# Patient Record
Sex: Male | Born: 1970 | Race: Black or African American | Hispanic: No | Marital: Single | Smoking: Former smoker
Health system: Southern US, Community
[De-identification: ages and names within clinical notes are randomized; demographics above are authoritative.]

## PROBLEM LIST (undated history)

## (undated) DIAGNOSIS — Z91199 Patient's noncompliance with other medical treatment and regimen due to unspecified reason: Secondary | ICD-10-CM

## (undated) DIAGNOSIS — I10 Essential (primary) hypertension: Secondary | ICD-10-CM

## (undated) DIAGNOSIS — I639 Cerebral infarction, unspecified: Secondary | ICD-10-CM

## (undated) DIAGNOSIS — N184 Chronic kidney disease, stage 4 (severe): Secondary | ICD-10-CM

## (undated) DIAGNOSIS — E119 Type 2 diabetes mellitus without complications: Secondary | ICD-10-CM

## (undated) HISTORY — PX: OTHER SURGICAL HISTORY: SHX169

## (undated) HISTORY — PX: AMPUTATION TOE: SHX6595

## (undated) HISTORY — PX: DG GREAT TOE RIGHT FOOT: HXRAD1657

---

## 2016-02-19 HISTORY — PX: ROTATOR CUFF REPAIR: SHX139

## 2021-02-17 ENCOUNTER — Emergency Department (HOSPITAL_COMMUNITY): Payer: Medicare HMO

## 2021-02-17 ENCOUNTER — Inpatient Hospital Stay (HOSPITAL_COMMUNITY)
Admission: EM | Admit: 2021-02-17 | Discharge: 2021-02-23 | DRG: 064 | Disposition: A | Discharge status: Rehabilitation Facility | Payer: Medicare HMO | Attending: Neurology | Admitting: Neurology

## 2021-02-17 DIAGNOSIS — I10 Essential (primary) hypertension: Secondary | ICD-10-CM | POA: Diagnosis present

## 2021-02-17 DIAGNOSIS — I619 Nontraumatic intracerebral hemorrhage, unspecified: Secondary | ICD-10-CM | POA: Diagnosis present

## 2021-02-17 DIAGNOSIS — I672 Cerebral atherosclerosis: Secondary | ICD-10-CM | POA: Diagnosis present

## 2021-02-17 DIAGNOSIS — I161 Hypertensive emergency: Secondary | ICD-10-CM

## 2021-02-17 DIAGNOSIS — E785 Hyperlipidemia, unspecified: Secondary | ICD-10-CM | POA: Diagnosis present

## 2021-02-17 DIAGNOSIS — R29711 NIHSS score 11: Secondary | ICD-10-CM | POA: Diagnosis present

## 2021-02-17 DIAGNOSIS — I611 Nontraumatic intracerebral hemorrhage in hemisphere, cortical: Secondary | ICD-10-CM | POA: Diagnosis present

## 2021-02-17 DIAGNOSIS — R4781 Slurred speech: Secondary | ICD-10-CM | POA: Diagnosis present

## 2021-02-17 DIAGNOSIS — G8194 Hemiplegia, unspecified affecting left nondominant side: Secondary | ICD-10-CM | POA: Diagnosis present

## 2021-02-17 DIAGNOSIS — I610 Nontraumatic intracerebral hemorrhage in hemisphere, subcortical: Principal | ICD-10-CM | POA: Diagnosis present

## 2021-02-17 DIAGNOSIS — I618 Other nontraumatic intracerebral hemorrhage: Secondary | ICD-10-CM | POA: Diagnosis not present

## 2021-02-17 DIAGNOSIS — E118 Type 2 diabetes mellitus with unspecified complications: Secondary | ICD-10-CM | POA: Diagnosis present

## 2021-02-17 DIAGNOSIS — Z20822 Contact with and (suspected) exposure to covid-19: Secondary | ICD-10-CM | POA: Diagnosis present

## 2021-02-17 DIAGNOSIS — R29810 Facial weakness: Secondary | ICD-10-CM | POA: Diagnosis present

## 2021-02-17 DIAGNOSIS — Z8673 Personal history of transient ischemic attack (TIA), and cerebral infarction without residual deficits: Secondary | ICD-10-CM

## 2021-02-17 DIAGNOSIS — I69152 Hemiplegia and hemiparesis following nontraumatic intracerebral hemorrhage affecting left dominant side: Secondary | ICD-10-CM | POA: Diagnosis not present

## 2021-02-17 DIAGNOSIS — G936 Cerebral edema: Secondary | ICD-10-CM | POA: Diagnosis present

## 2021-02-17 HISTORY — DX: Essential (primary) hypertension: I10

## 2021-02-17 HISTORY — DX: Cerebral infarction, unspecified: I63.9

## 2021-02-17 HISTORY — DX: Type 2 diabetes mellitus without complications: E11.9

## 2021-02-17 HISTORY — DX: Patient's noncompliance with other medical treatment and regimen due to unspecified reason: Z91.199

## 2021-02-17 HISTORY — DX: Chronic kidney disease, stage 4 (severe): N18.4

## 2021-02-17 LAB — CBC
HCT: 42 % (ref 39.0–52.0)
Hemoglobin: 14.1 g/dL (ref 13.0–17.0)
MCH: 31.4 pg (ref 26.0–34.0)
MCHC: 33.6 g/dL (ref 30.0–36.0)
MCV: 93.5 fL (ref 80.0–100.0)
Platelets: 316 10*3/uL (ref 150–400)
RBC: 4.49 MIL/uL (ref 4.22–5.81)
RDW: 11.9 % (ref 11.5–15.5)
WBC: 6.2 10*3/uL (ref 4.0–10.5)
nRBC: 0 % (ref 0.0–0.2)

## 2021-02-17 LAB — RESP PANEL BY RT-PCR (FLU A&B, COVID) ARPGX2
Influenza A by PCR: NEGATIVE
Influenza B by PCR: NEGATIVE
SARS Coronavirus 2 by RT PCR: NEGATIVE

## 2021-02-17 LAB — RAPID URINE DRUG SCREEN, HOSP PERFORMED
Amphetamines: NOT DETECTED
Barbiturates: NOT DETECTED
Benzodiazepines: NOT DETECTED
Cocaine: NOT DETECTED
Opiates: NOT DETECTED
Tetrahydrocannabinol: NOT DETECTED

## 2021-02-17 LAB — DIFFERENTIAL
Abs Immature Granulocytes: 0.02 10*3/uL (ref 0.00–0.07)
Basophils Absolute: 0 10*3/uL (ref 0.0–0.1)
Basophils Relative: 0 %
Eosinophils Absolute: 0 10*3/uL (ref 0.0–0.5)
Eosinophils Relative: 0 %
Immature Granulocytes: 0 %
Lymphocytes Relative: 18 %
Lymphs Abs: 1.1 10*3/uL (ref 0.7–4.0)
Monocytes Absolute: 0.4 10*3/uL (ref 0.1–1.0)
Monocytes Relative: 6 %
Neutro Abs: 4.7 10*3/uL (ref 1.7–7.7)
Neutrophils Relative %: 76 %

## 2021-02-17 LAB — ETHANOL: Alcohol, Ethyl (B): <10 mg/dL (ref <10)

## 2021-02-17 LAB — COMPREHENSIVE METABOLIC PANEL
ALT: 13 U/L (ref 0–44)
AST: 21 U/L (ref 15–41)
Albumin: 3.9 g/dL (ref 3.5–5.0)
Alkaline Phosphatase: 50 U/L (ref 38–126)
Anion gap: 8 (ref 5–15)
BUN: 47 mg/dL — ABNORMAL HIGH (ref 6–20)
CO2: 27 mmol/L (ref 22–32)
Calcium: 9.1 mg/dL (ref 8.9–10.3)
Chloride: 103 mmol/L (ref 98–111)
Creatinine, Ser: 4.58 mg/dL — ABNORMAL HIGH (ref 0.61–1.24)
GFR, Estimated: 15 mL/min — ABNORMAL LOW (ref >60)
Glucose, Bld: 130 mg/dL — ABNORMAL HIGH (ref 70–99)
Potassium: 3.8 mmol/L (ref 3.5–5.1)
Sodium: 138 mmol/L (ref 135–145)
Total Bilirubin: 1 mg/dL (ref 0.3–1.2)
Total Protein: 7.7 g/dL (ref 6.5–8.1)

## 2021-02-17 LAB — I-STAT CHEM 8, ED
BUN: 49 mg/dL — ABNORMAL HIGH (ref 6–20)
Calcium, Ion: 1.15 mmol/L (ref 1.15–1.40)
Chloride: 104 mmol/L (ref 98–111)
Creatinine, Ser: 4.4 mg/dL — ABNORMAL HIGH (ref 0.61–1.24)
Glucose, Bld: 121 mg/dL — ABNORMAL HIGH (ref 70–99)
HCT: 42 % (ref 39.0–52.0)
Hemoglobin: 14.3 g/dL (ref 13.0–17.0)
Potassium: 3.9 mmol/L (ref 3.5–5.1)
Sodium: 140 mmol/L (ref 135–145)
TCO2: 28 mmol/L (ref 22–32)

## 2021-02-17 LAB — APTT: aPTT: 27 seconds (ref 24–36)

## 2021-02-17 LAB — MRSA NEXT GEN BY PCR, NASAL: MRSA by PCR Next Gen: NOT DETECTED

## 2021-02-17 LAB — PROTIME-INR
INR: 0.8 (ref 0.8–1.2)
Prothrombin Time: 11.4 seconds (ref 11.4–15.2)

## 2021-02-17 LAB — CBG MONITORING, ED: Glucose-Capillary: 126 mg/dL — ABNORMAL HIGH (ref 70–99)

## 2021-02-17 MED ORDER — ACETAMINOPHEN 650 MG RE SUPP: 650.0000 mg | RECTAL | Status: DC | PRN

## 2021-02-17 MED ORDER — PANTOPRAZOLE SODIUM 40 MG IV SOLR: 40.0000 mg | Freq: Every day | INTRAVENOUS | Status: DC

## 2021-02-17 MED ORDER — ACETAMINOPHEN 160 MG/5ML PO SOLN: 650.0000 mg | ORAL | Status: DC | PRN

## 2021-02-17 MED ORDER — CLEVIDIPINE BUTYRATE 0.5 MG/ML IV EMUL
0.0000 mg/h | INTRAVENOUS | Status: DC
  Administered 2021-02-17: 1 mg/h via INTRAVENOUS
  Administered 2021-02-17: 22:00:00 21 mg/h via INTRAVENOUS
  Administered 2021-02-17: 18 mg/h via INTRAVENOUS
  Administered 2021-02-18: 01:00:00 14 mg/h via INTRAVENOUS
  Administered 2021-02-18: 09:00:00 8 mg/h via INTRAVENOUS
  Administered 2021-02-18: 13:00:00 12 mg/h via INTRAVENOUS
  Administered 2021-02-18: 10 mg/h via INTRAVENOUS
  Administered 2021-02-18: 16:00:00 7 mg/h via INTRAVENOUS
  Administered 2021-02-18: 12:00:00 12 mg/h via INTRAVENOUS
  Administered 2021-02-18: 14 mg/h via INTRAVENOUS
  Administered 2021-02-18: 19:00:00 6 mg/h via INTRAVENOUS
  Administered 2021-02-18: 14 mg/h via INTRAVENOUS
  Administered 2021-02-19 (×2): 5 mg/h via INTRAVENOUS
  Administered 2021-02-19 – 2021-02-20 (×3): 3 mg/h via INTRAVENOUS
  Administered 2021-02-20: 6 mg/h via INTRAVENOUS
  Filled 2021-02-17 (×19): qty 50

## 2021-02-17 MED ORDER — LABETALOL HCL 5 MG/ML IV SOLN
10.0000 mg | Freq: Once | INTRAVENOUS | Status: AC
  Administered 2021-02-17: 10 mg via INTRAVENOUS

## 2021-02-17 MED ORDER — ACETAMINOPHEN 325 MG PO TABS: 650.0000 mg | ORAL_TABLET | ORAL | Status: DC | PRN

## 2021-02-17 MED ORDER — SENNOSIDES-DOCUSATE SODIUM 8.6-50 MG PO TABS
1.0000 | ORAL_TABLET | Freq: Two times a day (BID) | ORAL | Status: DC
  Administered 2021-02-21 (×2): 1 via ORAL
  Filled 2021-02-17 (×5): qty 1

## 2021-02-17 MED ORDER — STROKE: EARLY STAGES OF RECOVERY BOOK
Freq: Once | Status: AC
  Administered 2021-02-18: 1
  Filled 2021-02-17: qty 1

## 2021-02-17 MED ORDER — SODIUM CHLORIDE 0.9% FLUSH: 3.0000 mL | Freq: Once | INTRAVENOUS | Status: DC

## 2021-02-17 MED ORDER — CLEVIDIPINE BUTYRATE 0.5 MG/ML IV EMUL: 0.0000 mg/h | INTRAVENOUS | Status: DC

## 2021-02-17 NOTE — ED Triage Notes (Signed)
Pt in as code stroke via [place], [place]. Per EMS, pt has hx of multiple CVA's (all care in other states), hx R side deficits. EMS states a bystander saw the pt lose balance and fall out of truck tonight. They called EMS, and pt had L sided weakness and L face droop, with slurred speech. Pt has HTN, but does not take any medications. BP 283'T systolic en route. 18G to LFA

## 2021-02-17 NOTE — ED Notes (Signed)
Report given via phone to [place], RN 4N

## 2021-02-17 NOTE — ED Provider Notes (Signed)
[facility] EMERGENCY DEPARTMENT Provider Note   CSN: [id number] Arrival date & time: 02/17/21  1934     History Chief Complaint  Patient presents with   Code Stroke    Carlos Rice is a 50 y.o. male.  HPI 50 year old male presents as a code stroke.  Initial history is obtained from EMS due to the code stroke nature.  Patient reportedly had a last known well of 6:30 PM.  Was in his car and felt off balance.  Utility worker saw him fall out of the car.  He felt acutely weak on his left side and was having difficulty speaking.  He states its like prior strokes and told EMS he has had 7 prior strokes.  Initial blood pressure around 250/160 and EMS reports the patient states that he does not take his blood pressure medicines or any medicines.  Patient tells me he primarily feels off balance. He denies unilateral weakness. He is having new slurred speech. He states he weaned himself off blood pressure medicines and uses "holistic" treatments.   No past medical history on file.  Patient Active Problem List   Diagnosis Date Noted   ICH (intracerebral hemorrhage) ([place]) 02/17/2021         No family history on file.     Home Medications Prior to Admission medications   Not on File    Allergies    Patient has no known allergies.  Review of Systems   Review of Systems  Musculoskeletal:  Positive for gait problem.  Neurological:  Positive for speech difficulty. Negative for weakness and headaches.  All other systems reviewed and are negative.  Physical Exam Updated Vital Signs BP (!) 221/117    Pulse 78    Temp 97.9 F (36.6 C)    Resp 18    Wt 85.4 kg    SpO2 100%   Physical Exam Vitals and nursing note reviewed.  Constitutional:      General: He is not in acute distress.    Appearance: He is well-developed. He is not ill-appearing or diaphoretic.  HENT:     Head: Normocephalic and atraumatic.     Right Ear: External ear normal.     Left Ear:  External ear normal.     Nose: Nose normal.  Eyes:     General:        Right eye: No discharge.        Left eye: No discharge.  Cardiovascular:     Rate and Rhythm: Normal rate and regular rhythm.     Heart sounds: Normal heart sounds.  Pulmonary:     Effort: Pulmonary effort is normal.     Breath sounds: Normal breath sounds.  Abdominal:     Palpations: Abdomen is soft.     Tenderness: There is no abdominal tenderness.  Musculoskeletal:     Cervical back: Neck supple.  Skin:    General: Skin is warm and dry.  Neurological:     Mental Status: He is alert.     Comments: Patient has slurred speech and a mild left facial droop.  Left upper extremity is weak compared to the right, 4/5.  Right upper, right lower and left lower extremity are all normal and strength.  Psychiatric:        Mood and Affect: Mood is not anxious.    ED Results / Procedures / Treatments   Labs (all labs ordered are listed, but only abnormal results are displayed) Labs Reviewed  I-STAT  CHEM 8, ED - Abnormal; Notable for the following components:      Result Value   BUN 49 (*)    Creatinine, Ser 4.40 (*)    Glucose, Bld 121 (*)    All other components within normal limits  CBG MONITORING, ED - Abnormal; Notable for the following components:   Glucose-Capillary 126 (*)    All other components within normal limits  RESP PANEL BY RT-PCR (FLU A&B, COVID) ARPGX2  CBC  DIFFERENTIAL  PROTIME-INR  APTT  COMPREHENSIVE METABOLIC PANEL  ETHANOL  HIV ANTIBODY (ROUTINE TESTING W REFLEX)  RAPID URINE DRUG SCREEN, HOSP PERFORMED    EKG None  Radiology CT HEAD CODE STROKE WO CONTRAST  Result Date: 02/17/2021 CLINICAL DATA:  Code stroke. Slurred speech and left-sided weakness. EXAM: CT HEAD WITHOUT CONTRAST TECHNIQUE: Contiguous axial images were obtained from the base of the skull through the vertex without intravenous contrast. COMPARISON:  None. FINDINGS: The study is mildly motion degraded. Brain: An  acute hemorrhage involving the right lentiform nucleus and external capsule measures 3.0 x 1.3 x 2.5 cm (estimated volume of 4.9 mL). There is minimal surrounding edema. No midline shift, acute cortically based infarct, or extra-axial fluid collection is identified. Patchy hypodensities in the cerebral white matter bilaterally are nonspecific but compatible with moderate chronic small vessel ischemic disease. The ventricles are normal in size. Vascular: Calcified atherosclerosis at the skull base. No hyperdense vessel. Skull: No fracture or suspicious osseous lesion. Sinuses/Orbits: Posterior left ethmoid air cell opacification. Clear mastoid air cells. Unremarkable orbits. Other: None. ASPECTS [facility] Stroke Program Early CT Score) Not scored due to the presence of acute hemorrhage. IMPRESSION: 1. Acute right basal ganglia hemorrhage. 2. Moderate chronic small vessel ischemic disease. These results were communicated to Dr. Cheral Marker at 8:00 pm on 02/17/2021 by text page via the [facility] messaging system. Electronically Signed   By: Logan Bores M.D.   On: 02/17/2021 20:01    Procedures .Critical Care Performed by: Sherwood Gambler, MD Authorized by: Sherwood Gambler, MD   Critical care provider statement:    Critical care time (minutes):  35   Critical care time was exclusive of:  Separately billable procedures and treating other patients   Critical care was necessary to treat or prevent imminent or life-threatening deterioration of the following conditions:  CNS failure or compromise   Critical care was time spent personally by me on the following activities:  Development of treatment plan with patient or surrogate, discussions with consultants, evaluation of patient's response to treatment, examination of patient, ordering and review of laboratory studies, ordering and review of radiographic studies, ordering and performing treatments and interventions, pulse oximetry, re-evaluation of patient's condition  and review of old charts   Medications Ordered in ED Medications  sodium chloride flush (NS) 0.9 % injection 3 mL (has no administration in time range)  labetalol (NORMODYNE) injection 10 mg (10 mg Intravenous Given 02/17/21 1933)    And  clevidipine (CLEVIPREX) infusion 0.5 mg/mL (4 mg/hr Intravenous Rate/Dose Change 02/17/21 2012)   stroke: mapping our early stages of recovery book (has no administration in time range)  acetaminophen (TYLENOL) tablet 650 mg (has no administration in time range)    Or  acetaminophen (TYLENOL) 160 MG/5ML solution 650 mg (has no administration in time range)    Or  acetaminophen (TYLENOL) suppository 650 mg (has no administration in time range)  senna-docusate (Senokot-S) tablet 1 tablet (has no administration in time range)  pantoprazole (PROTONIX) injection 40 mg (has no  administration in time range)    ED Course  I have reviewed the triage vital signs and the nursing notes.  Pertinent labs & imaging results that were available during my care of the patient were reviewed by me and considered in my medical decision making (see chart for details).    MDM Rules/Calculators/A&P                         Patient is awake and alert and protecting his airway.  CT head shows intraparenchymal hemorrhage of the basal ganglia.  This appears to be due to hypertension.  He was started on Cleviprex.  He has some left upper extremity weakness and some slurred speech.  Blood pressure has improved though is still elevated and he will need further treatment and close monitoring in the intensive care unit.  He does have some lab abnormalities including an elevated creatinine of 4.4, though his baseline is currently unknown as he has not been in our system before.  Dr. Cheral Marker was a part of the code stroke team and will admit to the neuro ICU.    Final Clinical Impression(s) / ED Diagnoses Final diagnoses:  Intraparenchymal hemorrhage of brain [facility])  Hypertensive  emergency    Rx / DC Orders ED Discharge Orders     None        Sherwood Gambler, MD 02/17/21 2102

## 2021-02-17 NOTE — H&P (Addendum)
NEURO HOSPITALIST HISTORY AND PHYSICAL   Requesting physician: Dr. Regenia Skeeter  Reason for Consult: Acute onset of left sided weakness  History obtained from:   Patient and Chart     HPI:                                                                                                                                          Carlos Rice is an 50 y.o. male with a PMH of uncontrolled HTN (noncompliant with medications), right toe amputation, prior stroke with right sided weakness in 2016 who presents to the hospital via EMS as a Code Stroke after he was noted to acutely be weak on his left side following a fall out of his car. The patient states that while getting out of his car, he suddenly felt unsteady and then was witnessed to fall to the ground. He states that when he tried to get back up, he was too weak on his left side to do so. EMS was called and he was brought to the ED. He was severely hypertensive en route, with a BP of 250/146. CBG was 147.   The patient states that when he was diagnosed with his first clinically apparent stroke in 2016, he was told that his MRI showed "7 strokes" including the new one that he presented with at that time.   He is not on any medications.   No past medical history on file.  No family history on file.           Social History:  has no history on file for tobacco use, alcohol use, and drug use.  Not on File  MEDICATIONS:                                                                                                                     Not on any medications  ROS:  As per HPI. Comprehensive ROS deferred due to acuity of presentation.    Weight 85.4 kg.  BP (!) 221/117    Pulse 78    Resp 18    Wt 85.4 kg    SpO2 100%     General Examination:                                                                                                        Physical Exam  HEENT-  [place]/AT   Lungs- Respirations unlabored Extremities- Toe amputation on the right. Sores noted to legs bilaterally.   Neurological Examination Mental Status: Awake and alert. No dysarthria. Speech fluent with intact naming and comprehension. Oriented x 5.  Cranial Nerves:  II: Temporal visual fields intact bilaterally. No extinction to DSS. PERRL. III,IV, VI: No ptosis. EOMI.   V: Temp sensation equal bilaterally VII: Left facial droop, lower quadrant.  VIII: Hearing intact to voice IX,X: No hypophonia or hoarseness XI: Weak shoulder shrug on the left XII: Midline tongue extension Motor: RUE: 5/5 proximally and distally RLE:5/5 proximally and distally LUE:3-4/5 proximally and distally LLE: 5/5 proximally and distally  Pronator drift is present on the left.  Sensory: FT intact in all 4 extremities. No extinction to DSS. Deep Tendon Reflexes: Hyperactive upper and lower extremity reflexes Plantars: M[place] on the left, amputated toe on the right Cerebellar: No ataxia with right FNF. Prominent dysmetria with left FNF. No ataxia with H-S bilaterally Gait: Deferred   Lab Results: Basic Metabolic Panel: No results for input(s): NA, K, CL, CO2, GLUCOSE, BUN, CREATININE, CALCIUM, MG, PHOS in the last 168 hours.  CBC: No results for input(s): WBC, NEUTROABS, HGB, HCT, MCV, PLT in the last 168 hours.  Cardiac Enzymes: No results for input(s): CKTOTAL, CKMB, CKMBINDEX, TROPONINI in the last 168 hours.  Lipid Panel: No results for input(s): CHOL, TRIG, HDL, CHOLHDL, VLDL, LDLCALC in the last 168 hours.  Imaging: No results found.   Assessment: 50 year old male with a PMHx of stroke, presenting acutely via EMS for assessment of abrupt onset left sided weakness 1. Exam reveals findings best localizable to the right MCA territory.  2. CT head reveals an acute hemorrhage involving the right lentiform nucleus and  external capsule measuring 3.0 x 1.3 x 2.5 cm (estimated volume of 4.9 mL). There is minimal surrounding edema. Patchy hypodensities in the cerebral white matter bilaterally are nonspecific but compatible with moderate chronic small vessel ischemic disease 3. ICH score: 0 4. Malignant HTN. The morphology and location of the [place] is most consistent with a hypertensive hemorrhage.   Recommendations: 1. Admit to ICU under Neurology service 2. MRI of head 3. CTA of head and neck 4. TTE 5. PT consult, OT consult, Speech consult 6. Cardiac telemetry 7. Frequent neuro checks 8. No antiplatelet medications or anticoagulants.  9. Repeat CT head in 12 hours to assess for stability 10. BP management with clevidipine drip   50 minutes spent in the emergent neurological evaluation and management of this critically ill patient.   Electronically signed: Dr. Kerney Elbe  02/17/2021, 7:45 PM

## 2021-02-17 NOTE — Progress Notes (Signed)
Spoke with Dr. Cheral Marker to clarify blood pressure parameters. Dr. Cheral Marker said parameters are SBP 130-150. Will continue to monitor.

## 2021-02-18 ENCOUNTER — Inpatient Hospital Stay (HOSPITAL_COMMUNITY): Payer: Medicare HMO

## 2021-02-18 ENCOUNTER — Other Ambulatory Visit: Payer: Self-pay

## 2021-02-18 DIAGNOSIS — I618 Other nontraumatic intracerebral hemorrhage: Secondary | ICD-10-CM

## 2021-02-18 LAB — LIPID PANEL
Cholesterol: 251 mg/dL — ABNORMAL HIGH (ref 0–200)
HDL: 104 mg/dL (ref >40)
LDL Cholesterol: 131 mg/dL — ABNORMAL HIGH (ref 0–99)
Total CHOL/HDL Ratio: 2.4 RATIO
Triglycerides: 82 mg/dL (ref <150)
VLDL: 16 mg/dL (ref 0–40)

## 2021-02-18 LAB — VITAMIN B12: Vitamin B-12: 901 pg/mL (ref 180–914)

## 2021-02-18 LAB — HIV ANTIBODY (ROUTINE TESTING W REFLEX): HIV Screen 4th Generation wRfx: NONREACTIVE

## 2021-02-18 LAB — IRON AND TIBC
Iron: 66 ug/dL (ref 45–182)
Saturation Ratios: 19 % (ref 17.9–39.5)
TIBC: 350 ug/dL (ref 250–450)
UIBC: 284 ug/dL

## 2021-02-18 LAB — HEMOGLOBIN A1C
Hgb A1c MFr Bld: 6.8 % — ABNORMAL HIGH (ref 4.8–5.6)
Mean Plasma Glucose: 148.46 mg/dL

## 2021-02-18 LAB — FERRITIN: Ferritin: 21 ng/mL — ABNORMAL LOW (ref 24–336)

## 2021-02-18 IMAGING — MR MR HEAD W/O CM
12 of 13 series · 39 of 48 positions shown · non-contrast
Comparison: Head CT [DATE]

CLINICAL DATA: Neuro deficit, acute, stroke suspected. Acute
left-sided weakness. Right basal ganglia hemorrhage on CT.



[Series 5: DWI · axial · 3.0mm · 0.88mm/px · z∈[-97,+44]mm · 6 of 96 slices shown (1 of 4)]
[im 1/96]
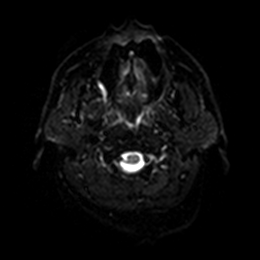
[im 20/96]
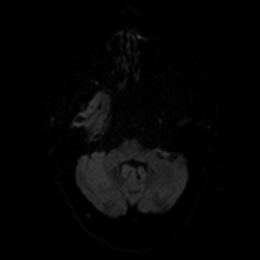
[im 39/96]
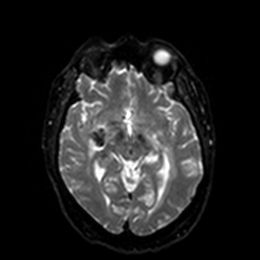
[im 58/96]
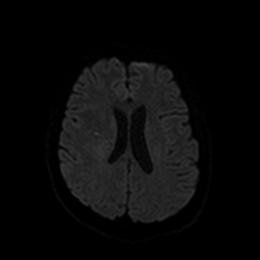
[im 77/96]
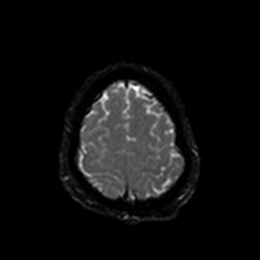
[im 96/96]
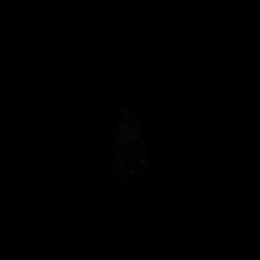

[Series 6: DWI · axial · 3.0mm · 0.88mm/px · z∈[-97,+44]mm · 4 of 48 slices shown (2 of 4)]
[im 1/48]
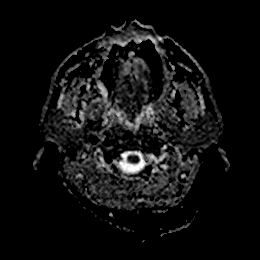
[im 16/48]
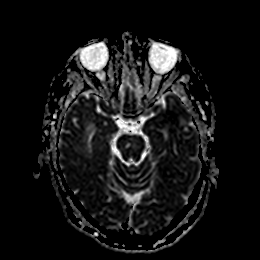
[im 32/48]
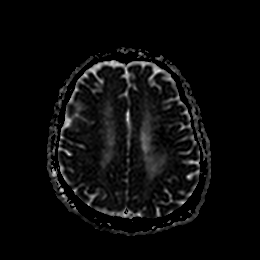
[im 48/48]
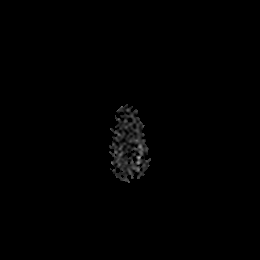

[Series 7: DWI · coronal · 4.0mm · 0.88mm/px · 5 of 64 slices shown (3 of 4)]
[im 1/64]
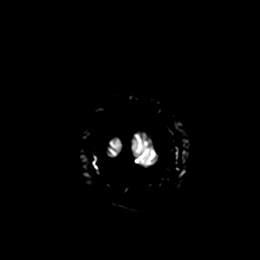
[im 16/64]
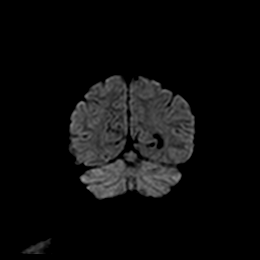
[im 32/64]
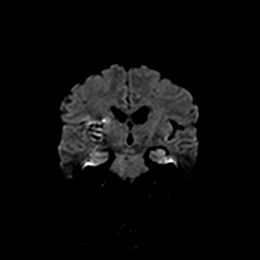
[im 48/64]
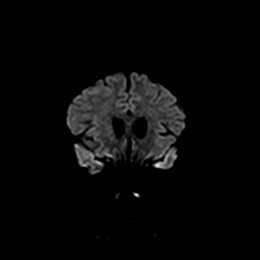
[im 64/64]
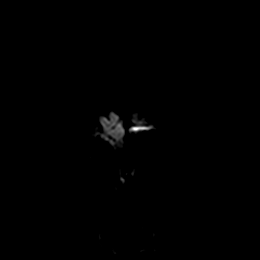

[Series 8: DWI · coronal · 4.0mm · 0.88mm/px · 2 of 32 slices shown (4 of 4)]
[im 1/32]
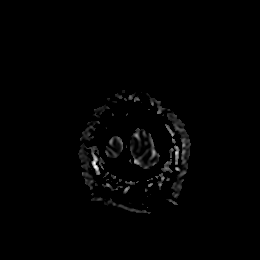
[im 32/32]
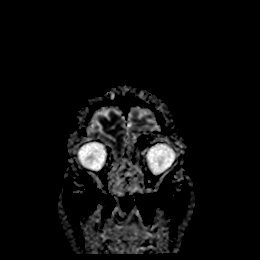

[Series 13: T1 · sagittal · 5.0mm · 0.75mm/px · 2 of 23 slices shown]
[im 1/23]
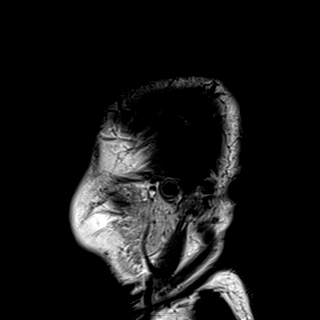
[im 23/23]
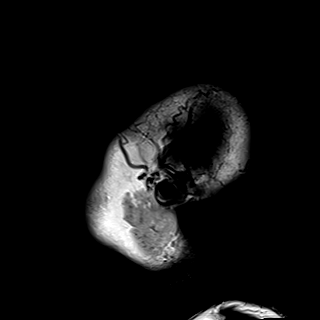

[Series 14: T2 · axial · 5.0mm · 0.72mm/px · z∈[-100,+37]mm · 2 of 24 slices shown (1 of 2)]
[im 1/24]
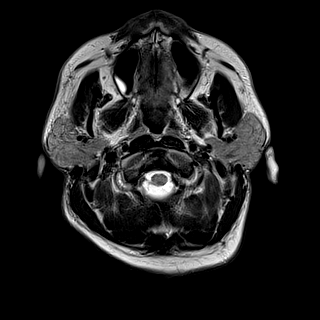
[im 24/24]
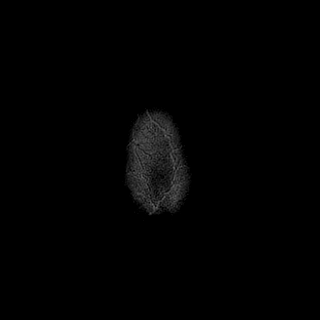

[Series 15: FLAIR · axial · 5.0mm · 0.45mm/px · z∈[-98,+39]mm · 2 of 24 slices shown (1 of 2)]
[im 1/24]
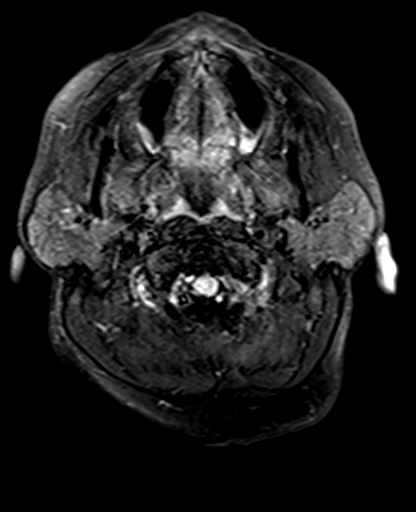
[im 24/24]
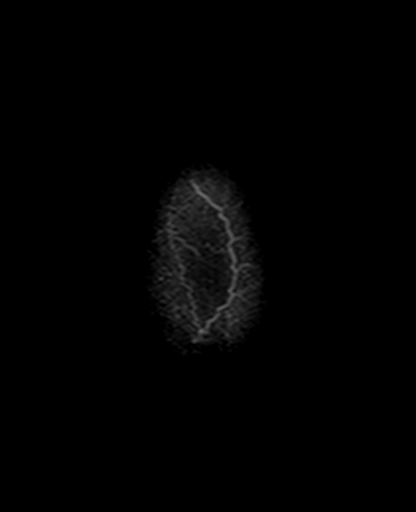

[Series 16: mag_images · axial · 3.0mm · 0.90mm/px · z∈[-100,+40]mm · 4 of 48 slices shown]
[im 1/48]
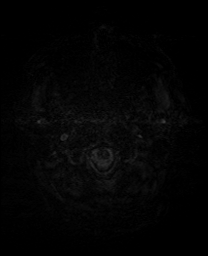
[im 16/48]
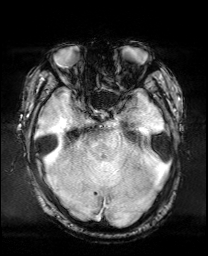
[im 32/48]
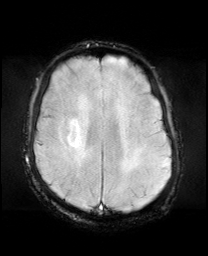
[im 48/48]
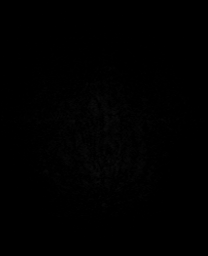

[Series 17: pha_images · axial · 3.0mm · 0.90mm/px · z∈[-100,+37]mm · 4 of 47 slices shown]
[im 1/47]
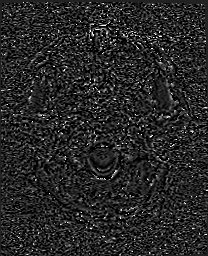
[im 16/47]
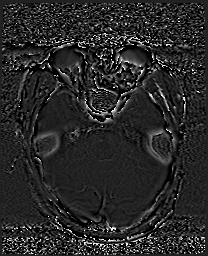
[im 31/47]
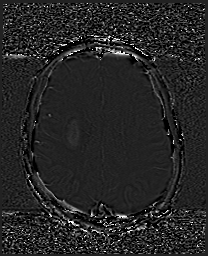
[im 47/47]
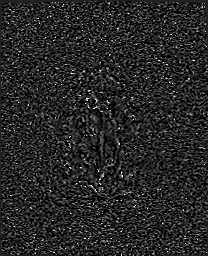

[Series 18: swi_images · axial · 3.0mm · 0.90mm/px · z∈[-100,+40]mm · 4 of 48 slices shown]
[im 1/48]
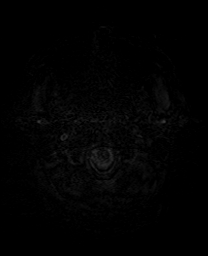
[im 16/48]
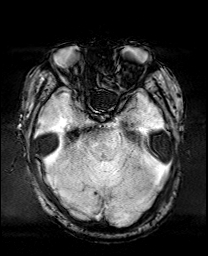
[im 32/48]
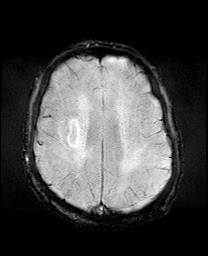
[im 48/48]
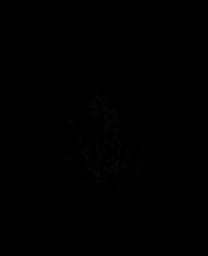

[Series 21: T2 · coronal · 5.0mm · 0.72mm/px · 2 of 27 slices shown (2 of 2)]
[im 1/27]
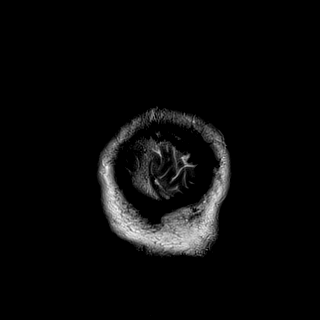
[im 27/27]
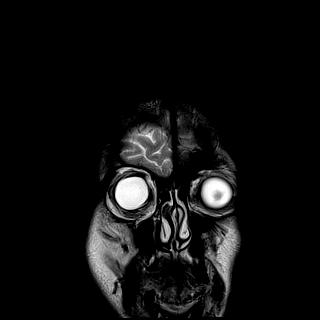

[Series 22: FLAIR · axial · 5.0mm · 0.90mm/px · z∈[-100,+37]mm · 2 of 24 slices shown (2 of 2)]
[im 1/24]
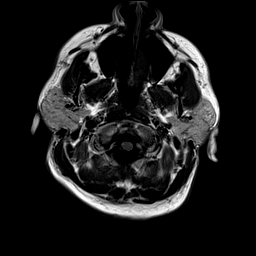
[im 24/24]
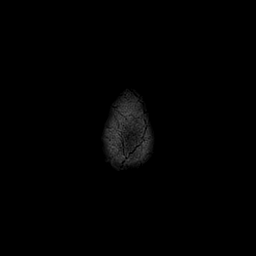

[39 of 48 positions shown; findings below may reference images not displayed]

FINDINGS: MRI HEAD FINDINGS

Brain: A 3.0 x 1.5 cm hemorrhage involving the right lentiform
nucleus and external capsule is similar in size to the recent CT.
There is mild surrounding edema with very mild local mass effect
including slight mass effect on the right lateral ventricle but no
midline shift. A separate 2 mm focus of restricted diffusion
posterolateral to the hemorrhage in the right parietal operculum may
represent a punctate acute infarct. Patchy T2 hyperintensities in
the cerebral white matter bilaterally and in the pons are
nonspecific but compatible with moderate chronic small vessel
ischemic disease. There are chronic lacunar infarcts in the basal
ganglia, left thalamus, and pons. No extra-axial fluid collection or
hydrocephalus is evident. Chronic microhemorrhages are noted in the
pons and right occipital lobe.

Vascular: Major intracranial vascular flow voids are preserved.

Skull and upper cervical spine: Unremarkable bone marrow signal.

Sinuses/Orbits: Unremarkable orbits. Posterior left ethmoid air cell
opacification. No significant mastoid fluid.

Other: None.

MRA HEAD FINDINGS

The included intracranial portions of the vertebral arteries are
widely patent to the basilar with the left being dominant. A patent
left PICA is partially visualized although its origin was not
imaged. A right PICA is not identified. Patent AICA and SCA origins
are seen bilaterally. The basilar artery is widely patent. Posterior
communicating arteries are diminutive or absent. Both PCAs are
patent without evidence of significant proximal stenosis.

The internal carotid arteries are patent from skull base to carotid
termini with a moderate paraclinoid stenosis noted on the right.
ACAs and MCAs are patent with mild branch vessel irregularity but
without evidence of a proximal branch occlusion or significant A1 or
M1 stenosis. The right A1 segment is absent. There is a moderate
stenosis of the right M2 superior division proximally. No aneurysm
is identified.

MRA NECK FINDINGS

Assessment is limited by noncontrast technique and mild motion
artifact. The aortic arch and proximal arch vessels were not imaged.
The included portions of the common carotid and cervical internal
carotid arteries are patent without evidence of a significant
stenosis.

The vertebral arteries are patent with antegrade flow bilaterally
and with the left vertebral artery being mildly to moderately
dominant. Motion artifact limits assessment of the right vertebral
artery origin, and a stenosis is not excluded. There is no evidence
of significant stenosis or dissection elsewhere involving either
vertebral artery in the neck.
IMPRESSION: 1. Unchanged 3 cm right basal ganglia hemorrhage.  Mild edema.
2. Possible nearby punctate acute infarct in the right parietal
operculum.
3. Moderate chronic small vessel ischemic disease with multiple
chronic lacunar infarcts.
4. Intracranial atherosclerosis with moderate right paraclinoid ICA
and right M2 stenoses. No large vessel occlusion.
5. Patent cervical carotid and vertebral arteries without evidence
of a significant stenosis other than at the right vertebral origin
which is not well evaluated due to motion.

## 2021-02-18 IMAGING — MR MR MRA NECK W/O CM
1 series · 36 of 48 positions shown · non-contrast
Comparison: Head CT [DATE]

CLINICAL DATA: Neuro deficit, acute, stroke suspected. Acute
left-sided weakness. Right basal ganglia hemorrhage on CT.



[Series 14: [id number]_iso · axial · 0.6mm · 0.52mm/px · z∈[-247,-119]mm · 36 of 226 slices shown]
[im 1/226]
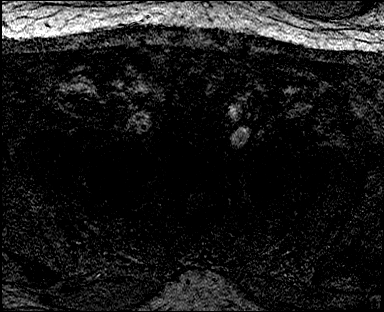
[im 5/226]
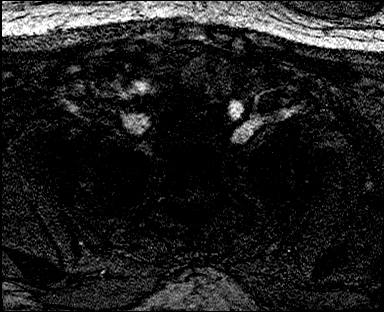
[im 10/226]
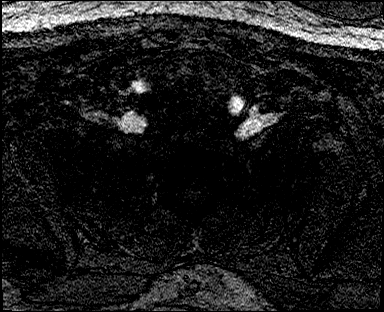
[im 15/226]
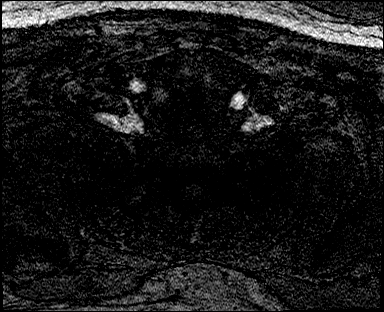
[im 20/226]
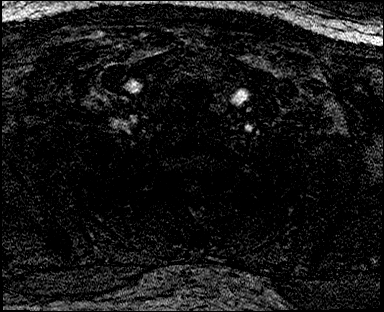
[im 24/226]
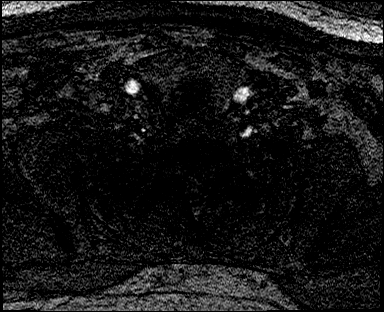
[im 29/226]
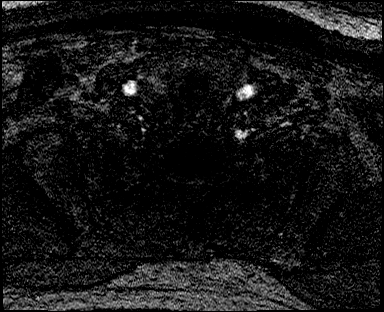
[im 34/226]
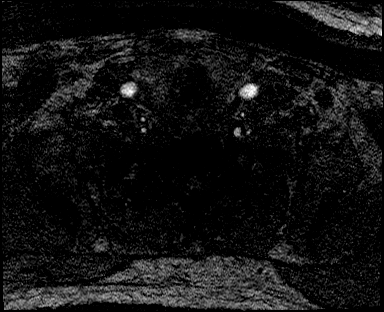
[im 39/226]
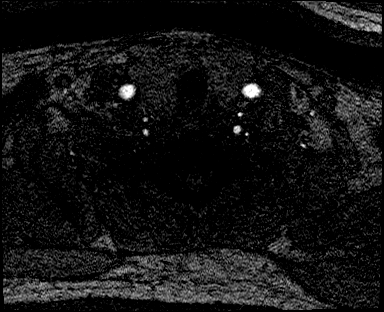
[im 44/226]
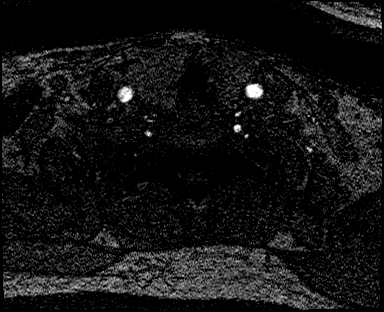
[im 48/226]
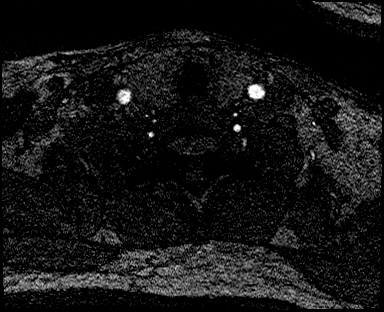
[im 53/226]
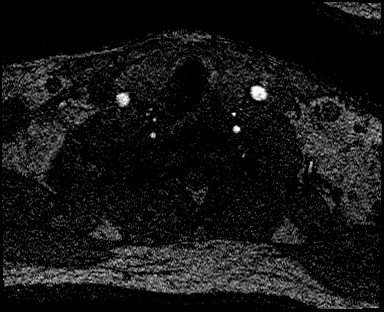
[im 58/226]
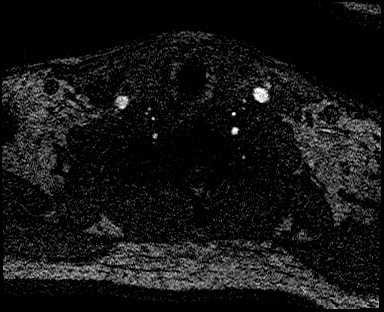
[im 63/226]
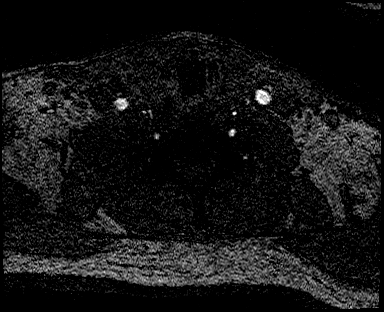
[im 68/226]
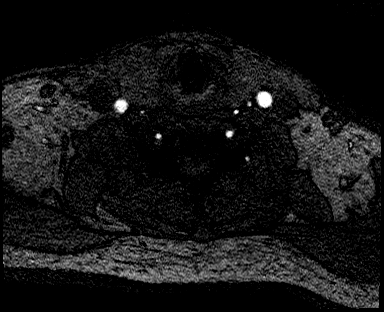
[im 72/226]
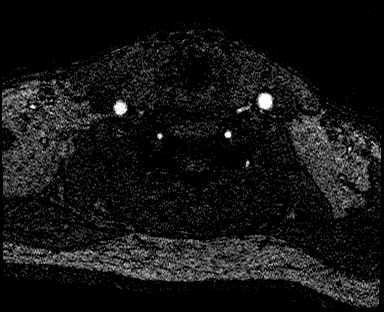
[im 77/226]
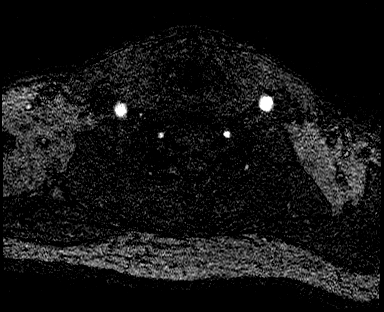
[im 82/226]
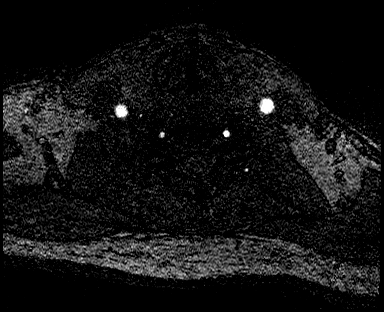
[im 87/226]
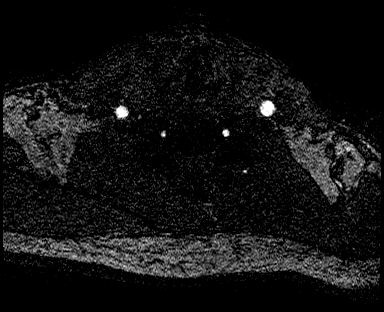
[im 91/226]
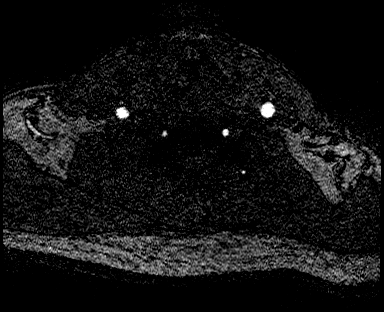
[im 96/226]
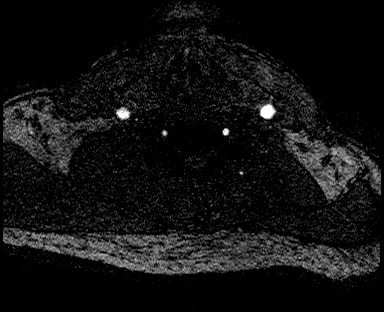
[im 101/226]
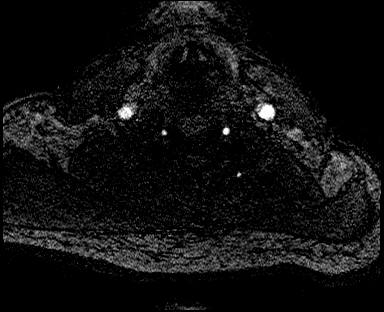
[im 106/226]
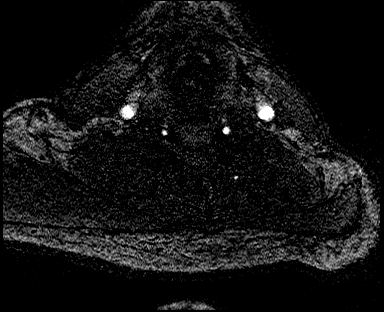
[im 111/226]
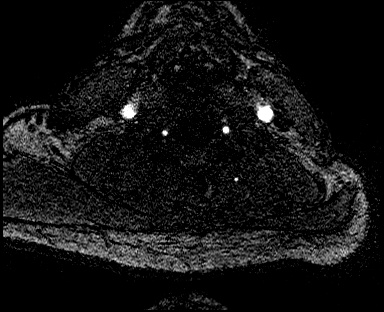
[im 115/226]
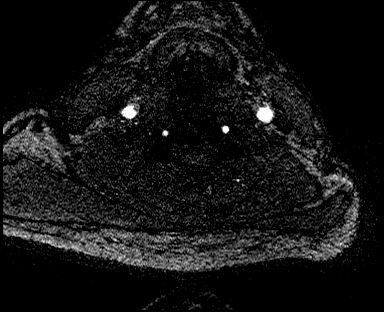
[im 120/226]
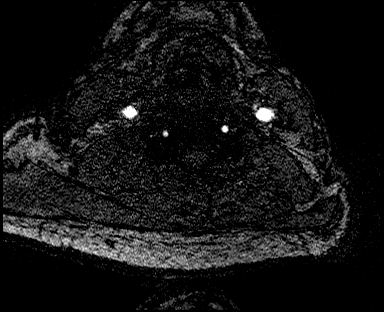
[im 125/226]
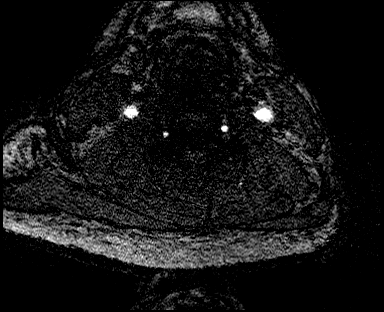
[im 130/226]
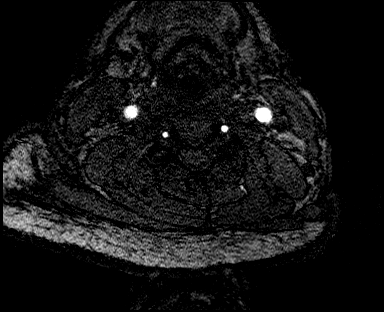
[im 135/226]
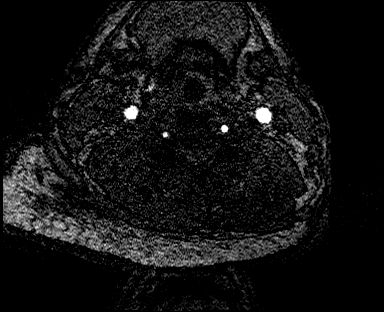
[im 139/226]
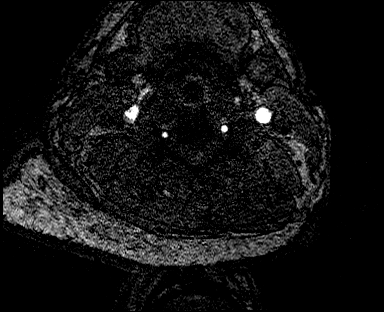
[im 144/226]
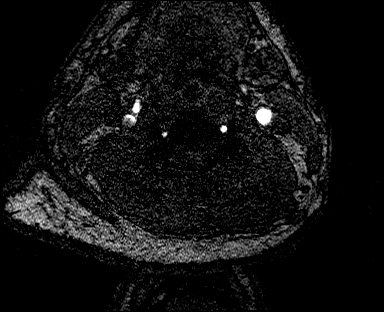
[im 149/226]
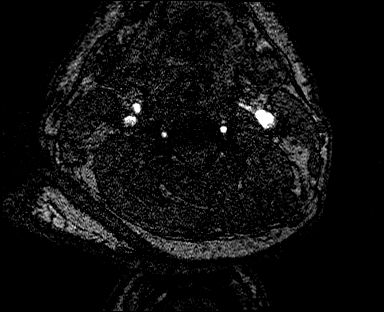
[im 158/226]
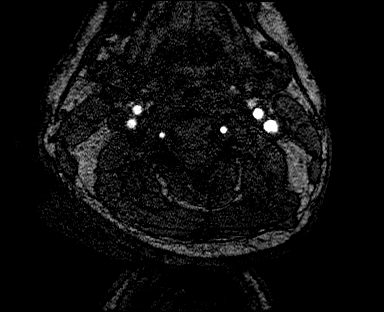
[im 187/226]
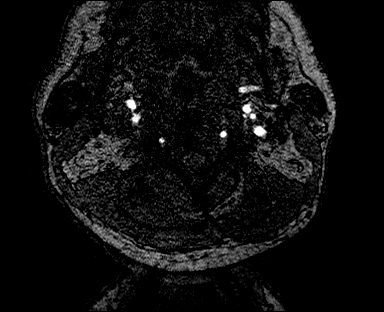
[im 192/226]
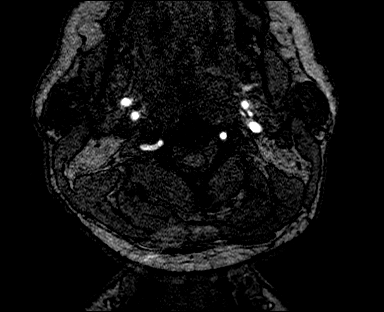
[im 216/226]
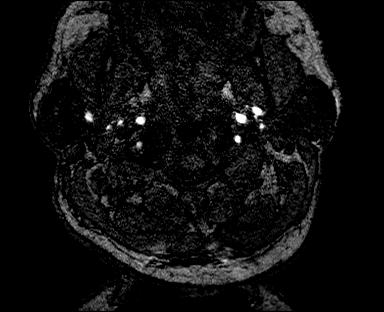

[36 of 48 positions shown; findings below may reference images not displayed]

FINDINGS: MRI HEAD FINDINGS

Brain: A 3.0 x 1.5 cm hemorrhage involving the right lentiform
nucleus and external capsule is similar in size to the recent CT.
There is mild surrounding edema with very mild local mass effect
including slight mass effect on the right lateral ventricle but no
midline shift. A separate 2 mm focus of restricted diffusion
posterolateral to the hemorrhage in the right parietal operculum may
represent a punctate acute infarct. Patchy T2 hyperintensities in
the cerebral white matter bilaterally and in the pons are
nonspecific but compatible with moderate chronic small vessel
ischemic disease. There are chronic lacunar infarcts in the basal
ganglia, left thalamus, and pons. No extra-axial fluid collection or
hydrocephalus is evident. Chronic microhemorrhages are noted in the
pons and right occipital lobe.

Vascular: Major intracranial vascular flow voids are preserved.

Skull and upper cervical spine: Unremarkable bone marrow signal.

Sinuses/Orbits: Unremarkable orbits. Posterior left ethmoid air cell
opacification. No significant mastoid fluid.

Other: None.

MRA HEAD FINDINGS

The included intracranial portions of the vertebral arteries are
widely patent to the basilar with the left being dominant. A patent
left PICA is partially visualized although its origin was not
imaged. A right PICA is not identified. Patent AICA and SCA origins
are seen bilaterally. The basilar artery is widely patent. Posterior
communicating arteries are diminutive or absent. Both PCAs are
patent without evidence of significant proximal stenosis.

The internal carotid arteries are patent from skull base to carotid
termini with a moderate paraclinoid stenosis noted on the right.
ACAs and MCAs are patent with mild branch vessel irregularity but
without evidence of a proximal branch occlusion or significant A1 or
M1 stenosis. The right A1 segment is absent. There is a moderate
stenosis of the right M2 superior division proximally. No aneurysm
is identified.

MRA NECK FINDINGS

Assessment is limited by noncontrast technique and mild motion
artifact. The aortic arch and proximal arch vessels were not imaged.
The included portions of the common carotid and cervical internal
carotid arteries are patent without evidence of a significant
stenosis.

The vertebral arteries are patent with antegrade flow bilaterally
and with the left vertebral artery being mildly to moderately
dominant. Motion artifact limits assessment of the right vertebral
artery origin, and a stenosis is not excluded. There is no evidence
of significant stenosis or dissection elsewhere involving either
vertebral artery in the neck.
IMPRESSION: 1. Unchanged 3 cm right basal ganglia hemorrhage.  Mild edema.
2. Possible nearby punctate acute infarct in the right parietal
operculum.
3. Moderate chronic small vessel ischemic disease with multiple
chronic lacunar infarcts.
4. Intracranial atherosclerosis with moderate right paraclinoid ICA
and right M2 stenoses. No large vessel occlusion.
5. Patent cervical carotid and vertebral arteries without evidence
of a significant stenosis other than at the right vertebral origin
which is not well evaluated due to motion.

## 2021-02-18 IMAGING — MR MR MRA HEAD W/O CM
1 series · 17 of 48 positions shown · non-contrast
Comparison: Head CT [DATE]

CLINICAL DATA: Neuro deficit, acute, stroke suspected. Acute
left-sided weakness. Right basal ganglia hemorrhage on CT.



[Series 9: 3d cow · axial · 0.5mm · 0.41mm/px · z∈[-90,-9]mm · 17 of 172 slices shown]
[im 1/172]
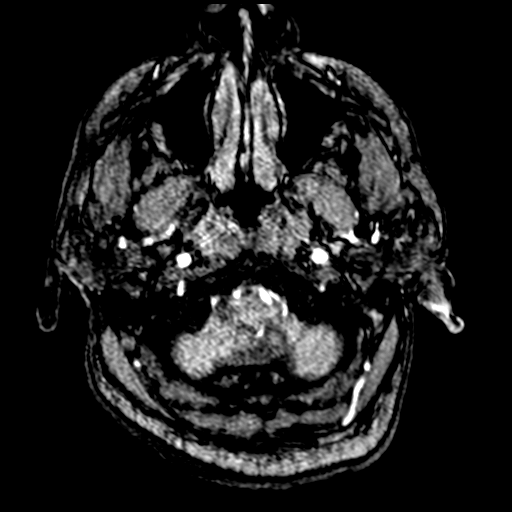
[im 4/172]
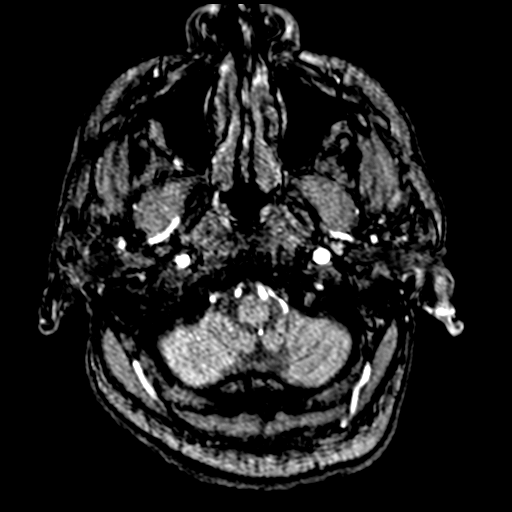
[im 8/172]
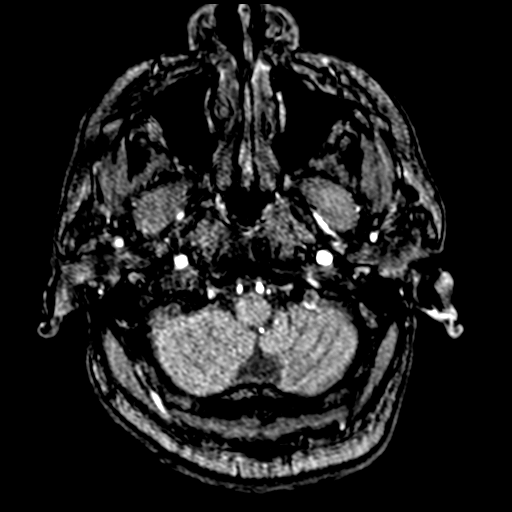
[im 11/172]
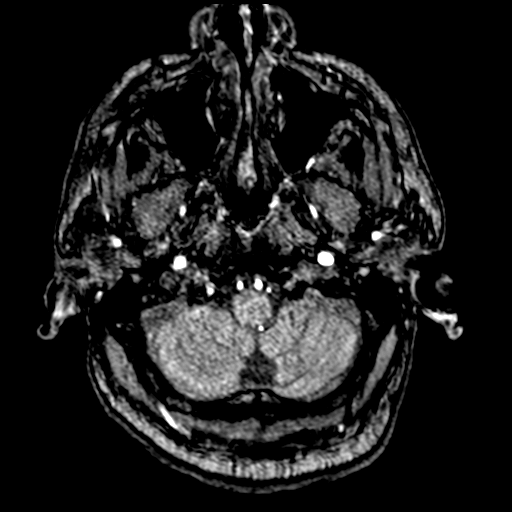
[im 15/172]
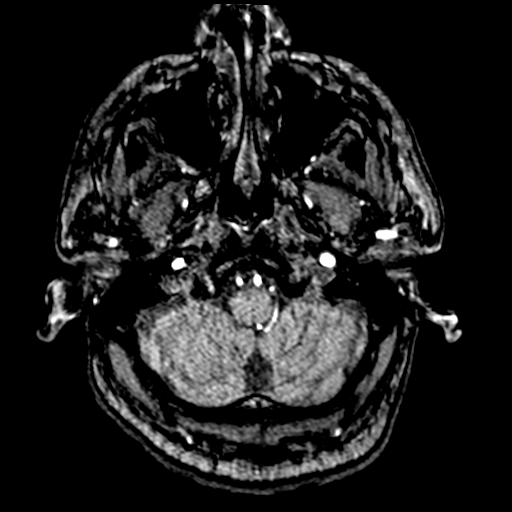
[im 19/172]
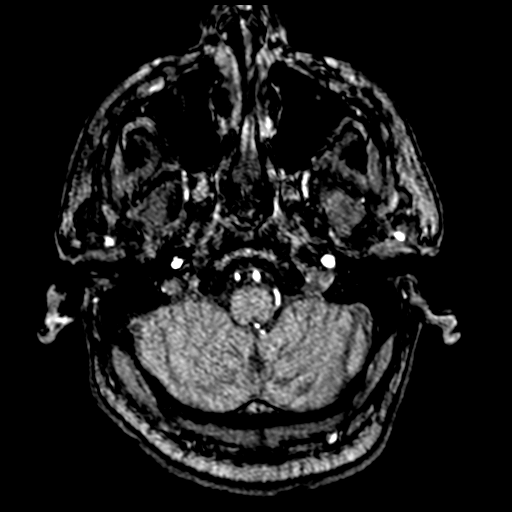
[im 22/172]
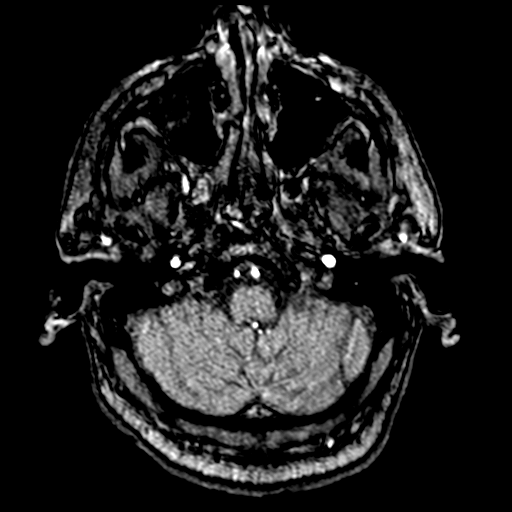
[im 30/172]
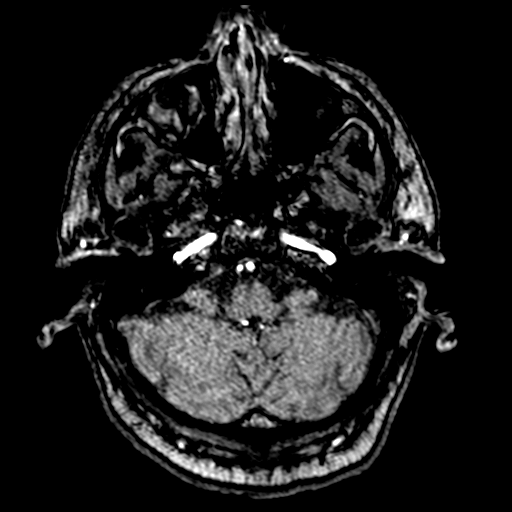
[im 33/172]
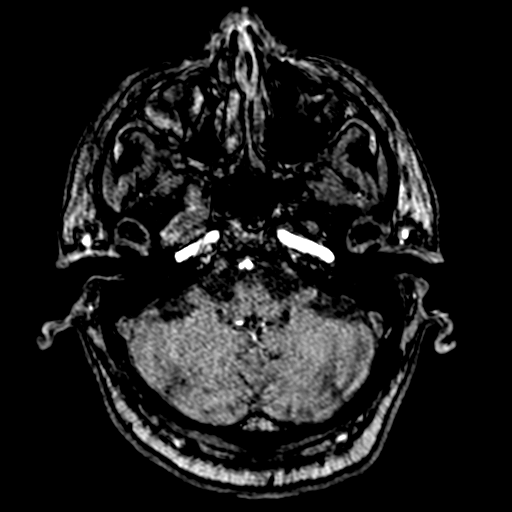
[im 55/172]
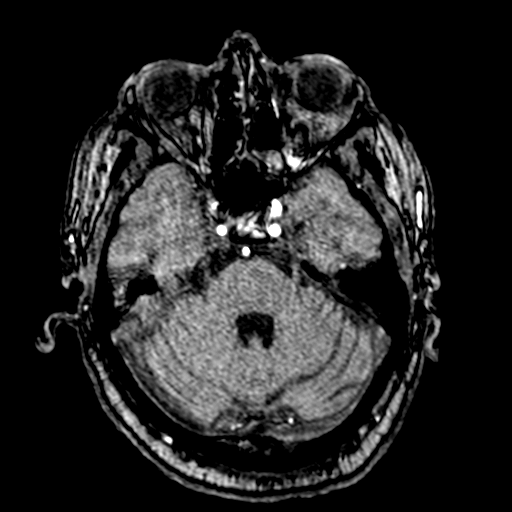
[im 77/172]
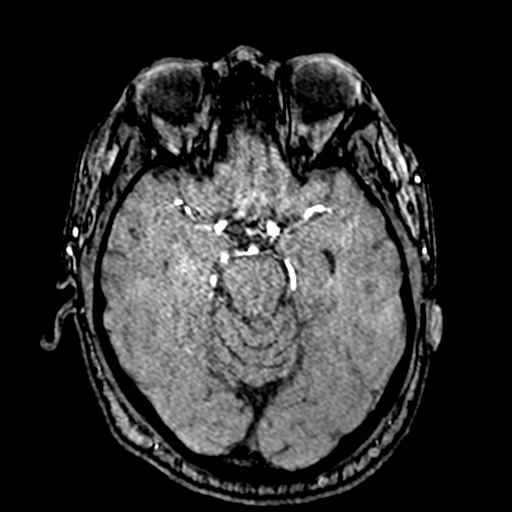
[im 88/172]
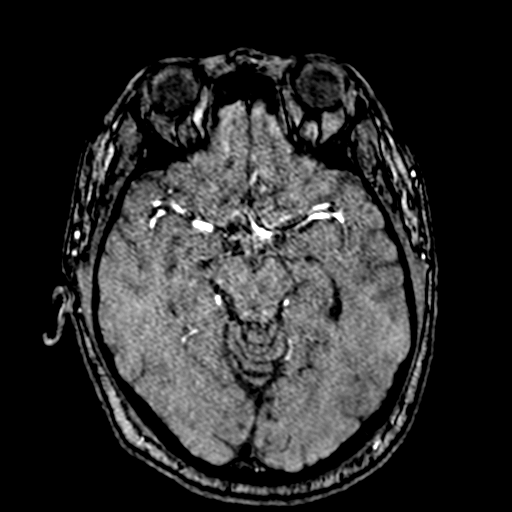
[im 99/172]
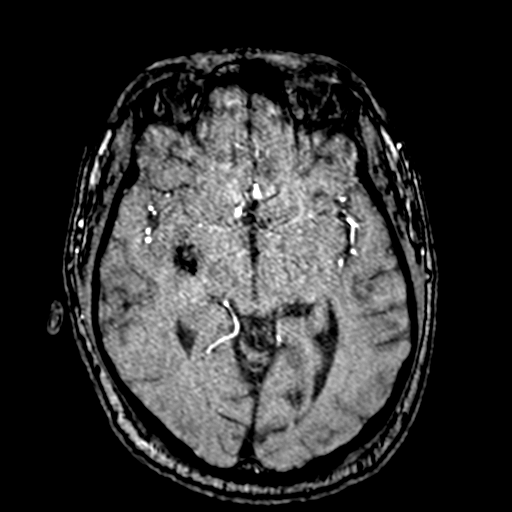
[im 121/172]
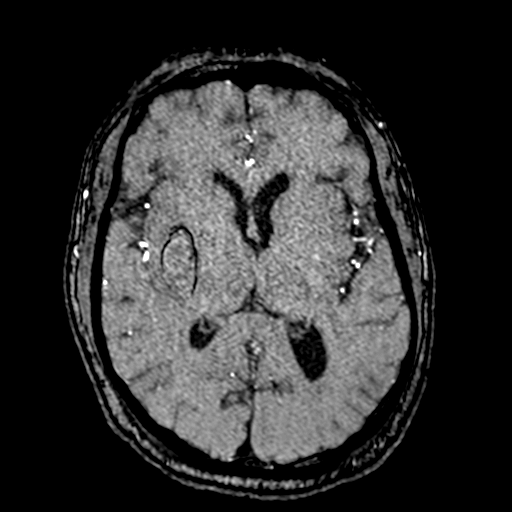
[im 142/172]
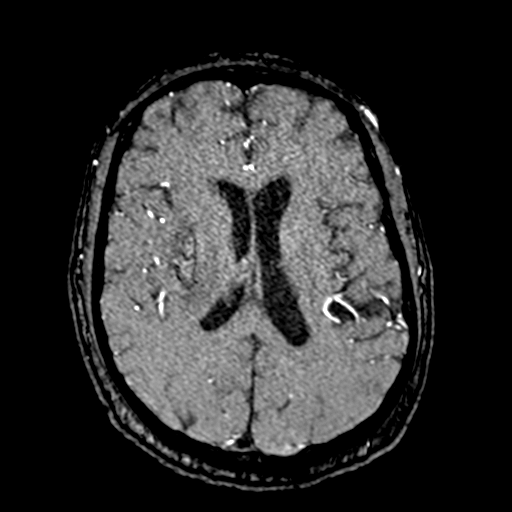
[im 146/172]
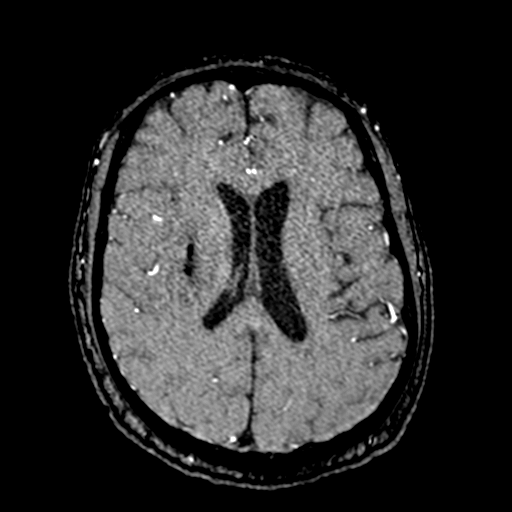
[im 164/172]
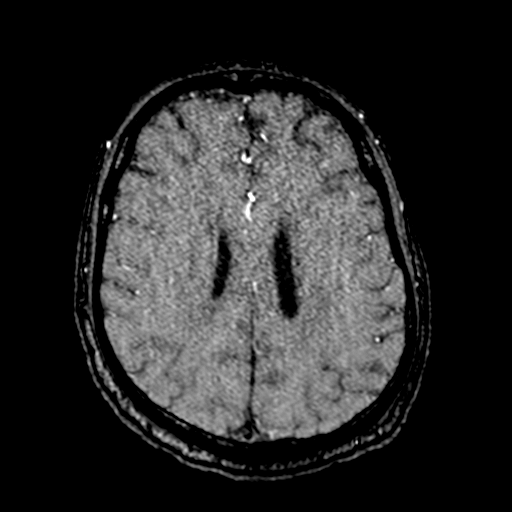

[17 of 48 positions shown; findings below may reference images not displayed]

FINDINGS: MRI HEAD FINDINGS

Brain: A 3.0 x 1.5 cm hemorrhage involving the right lentiform
nucleus and external capsule is similar in size to the recent CT.
There is mild surrounding edema with very mild local mass effect
including slight mass effect on the right lateral ventricle but no
midline shift. A separate 2 mm focus of restricted diffusion
posterolateral to the hemorrhage in the right parietal operculum may
represent a punctate acute infarct. Patchy T2 hyperintensities in
the cerebral white matter bilaterally and in the pons are
nonspecific but compatible with moderate chronic small vessel
ischemic disease. There are chronic lacunar infarcts in the basal
ganglia, left thalamus, and pons. No extra-axial fluid collection or
hydrocephalus is evident. Chronic microhemorrhages are noted in the
pons and right occipital lobe.

Vascular: Major intracranial vascular flow voids are preserved.

Skull and upper cervical spine: Unremarkable bone marrow signal.

Sinuses/Orbits: Unremarkable orbits. Posterior left ethmoid air cell
opacification. No significant mastoid fluid.

Other: None.

MRA HEAD FINDINGS

The included intracranial portions of the vertebral arteries are
widely patent to the basilar with the left being dominant. A patent
left PICA is partially visualized although its origin was not
imaged. A right PICA is not identified. Patent AICA and SCA origins
are seen bilaterally. The basilar artery is widely patent. Posterior
communicating arteries are diminutive or absent. Both PCAs are
patent without evidence of significant proximal stenosis.

The internal carotid arteries are patent from skull base to carotid
termini with a moderate paraclinoid stenosis noted on the right.
ACAs and MCAs are patent with mild branch vessel irregularity but
without evidence of a proximal branch occlusion or significant A1 or
M1 stenosis. The right A1 segment is absent. There is a moderate
stenosis of the right M2 superior division proximally. No aneurysm
is identified.

MRA NECK FINDINGS

Assessment is limited by noncontrast technique and mild motion
artifact. The aortic arch and proximal arch vessels were not imaged.
The included portions of the common carotid and cervical internal
carotid arteries are patent without evidence of a significant
stenosis.

The vertebral arteries are patent with antegrade flow bilaterally
and with the left vertebral artery being mildly to moderately
dominant. Motion artifact limits assessment of the right vertebral
artery origin, and a stenosis is not excluded. There is no evidence
of significant stenosis or dissection elsewhere involving either
vertebral artery in the neck.
IMPRESSION: 1. Unchanged 3 cm right basal ganglia hemorrhage.  Mild edema.
2. Possible nearby punctate acute infarct in the right parietal
operculum.
3. Moderate chronic small vessel ischemic disease with multiple
chronic lacunar infarcts.
4. Intracranial atherosclerosis with moderate right paraclinoid ICA
and right M2 stenoses. No large vessel occlusion.
5. Patent cervical carotid and vertebral arteries without evidence
of a significant stenosis other than at the right vertebral origin
which is not well evaluated due to motion.

## 2021-02-18 MED ORDER — PANTOPRAZOLE SODIUM 40 MG PO TBEC
40.0000 mg | DELAYED_RELEASE_TABLET | Freq: Every day | ORAL | Status: DC
  Administered 2021-02-21: 40 mg via ORAL
  Filled 2021-02-18 (×3): qty 1

## 2021-02-18 MED ORDER — AMLODIPINE BESYLATE 5 MG PO TABS
5.0000 mg | ORAL_TABLET | Freq: Every day | ORAL | Status: DC
  Administered 2021-02-18 – 2021-02-20 (×3): 5 mg via ORAL
  Filled 2021-02-18 (×3): qty 1

## 2021-02-18 MED ORDER — LABETALOL HCL 5 MG/ML IV SOLN
10.0000 mg | INTRAVENOUS | Status: DC | PRN
  Administered 2021-02-18 (×2): 10 mg via INTRAVENOUS
  Filled 2021-02-18 (×2): qty 4

## 2021-02-18 MED ORDER — CHLORHEXIDINE GLUCONATE CLOTH 2 % EX PADS
6.0000 | MEDICATED_PAD | Freq: Every day | CUTANEOUS | Status: DC
  Administered 2021-02-18 – 2021-02-23 (×4): 6 via TOPICAL

## 2021-02-18 NOTE — Progress Notes (Signed)
PT Cancellation Note  Patient Details Name: Carlos Rice MRN: [id number] DOB: 08-May-1970   Cancelled Treatment:    Reason Eval/Treat Not Completed: Patient not medically ready;Active bedrest order per orders, pt on bedrest for 24 hours starting 12/31 at 830 pm. Holding PT eval. Will follow up tomorrow once activity orders have been increased.    Marguarite Arbour A Drenda Sobecki 02/18/2021, 6:58 AM Marisa Severin, PT, DPT Acute Rehabilitation Services Pager [phone] Office [phone]

## 2021-02-18 NOTE — Evaluation (Signed)
Speech Language Pathology Evaluation Patient Details Name: Azariel Banik MRN: [id number] DOB: 1970/11/20 Today's Date: 02/18/2021 Time: 0830-0900 SLP Time Calculation (min) (ACUTE ONLY): 30 min  Problem List:  Patient Active Problem List   Diagnosis Date Noted   ICH (intracerebral hemorrhage) ([place]) 02/17/2021   Past Medical History: No past medical history on file.  HPI:  51 year old male presents as a code stroke.  Was in his car and felt off balance.  Utility worker saw him fall out of the car.  He felt acutely weak on his left side and was having difficulty speaking.  He states its like prior strokes and told EMS he has had 7 prior strokes.  CT head shows intraparenchymal hemorrhage of the basal ganglia.   Assessment / Plan / Recommendation Clinical Impression  Pt demosntrates a mild dysarthria due to decreased lingual tension and slightly poor placement in connected speech. Speech is intelligible but noticably dysarthric to pt who works on the phone all day. Pt able to use Slow, Loud, Overarticulated speech pattern with Pauses (SLOP) to improve articulation with models and then min verbal cues. Expect pt to continue to improve and be able to return to work. He will benefit from f/u in acute care, could benefit from CIR level therapy if warranted from a PT/OT standpoint.    SLP Assessment  SLP Recommendation/Assessment: Patient needs continued Speech [facility] SLP Visit Diagnosis: Dysarthria and anarthria (R47.1)    Recommendations for follow up therapy are one component of a multi-disciplinary discharge planning process, led by the attending physician.  Recommendations may be updated based on patient status, additional functional criteria and insurance authorization.    Follow Up Recommendations  Acute inpatient rehab (3hours/day)    Assistance Recommended at Discharge     Functional Status Assessment Patient has had a recent decline in their functional status  and demonstrates the ability to make significant improvements in function in a reasonable and predictable amount of time.  Frequency and Duration min 2x/week  2 weeks      SLP Evaluation Cognition  Overall Cognitive Status: Within Functional Limits for tasks assessed Orientation Level: Oriented X4       Comprehension  Auditory Comprehension Overall Auditory Comprehension: Appears within functional limits for tasks assessed Reading Comprehension Reading Status: Within funtional limits    Expression Verbal Expression Overall Verbal Expression: Appears within functional limits for tasks assessed Written Expression Dominant Hand: Right (but has learned to use left since prior stroke) Written Expression: Not tested   Oral / Motor  Oral Motor/Sensory Function Overall Oral Motor/Sensory Function: Mild impairment Facial ROM: Within Functional Limits Facial Symmetry: Abnormal symmetry left;Suspected CN VII (facial) dysfunction Facial Strength: Within Functional Limits Facial Sensation: Within Functional Limits Lingual ROM: Within Functional Limits Lingual Symmetry: Within Functional Limits Lingual Strength: Within Functional Limits Lingual Sensation: Within Functional Limits Motor Speech Overall Motor Speech: Impaired Respiration: Within functional limits Phonation: Normal Resonance: Within functional limits Articulation: Impaired Level of Impairment: Word Intelligibility: Intelligible Motor Planning: Witnin functional limits Motor Speech Errors: Aware;Consistent            Jaylie Neaves, Katherene Ponto 02/18/2021, 11:48 AM

## 2021-02-18 NOTE — Evaluation (Signed)
Clinical/Bedside Swallow Evaluation Patient Details  Name: Carlos Rice MRN: [id number] Date of Birth: Feb 06, 1971  Today's Date: 02/18/2021 Time: SLP Start Time (ACUTE ONLY): 0830 SLP Stop Time (ACUTE ONLY): 0900 SLP Time Calculation (min) (ACUTE ONLY): 30 min  Past Medical History: No past medical history on file.  HPI:  51 year old male presents as a code stroke.  Was in his car and felt off balance.  Utility worker saw him fall out of the car.  He felt acutely weak on his left side and was having difficulty speaking.  He states its like prior strokes and told EMS he has had 7 prior strokes.  CT head shows intraparenchymal hemorrhage of the basal ganglia.    Assessment / Plan / Recommendation  Clinical Impression  Despite mild lingual coordination impairment/weakness with speech, pt demonstrates ability to masticate and swallow without difficulty. Pt has a strict diet for himself of whole foods. Pt can choose foods from outside hospital if needed. SLP Visit Diagnosis: Dysphagia, unspecified (R13.10)    Aspiration Risk  Mild aspiration risk    Diet Recommendation Regular;Thin liquid   Liquid Administration via: Cup;Straw Medication Administration: Whole meds with liquid Supervision: Patient able to self feed    Other  Recommendations      Recommendations for follow up therapy are one component of a multi-disciplinary discharge planning process, led by the attending physician.  Recommendations may be updated based on patient status, additional functional criteria and insurance authorization.  Follow up Recommendations No SLP follow up      Assistance Recommended at Discharge    Functional Status Assessment    Frequency and Duration            Prognosis        Swallow Study   General HPI: 51 year old male presents as a code stroke.  Was in his car and felt off balance.  Utility worker saw him fall out of the car.  He felt acutely weak on his left side and was having  difficulty speaking.  He states its like prior strokes and told EMS he has had 7 prior strokes.  CT head shows intraparenchymal hemorrhage of the basal ganglia. Type of Study: Bedside Swallow Evaluation Diet Prior to this Study: NPO Temperature Spikes Noted: No Respiratory Status: Room air History of Recent Intubation: No Behavior/Cognition: Alert;Cooperative;Pleasant mood Oral Cavity Assessment: Within Functional Limits Oral Care Completed by SLP: No Oral Cavity - Dentition: Adequate natural dentition Vision: Functional for self-feeding Self-Feeding Abilities: Needs assist;Needs set up Patient Positioning: Upright in bed Baseline Vocal Quality: Normal Volitional Cough: Strong Volitional Swallow: Able to elicit    Oral/Motor/Sensory Function Overall Oral Motor/Sensory Function: Mild impairment Facial ROM: Within Functional Limits Facial Symmetry: Abnormal symmetry left;Suspected CN VII (facial) dysfunction Facial Strength: Within Functional Limits Facial Sensation: Within Functional Limits Lingual ROM: Within Functional Limits Lingual Symmetry: Within Functional Limits Lingual Strength: Within Functional Limits Lingual Sensation: Within Functional Limits   Ice Chips     Thin Liquid Thin Liquid: Within functional limits    Nectar Thick Nectar Thick Liquid: Not tested   Honey Thick Honey Thick Liquid: Not tested   Puree Puree: Within functional limits   Solid     Solid: Within functional limits      Rodolphe Edmonston, Katherene Ponto 02/18/2021,11:35 AM

## 2021-02-18 NOTE — Progress Notes (Addendum)
STROKE TEAM PROGRESS NOTE   INTERVAL HISTORY SLP at the bedside. Failure nurse swallow screen. Cleviprex infusing and prn labetolol ordered. MRI/MRA ordered for today. Check for nutritional deficiencies given patients diet regimen. Patient states that he does not eat white foods(ie. White sugar, white bread), red meat, fish, or any processed foods. He also eats one meal per day between 7pm-8pm. He states that he has lost approximately 95lbs intentionally doing this. He does also consult with an herbalist for his medical needs and he has not been on any medications for his blood pressure recently.   Vitals:   02/18/21 1630 02/18/21 1645 02/18/21 1700 02/18/21 1715  BP: 130/78 (!) 145/79 (!) 154/139 (!) 136/114  Pulse: 69 70 81 82  Resp: 20 17 19 20   Temp:      TempSrc:      SpO2: 100% 100% 99% 100%  Weight:      Height:       CBC:  Recent Labs  Lab 02/17/21 2012 02/17/21 2020  WBC 6.2  --   NEUTROABS 4.7  --   HGB 14.1 14.3  HCT 42.0 42.0  MCV 93.5  --   PLT 316  --    Basic Metabolic Panel:  Recent Labs  Lab 02/17/21 2012 02/17/21 2020  NA 138 140  K 3.8 3.9  CL 103 104  CO2 27  --   GLUCOSE 130* 121*  BUN 47* 49*  CREATININE 4.58* 4.40*  CALCIUM 9.1  --    Lipid Panel:  Recent Labs  Lab 02/18/21 1500  CHOL 251*  TRIG 82  HDL 104  CHOLHDL 2.4  VLDL 16  LDLCALC 131*   HgbA1c:  Recent Labs  Lab 02/18/21 1500  HGBA1C 6.8*   Urine Drug Screen:  Recent Labs  Lab 02/17/21 2115  LABOPIA NONE DETECTED  COCAINSCRNUR NONE DETECTED  LABBENZ NONE DETECTED  AMPHETMU NONE DETECTED  THCU NONE DETECTED  LABBARB NONE DETECTED    Alcohol Level  Recent Labs  Lab 02/17/21 2012  ETH <10    IMAGING past 24 hours MR ANGIO HEAD WO CONTRAST  Result Date: 02/18/2021 CLINICAL DATA:  Neuro deficit, acute, stroke suspected. Acute left-sided weakness. Right basal ganglia hemorrhage on CT. EXAM: MRI HEAD WITHOUT CONTRAST MRA HEAD WITHOUT CONTRAST MRA NECK WITHOUT  CONTRAST TECHNIQUE: Multiplanar, multiecho pulse sequences of the brain and surrounding structures were obtained without intravenous contrast. Angiographic images of the Circle of Willis were obtained using MRA technique without intravenous contrast. Angiographic images of the neck were obtained using MRA technique without intravenous contrast. Carotid stenosis measurements (when applicable) are obtained utilizing NASCET criteria, using the distal internal carotid diameter as the denominator. COMPARISON:  Head CT 02/17/2021 FINDINGS: MRI HEAD FINDINGS Brain: A 3.0 x 1.5 cm hemorrhage involving the right lentiform nucleus and external capsule is similar in size to the recent CT. There is mild surrounding edema with very mild local mass effect including slight mass effect on the right lateral ventricle but no midline shift. A separate 2 mm focus of restricted diffusion posterolateral to the hemorrhage in the right parietal operculum may represent a punctate acute infarct. Patchy T2 hyperintensities in the cerebral white matter bilaterally and in the pons are nonspecific but compatible with moderate chronic small vessel ischemic disease. There are chronic lacunar infarcts in the basal ganglia, left thalamus, and pons. No extra-axial fluid collection or hydrocephalus is evident. Chronic microhemorrhages are noted in the pons and right occipital lobe. Vascular: Major intracranial vascular flow voids  are preserved. Skull and upper cervical spine: Unremarkable bone marrow signal. Sinuses/Orbits: Unremarkable orbits. Posterior left ethmoid air cell opacification. No significant mastoid fluid. Other: None. MRA HEAD FINDINGS The included intracranial portions of the vertebral arteries are widely patent to the basilar with the left being dominant. A patent left PICA is partially visualized although its origin was not imaged. A right PICA is not identified. Patent AICA and SCA origins are seen bilaterally. The basilar artery is  widely patent. Posterior communicating arteries are diminutive or absent. Both PCAs are patent without evidence of significant proximal stenosis. The internal carotid arteries are patent from skull base to carotid termini with a moderate paraclinoid stenosis noted on the right. ACAs and MCAs are patent with mild branch vessel irregularity but without evidence of a proximal branch occlusion or significant A1 or M1 stenosis. The right A1 segment is absent. There is a moderate stenosis of the right M2 superior division proximally. No aneurysm is identified. MRA NECK FINDINGS Assessment is limited by noncontrast technique and mild motion artifact. The aortic arch and proximal arch vessels were not imaged. The included portions of the common carotid and cervical internal carotid arteries are patent without evidence of a significant stenosis. The vertebral arteries are patent with antegrade flow bilaterally and with the left vertebral artery being mildly to moderately dominant. Motion artifact limits assessment of the right vertebral artery origin, and a stenosis is not excluded. There is no evidence of significant stenosis or dissection elsewhere involving either vertebral artery in the neck. IMPRESSION: 1. Unchanged 3 cm right basal ganglia hemorrhage.  Mild edema. 2. Possible nearby punctate acute infarct in the right parietal operculum. 3. Moderate chronic small vessel ischemic disease with multiple chronic lacunar infarcts. 4. Intracranial atherosclerosis with moderate right paraclinoid ICA and right M2 stenoses. No large vessel occlusion. 5. Patent cervical carotid and vertebral arteries without evidence of a significant stenosis other than at the right vertebral origin which is not well evaluated due to motion. Electronically Signed   By: Logan Bores M.D.   On: 02/18/2021 14:39   MR ANGIO NECK WO CONTRAST  Result Date: 02/18/2021 CLINICAL DATA:  Neuro deficit, acute, stroke suspected. Acute left-sided weakness.  Right basal ganglia hemorrhage on CT. EXAM: MRI HEAD WITHOUT CONTRAST MRA HEAD WITHOUT CONTRAST MRA NECK WITHOUT CONTRAST TECHNIQUE: Multiplanar, multiecho pulse sequences of the brain and surrounding structures were obtained without intravenous contrast. Angiographic images of the Circle of Willis were obtained using MRA technique without intravenous contrast. Angiographic images of the neck were obtained using MRA technique without intravenous contrast. Carotid stenosis measurements (when applicable) are obtained utilizing NASCET criteria, using the distal internal carotid diameter as the denominator. COMPARISON:  Head CT 02/17/2021 FINDINGS: MRI HEAD FINDINGS Brain: A 3.0 x 1.5 cm hemorrhage involving the right lentiform nucleus and external capsule is similar in size to the recent CT. There is mild surrounding edema with very mild local mass effect including slight mass effect on the right lateral ventricle but no midline shift. A separate 2 mm focus of restricted diffusion posterolateral to the hemorrhage in the right parietal operculum may represent a punctate acute infarct. Patchy T2 hyperintensities in the cerebral white matter bilaterally and in the pons are nonspecific but compatible with moderate chronic small vessel ischemic disease. There are chronic lacunar infarcts in the basal ganglia, left thalamus, and pons. No extra-axial fluid collection or hydrocephalus is evident. Chronic microhemorrhages are noted in the pons and right occipital lobe. Vascular: Major intracranial vascular flow  voids are preserved. Skull and upper cervical spine: Unremarkable bone marrow signal. Sinuses/Orbits: Unremarkable orbits. Posterior left ethmoid air cell opacification. No significant mastoid fluid. Other: None. MRA HEAD FINDINGS The included intracranial portions of the vertebral arteries are widely patent to the basilar with the left being dominant. A patent left PICA is partially visualized although its origin was  not imaged. A right PICA is not identified. Patent AICA and SCA origins are seen bilaterally. The basilar artery is widely patent. Posterior communicating arteries are diminutive or absent. Both PCAs are patent without evidence of significant proximal stenosis. The internal carotid arteries are patent from skull base to carotid termini with a moderate paraclinoid stenosis noted on the right. ACAs and MCAs are patent with mild branch vessel irregularity but without evidence of a proximal branch occlusion or significant A1 or M1 stenosis. The right A1 segment is absent. There is a moderate stenosis of the right M2 superior division proximally. No aneurysm is identified. MRA NECK FINDINGS Assessment is limited by noncontrast technique and mild motion artifact. The aortic arch and proximal arch vessels were not imaged. The included portions of the common carotid and cervical internal carotid arteries are patent without evidence of a significant stenosis. The vertebral arteries are patent with antegrade flow bilaterally and with the left vertebral artery being mildly to moderately dominant. Motion artifact limits assessment of the right vertebral artery origin, and a stenosis is not excluded. There is no evidence of significant stenosis or dissection elsewhere involving either vertebral artery in the neck. IMPRESSION: 1. Unchanged 3 cm right basal ganglia hemorrhage.  Mild edema. 2. Possible nearby punctate acute infarct in the right parietal operculum. 3. Moderate chronic small vessel ischemic disease with multiple chronic lacunar infarcts. 4. Intracranial atherosclerosis with moderate right paraclinoid ICA and right M2 stenoses. No large vessel occlusion. 5. Patent cervical carotid and vertebral arteries without evidence of a significant stenosis other than at the right vertebral origin which is not well evaluated due to motion. Electronically Signed   By: Logan Bores M.D.   On: 02/18/2021 14:39   MR BRAIN WO  CONTRAST  Result Date: 02/18/2021 CLINICAL DATA:  Neuro deficit, acute, stroke suspected. Acute left-sided weakness. Right basal ganglia hemorrhage on CT. EXAM: MRI HEAD WITHOUT CONTRAST MRA HEAD WITHOUT CONTRAST MRA NECK WITHOUT CONTRAST TECHNIQUE: Multiplanar, multiecho pulse sequences of the brain and surrounding structures were obtained without intravenous contrast. Angiographic images of the Circle of Willis were obtained using MRA technique without intravenous contrast. Angiographic images of the neck were obtained using MRA technique without intravenous contrast. Carotid stenosis measurements (when applicable) are obtained utilizing NASCET criteria, using the distal internal carotid diameter as the denominator. COMPARISON:  Head CT 02/17/2021 FINDINGS: MRI HEAD FINDINGS Brain: A 3.0 x 1.5 cm hemorrhage involving the right lentiform nucleus and external capsule is similar in size to the recent CT. There is mild surrounding edema with very mild local mass effect including slight mass effect on the right lateral ventricle but no midline shift. A separate 2 mm focus of restricted diffusion posterolateral to the hemorrhage in the right parietal operculum may represent a punctate acute infarct. Patchy T2 hyperintensities in the cerebral white matter bilaterally and in the pons are nonspecific but compatible with moderate chronic small vessel ischemic disease. There are chronic lacunar infarcts in the basal ganglia, left thalamus, and pons. No extra-axial fluid collection or hydrocephalus is evident. Chronic microhemorrhages are noted in the pons and right occipital lobe. Vascular: Major intracranial vascular flow  voids are preserved. Skull and upper cervical spine: Unremarkable bone marrow signal. Sinuses/Orbits: Unremarkable orbits. Posterior left ethmoid air cell opacification. No significant mastoid fluid. Other: None. MRA HEAD FINDINGS The included intracranial portions of the vertebral arteries are widely  patent to the basilar with the left being dominant. A patent left PICA is partially visualized although its origin was not imaged. A right PICA is not identified. Patent AICA and SCA origins are seen bilaterally. The basilar artery is widely patent. Posterior communicating arteries are diminutive or absent. Both PCAs are patent without evidence of significant proximal stenosis. The internal carotid arteries are patent from skull base to carotid termini with a moderate paraclinoid stenosis noted on the right. ACAs and MCAs are patent with mild branch vessel irregularity but without evidence of a proximal branch occlusion or significant A1 or M1 stenosis. The right A1 segment is absent. There is a moderate stenosis of the right M2 superior division proximally. No aneurysm is identified. MRA NECK FINDINGS Assessment is limited by noncontrast technique and mild motion artifact. The aortic arch and proximal arch vessels were not imaged. The included portions of the common carotid and cervical internal carotid arteries are patent without evidence of a significant stenosis. The vertebral arteries are patent with antegrade flow bilaterally and with the left vertebral artery being mildly to moderately dominant. Motion artifact limits assessment of the right vertebral artery origin, and a stenosis is not excluded. There is no evidence of significant stenosis or dissection elsewhere involving either vertebral artery in the neck. IMPRESSION: 1. Unchanged 3 cm right basal ganglia hemorrhage.  Mild edema. 2. Possible nearby punctate acute infarct in the right parietal operculum. 3. Moderate chronic small vessel ischemic disease with multiple chronic lacunar infarcts. 4. Intracranial atherosclerosis with moderate right paraclinoid ICA and right M2 stenoses. No large vessel occlusion. 5. Patent cervical carotid and vertebral arteries without evidence of a significant stenosis other than at the right vertebral origin which is not  well evaluated due to motion. Electronically Signed   By: Logan Bores M.D.   On: 02/18/2021 14:39   CT HEAD CODE STROKE WO CONTRAST  Result Date: 02/17/2021 CLINICAL DATA:  Code stroke. Slurred speech and left-sided weakness. EXAM: CT HEAD WITHOUT CONTRAST TECHNIQUE: Contiguous axial images were obtained from the base of the skull through the vertex without intravenous contrast. COMPARISON:  None. FINDINGS: The study is mildly motion degraded. Brain: An acute hemorrhage involving the right lentiform nucleus and external capsule measures 3.0 x 1.3 x 2.5 cm (estimated volume of 4.9 mL). There is minimal surrounding edema. No midline shift, acute cortically based infarct, or extra-axial fluid collection is identified. Patchy hypodensities in the cerebral white matter bilaterally are nonspecific but compatible with moderate chronic small vessel ischemic disease. The ventricles are normal in size. Vascular: Calcified atherosclerosis at the skull base. No hyperdense vessel. Skull: No fracture or suspicious osseous lesion. Sinuses/Orbits: Posterior left ethmoid air cell opacification. Clear mastoid air cells. Unremarkable orbits. Other: None. ASPECTS [facility] Stroke Program Early CT Score) Not scored due to the presence of acute hemorrhage. IMPRESSION: 1. Acute right basal ganglia hemorrhage. 2. Moderate chronic small vessel ischemic disease. These results were communicated to Dr. Cheral Marker at 8:00 pm on 02/17/2021 by text page via the [facility] messaging system. Electronically Signed   By: Logan Bores M.D.   On: 02/17/2021 20:01    PHYSICAL EXAM  Temp:  [97.8 F (36.6 C)-98.7 F (37.1 C)] 98.5 F (36.9 C) (01/01 0800) Pulse Rate:  [69-95]  82 (01/01 1715) Resp:  [11-30] 20 (01/01 1715) BP: (117-221)/(55-139) 136/114 (01/01 1715) SpO2:  [78 %-100 %] 100 % (01/01 1715) Weight:  [85.4 kg] 85.4 kg (12/31 2018)  General - Well nourished, well developed, in no apparent distress. Ophthalmologic - fundi not  visualized due to noncooperation. Cardiovascular - Regular rhythm and rate.  Mental Status -  Level of arousal and orientation to time, place, and person were intact. Language including expression, naming, repetition, comprehension was assessed and found intact. Recent and remote memory were intact. Fund of Knowledge was assessed and was intact.  Cranial Nerves II - XII - II - Visual field intact OU. III, IV, VI - Right eye is slightly esotropic, patient states this is baseline from a previous stroke V - Facial sensation intact bilaterally. VII - Left facial droop VIII - Hearing & vestibular intact bilaterally. X - Palate elevates symmetrically. XI - Chin turning & shoulder shrug intact bilaterally. XII - Tongue protrusion intact.  Motor Strength - The patients strength was normal in all extremities except for left UE drift.  Bulk was normal and fasciculations were absentMotor Tone - Muscle tone was assessed at the neck and appendages and was normal. Reflexes - The patients reflexes were symmetrical in all extremities and he had no pathological reflexes. Sensory - Light touch, temperature/pinprick were assessed and were symmetrical Coordination - Prominent dysmetria with left FNF, slight LLE ataxia with HTS Gait and Station - deferred.   ASSESSMENT/PLAN Carlos Rice is an 51 y.o. male with a PMH of uncontrolled HTN (noncompliant with medications), right toe amputation, prior stroke with right sided weakness in 2016 who presents to the hospital via EMS as a Code Stroke after he was noted to acutely be weak on his left side following a fall out of his car. The patient states that while getting out of his car, he suddenly felt unsteady and then was witnessed to fall to the ground. He states that when he tried to get back up, he was too weak on his left side to do so. EMS was called and he was brought to the ED. He was severely hypertensive en route, with a BP of 250/146. CBG was 147. The  patient states that when he was diagnosed with his first clinically apparent stroke in 2016, he was told that his MRI showed "7 strokes" including the new one that he presented with at that time.   Stroke: acute hemorrhage involving the right lentiform nucleus and external capsule likely secondary to uncontrolled hypertension Code Stroke -CT head reveals an acute hemorrhage involving the right lentiform nucleus and external capsule measuring 3.0 x 1.3 x 2.5 cm (estimated volume of 4.9 mL). There is minimal surrounding edema. Patchy hypodensities in the cerebral white matter bilaterally are nonspecific but compatible with moderate chronic small vessel ischemic disease MRI/ MRA- Unchanged 3 cm right basal ganglia hemorrhage.  Mild edema. Possible nearby punctate acute infarct in the right parietal operculum. Had a modera high dyspnea noted te chronic small vessel ischemic disease with multiple chronic lacunar infarcts. Intracranial atherosclerosis with moderate right paraclinoid ICA and right M2 stenoses. No large vessel occlusion. Patent cervical carotid and vertebral arteries without evidence of a significant stenosis other than at the right vertebral origin which is not well evaluated due to motion. 2D Echo pending LDL pending HgbA1c pending VTE prophylaxis -SCDs    Diet   Diet regular Room service appropriate? Yes; Fluid consistency: Thin   No antithrombotic prior to admission, now on No  antithrombotic. ICH Therapy recommendations:  pending Disposition:  pending  Hypertension Home meds:  None Seen in May 2021 for uncontrolled HTN, was recommended to take Norvasc 10mg  Unstable BP goal systolic [id number] Cleviprex IV PRN labetolol   Hyperlipidemia Home meds:  None, resumed in hospital LDL, goal < 70  Diabetes type II Controlled Home meds:  none HgbA1c Pending ., goal < 7.0 CBGs Recent Labs    02/17/21 [place] 126*    SSI  Other Stroke Risk Factors Hx stroke/TIA Patient  states that he has had 7 strokes in the past, these were seen on MRI   Hospital day # 1  Patient seen and examined by NP/APP with MD. MD to update note as needed.   Janine Ores, DNP, FNP-BC [facility] Pager: [phone]  ATTENDING ATTESTATION:  Pt with right BG ICH, repeat CT later today to ensure stability. Monitor BP.  Dr. Reeves Forth evaluated pt independently, reviewed imaging, chart, labs. Discussed and formulated plan with the APP. Please see APP note above for details.     This patient is critically ill due to respiratory distress, ICH and at significant risk of neurological worsening, death form heart failure, respiratory failure, recurrent stroke, bleeding from [facility], seizure, sepsis. This patient's care requires constant monitoring of vital signs, hemodynamics, respiratory and cardiac monitoring, review of multiple databases, neurological assessment, discussion with family, other specialists and medical decision making of high complexity. I spent 35 minutes of neurocritical care time in the care of this patient.   Joie Hipps,MD   To contact Stroke Continuity provider, please refer to [email or web address] After hours, contact General Neurology

## 2021-02-19 ENCOUNTER — Other Ambulatory Visit (HOSPITAL_COMMUNITY): Payer: Self-pay

## 2021-02-19 ENCOUNTER — Inpatient Hospital Stay (HOSPITAL_COMMUNITY): Payer: Medicare HMO

## 2021-02-19 LAB — CBC
HCT: 36.6 % — ABNORMAL LOW (ref 39.0–52.0)
Hemoglobin: 12.7 g/dL — ABNORMAL LOW (ref 13.0–17.0)
MCH: 31.8 pg (ref 26.0–34.0)
MCHC: 34.7 g/dL (ref 30.0–36.0)
MCV: 91.7 fL (ref 80.0–100.0)
Platelets: 264 10*3/uL (ref 150–400)
RBC: 3.99 MIL/uL — ABNORMAL LOW (ref 4.22–5.81)
RDW: 11.9 % (ref 11.5–15.5)
WBC: 5.4 10*3/uL (ref 4.0–10.5)
nRBC: 0 % (ref 0.0–0.2)

## 2021-02-19 LAB — TRIGLYCERIDES: Triglycerides: 154 mg/dL — ABNORMAL HIGH (ref <150)

## 2021-02-19 LAB — BASIC METABOLIC PANEL
Anion gap: 8 (ref 5–15)
BUN: 45 mg/dL — ABNORMAL HIGH (ref 6–20)
CO2: 25 mmol/L (ref 22–32)
Calcium: 8.9 mg/dL (ref 8.9–10.3)
Chloride: 105 mmol/L (ref 98–111)
Creatinine, Ser: 5.41 mg/dL — ABNORMAL HIGH (ref 0.61–1.24)
GFR, Estimated: 12 mL/min — ABNORMAL LOW (ref >60)
Glucose, Bld: 114 mg/dL — ABNORMAL HIGH (ref 70–99)
Potassium: 4.1 mmol/L (ref 3.5–5.1)
Sodium: 138 mmol/L (ref 135–145)

## 2021-02-19 MED ORDER — HYDRALAZINE HCL 20 MG/ML IJ SOLN
10.0000 mg | Freq: Four times a day (QID) | INTRAMUSCULAR | Status: DC | PRN
  Administered 2021-02-19: 10 mg via INTRAVENOUS
  Filled 2021-02-19: qty 1

## 2021-02-19 MED ORDER — LABETALOL HCL 5 MG/ML IV SOLN
10.0000 mg | INTRAVENOUS | Status: DC | PRN
  Administered 2021-02-19 – 2021-02-20 (×3): 10 mg via INTRAVENOUS
  Filled 2021-02-19 (×2): qty 4

## 2021-02-19 NOTE — Progress Notes (Signed)
°  Transition of Care [facility]) Screening Note   Patient Details  Name: Carlos Rice Date of Birth: Apr 01, 1970   Transition of Care [facility] CM/SW Contact:    Ella Bodo, RN Phone Number: 02/19/2021, 3:06 PM    Transition of Care Department [facility]) has reviewed patient and no TOC needs have been identified at this time. We will continue to monitor patient advancement through interdisciplinary progression rounds. If new patient transition needs arise, please place a TOC consult.    Reinaldo Raddle, RN, BSN  Trauma/Neuro ICU Case Manager [phone]

## 2021-02-19 NOTE — Evaluation (Signed)
Physical Therapy Evaluation Patient Details Name: Carlos Rice MRN: [id number] DOB: [birth year] Today's Date: 02/19/2021  History of Present Illness  51 yo male admited code stroke with L side weakness and difficulty speaking. CT intraparenchymal hemorrhage of basal ganglia. 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 Pt very set in ways with decreased insight to deficits and safety. Pt adamant about returning home ASAP because "I can't get sicker." Pt inconsistent with PLOF report and amount of assist he has available/willing to allow. Pt with noted L sided weakness, balance impairement/incresaed falls risk, noted coordination deficits and ataxia with ambulation requiring assist for all ADLs and mobility. Recommending CIR upon d/c for maximal functional recovery to transition home alone.     Recommendations for follow up therapy are one component of a multi-disciplinary discharge planning process, led by the attending physician.  Recommendations may be updated based on patient status, additional functional criteria and insurance authorization.  Follow Up Recommendations Acute inpatient rehab (3hours/day) (pt states "I can't stay in the hospital. I've worked in the hospital, I know what happens. People get sicker. I already have a cough from being here.")    Assistance Recommended at Discharge Frequent or constant Supervision/Assistance  Functional Status Assessment Patient has had a recent decline in their functional status and demonstrates the ability to make significant improvements in function in a reasonable and predictable amount of time.  Equipment Recommendations  Rolling walker (2 wheels)    Recommendations for Other Services Rehab consult (Psych Consult)     Precautions / Restrictions Precautions Precautions: Fall Restrictions Weight Bearing Restrictions: No      Mobility  Bed  Mobility Overal bed mobility: Needs Assistance Bed Mobility: Supine to Sit;Rolling Rolling: Supervision   Supine to sit: Min assist     General bed mobility comments: pt exiting the bed on the L side. pt needs increased time and again verbal cues to allow staff to help him due to lines/ leads.    Transfers Overall transfer level: Needs assistance Equipment used: Rolling walker (2 wheels) Transfers: Sit to/from Stand Sit to Stand: Min assist           General transfer comment: pt pulling on rw despite cues to push from bed surface. pt expressed he doesnt need a rw. pt states "me and a walker dont get along" yet pt reaching outside base of support for the walker. pt needs cues for safety inside the rw, wide base of support, shaky, pt completed several sit to stand because "I just have to get on my feet"    Ambulation/Gait Ambulation/Gait assistance: Mod assist;+2 safety/equipment Gait Distance (Feet): 5 Feet (x1, 20x1) Assistive device: Rolling walker (2 wheels) Gait Pattern/deviations: Step-through pattern;Decreased stride length;Wide base of support Gait velocity: dec Gait velocity interpretation: <1.8 ft/sec, indicate of risk for recurrent falls   General Gait Details: pt with wide base of support pushing RW to far out in front requiring PT assist with walker management, max directional verbal cues to stand up straight and stay in walker, pt very shaky in both bilat UEs and LEs with ataxia/impaired coordination  Stairs            Wheelchair Mobility    Modified Rankin (Stroke Patients Only) Modified Rankin (Stroke Patients Only) Pre-Morbid Rankin Score: Slight disability Modified Rankin: Moderately severe disability     Balance Overall balance assessment: Needs assistance Sitting-balance support: Bilateral upper extremity supported;Feet  supported Sitting balance-Leahy Scale: Fair Sitting balance - Comments: observed working with OT with donning of shorts, pt  very unsteady erquiring OT's assist   Standing balance support: Bilateral upper extremity supported;Reliant on assistive device for balance Standing balance-Leahy Scale: Poor Standing balance comment: requires external assist                             Pertinent Vitals/Pain Pain Assessment: No/denies pain    Home Living Family/patient expects to be discharged to:: Private residence Living Arrangements: Children (son 73 yo Carlos Rice) Available Help at Discharge: Family;Available PRN/intermittently Type of Home: Apartment Home Access: Level entry       Home Layout: One level Home Equipment: Grab bars - tub/shower      Prior Function Prior Level of Function : Independent/Modified Independent             Mobility Comments: no AD, drives, does grocery shopping ADLs Comments: indep     Hand Dominance   Dominant Hand: Right    Extremity/Trunk Assessment   Upper Extremity Assessment Upper Extremity Assessment: Defer to OT evaluation    Lower Extremity Assessment Lower Extremity Assessment: Generalized weakness (gross coordination deficits)    Cervical / Trunk Assessment Cervical / Trunk Assessment: Normal  Communication   Communication: Other (comment) (pt noted to have some L side facial weakness but able to understand what pt is expressing)  Cognition Arousal/Alertness: Awake/alert Behavior During Therapy: Impulsive;Restless Overall Cognitive Status: Impaired/Different from baseline Area of Impairment: Orientation                 Orientation Level: Time (reports 1/3. reports he has been here for 3 days.)             General Comments: pt reports living 200 ft from his mother but not showering in 9 days. when asked why he wouldnt shower at mothers house and pt reports I get up before they do.        General Comments General comments (skin integrity, edema, etc.): RN increased on BP meds allowing therapy to work with PT without BP going  above parameters    Exercises     Assessment/Plan    PT Assessment Patient needs continued PT services  PT Problem List Decreased strength;Decreased range of motion;Decreased activity tolerance;Decreased balance;Decreased mobility;Decreased coordination;Decreased safety awareness       PT Treatment Interventions DME instruction;Gait training;Stair training;Functional mobility training;Therapeutic activities;Therapeutic exercise;Balance training    PT Goals (Current goals can be found in the Care Plan section)  Acute Rehab PT Goals Patient Stated Goal: home PT Goal Formulation: With patient Time For Goal Achievement: 03/05/21 Potential to Achieve Goals: Good    Frequency Min 4X/week   Barriers to discharge Decreased caregiver support unsure of true availabe assist or the willingness of pt to allow for assist    Co-evaluation PT/OT/SLP Co-Evaluation/Treatment: Yes Reason for Co-Treatment: To address functional/ADL transfers PT goals addressed during session: Mobility/safety with mobility         AM-PAC PT "6 Clicks" Mobility  Outcome Measure Help needed turning from your back to your side while in a flat bed without using bedrails?: A Little Help needed moving from lying on your back to sitting on the side of a flat bed without using bedrails?: A Little Help needed moving to and from a bed to a chair (including a wheelchair)?: A Lot Help needed standing up from a chair using your arms (e.g., wheelchair or  bedside chair)?: A Lot Help needed to walk in hospital room?: A Lot Help needed climbing 3-5 steps with a railing? : Total 6 Click Score: 13    End of Session Equipment Utilized During Treatment: Gait belt Activity Tolerance: Patient tolerated treatment well Patient left: in chair;with call bell/phone within reach;with chair alarm set Nurse Communication: Mobility status PT Visit Diagnosis: Unsteadiness on feet (R26.81);Muscle weakness (generalized)  (M62.81);Difficulty in walking, not elsewhere classified (R26.2)    Time: 4360-6770 PT Time Calculation (min) (ACUTE ONLY): 37 min   Charges:   PT Evaluation $PT Eval Moderate Complexity: 1 Mod PT Treatments $Gait Training: 8-22 mins        Kittie Plater, PT, DPT Acute Rehabilitation Services Pager #: [phone] Office #: [phone]   Berline Lopes 02/19/2021, 2:59 PM

## 2021-02-19 NOTE — Progress Notes (Signed)
STROKE TEAM PROGRESS NOTE   INTERVAL HISTORY His RN is at the bedside.  He is still on Cleviprex drip for blood pressure control.  Patient is refusing to be on long-term blood pressure medications as he would rather listen to his herbalist who has promised him cured from medications which allopathic medicines cannot.  He is on a strict fruit and vegetable diet which has helped him avoid having stroke since his last stroke in 2013.  Vitals:   02/19/21 1115 02/19/21 1200 02/19/21 1300 02/19/21 1330  BP: (!) 157/92 (!) 177/96 (!) 159/97 121/88  Pulse: 73 79    Resp: 15 18 16 17   Temp:  98.4 F (36.9 C)    TempSrc:  Oral    SpO2: 96% 98%    Weight:      Height:       CBC:  Recent Labs  Lab 02/17/21 2012 02/17/21 2020 02/19/21 0600  WBC 6.2  --  5.4  NEUTROABS 4.7  --   --   HGB 14.1 14.3 12.7*  HCT 42.0 42.0 36.6*  MCV 93.5  --  91.7  PLT 316  --  017   Basic Metabolic Panel:  Recent Labs  Lab 02/17/21 2012 02/17/21 2020 02/19/21 0600  NA 138 140 138  K 3.8 3.9 4.1  CL 103 104 105  CO2 27  --  25  GLUCOSE 130* 121* 114*  BUN 47* 49* 45*  CREATININE 4.58* 4.40* 5.41*  CALCIUM 9.1  --  8.9   Lipid Panel:  Recent Labs  Lab 02/18/21 1500 02/19/21 0600  CHOL 251*  --   TRIG 82 154*  HDL 104  --   CHOLHDL 2.4  --   VLDL 16  --   LDLCALC 131*  --    HgbA1c:  Recent Labs  Lab 02/18/21 1500  HGBA1C 6.8*   Urine Drug Screen:  Recent Labs  Lab 02/17/21 2115  LABOPIA NONE DETECTED  COCAINSCRNUR NONE DETECTED  LABBENZ NONE DETECTED  AMPHETMU NONE DETECTED  THCU NONE DETECTED  LABBARB NONE DETECTED    Alcohol Level  Recent Labs  Lab 02/17/21 2012  ETH <10    IMAGING past 24 hours No results found.  PHYSICAL EXAM  Temp:  [97.4 F (36.3 C)-98.6 F (37 C)] 98.4 F (36.9 C) (01/02 1200) Pulse Rate:  [65-85] 79 (01/02 1200) Resp:  [10-28] 17 (01/02 1330) BP: (110-177)/(68-139) 121/88 (01/02 1330) SpO2:  [93 %-100 %] 98 % (01/02 1200)  General -  Well nourished, well developed, in no apparent distress. Ophthalmologic - fundi not visualized due to noncooperation. Cardiovascular - Regular rhythm and rate.  Mental Status -  Level of arousal and orientation to time, place, and person were intact. Language including expression, naming, repetition, comprehension was assessed and found intact. Recent and remote memory were intact. Fund of Knowledge was assessed and was intact.  Cranial Nerves II - XII - II - Visual field intact OU. III, IV, VI - Right eye is slightly esotropic, patient states this is baseline from a previous stroke V - Facial sensation intact bilaterally. VII -mild left facial droop VIII - Hearing & vestibular intact bilaterally. X - Palate elevates symmetrically. XI - Chin turning & shoulder shrug intact bilaterally. XII - Tongue protrusion intact.  Motor Strength - The patients strength was normal in all extremities except for left UE drift.  Bulk was normal and fasciculations were absentMotor Tone - Muscle tone was assessed at the neck and appendages and was normal.  Diminished fine finger movements on the left and orbits right over left upper extremity. Reflexes - The patients reflexes were symmetrical in all extremities and he had no pathological reflexes. Sensory - Light touch, temperature/pinprick were assessed and were symmetrical Coordination - Prominent dysmetria with left FNF, slight LLE ataxia with HTS Gait and Station - deferred.   ASSESSMENT/PLAN Carlos Rice is an 51 y.o. male with a PMH of uncontrolled HTN (noncompliant with medications), right toe amputation, prior stroke with right sided weakness in 2016 who presents to the hospital via EMS as a Code Stroke after he was noted to acutely be weak on his left side following a fall out of his car. The patient states that while getting out of his car, he suddenly felt unsteady and then was witnessed to fall to the ground. He states that when he tried to  get back up, he was too weak on his left side to do so. EMS was called and he was brought to the ED. He was severely hypertensive en route, with a BP of 250/146. CBG was 147. The patient states that when he was diagnosed with his first clinically apparent stroke in 2016, he was told that his MRI showed "7 strokes" including the new one that he presented with at that time.   Stroke: acute hemorrhage involving the right lentiform nucleus and external capsule likely secondary to uncontrolled hypertension Code Stroke -CT head reveals an acute hemorrhage involving the right lentiform nucleus and external capsule measuring 3.0 x 1.3 x 2.5 cm (estimated volume of 4.9 mL). There is minimal surrounding edema. Patchy hypodensities in the cerebral white matter bilaterally are nonspecific but compatible with moderate chronic small vessel ischemic disease MRI/ MRA- Unchanged 3 cm right basal ganglia hemorrhage.  Mild edema. Possible nearby punctate acute infarct in the right parietal operculum. Had a modera high dyspnea noted te chronic small vessel ischemic disease with multiple chronic lacunar infarcts. Intracranial atherosclerosis with moderate right paraclinoid ICA and right M2 stenoses. No large vessel occlusion. Patent cervical carotid and vertebral arteries without evidence of a significant stenosis other than at the right vertebral origin which is not well evaluated due to motion. 2D Echo pending LDL 131 mg percent  HgbA1c 6.8 VTE prophylaxis -SCDs    Diet   Diet regular Room service appropriate? Yes; Fluid consistency: Thin   No antithrombotic prior to admission, now on No antithrombotic. ICH Therapy recommendations:  pending Disposition:  pending  Hypertension Home meds:  None Seen in May 2021 for uncontrolled HTN, was recommended to take Norvasc 10mg  Unstable BP goal systolic [id number] Cleviprex IV PRN labetolol   Hyperlipidemia Home meds:  None, resumed in hospital LDL, goal < 70  Diabetes  type II Controlled Home meds:  none HgbA1c Pending ., goal < 7.0 CBGs Recent Labs    02/17/21 [place] 126*    SSI  Other Stroke Risk Factors Hx stroke/TIA Patient states that he has had 7 strokes in the past, these were seen on [facility] day # 2 Plan mobilize out of bed.  Physical and occupational therapy consults.  Continue strict control of hypertension with systolic goal below 476.  Use as needed IV labetalol and hydralazine and start Norvasc 5 mg daily.  Transfer to neurology floor bed when off Cleviprex drip.  Long discussion with the patient about need for strict blood pressure control and given that this is his recurrent stroke needs to be on antiplatelet blood pressure medications and not just  on diet control which clearly has not been enough.  Patient however appears to be reluctant to take allopathic blood pressure medications long-term  This patient is critically ill and at significant risk of neurological worsening, death and care requires constant monitoring of vital signs, hemodynamics,respiratory and cardiac monitoring, extensive review of multiple databases, frequent neurological assessment, discussion with family, other specialists and medical decision making of high complexity.I have made any additions or clarifications directly to the above note.This critical care time does not reflect procedure time, or teaching time or supervisory time of PA/NP/Med Resident etc but could involve care discussion time.  I spent 30 minutes of neurocritical care time  in the care of  this patient.    Antony Contras, MD  To contact Stroke Continuity provider, please refer to [email or web address] After hours, contact General Neurology

## 2021-02-19 NOTE — Progress Notes (Signed)
Inpatient Rehab Admissions Coordinator Note:   Per PT/OT patient was screened for CIR candidacy by Kaoir Loree Danford Bad, CCC-SLP. At this time, pt appears to be a potential candidate for CIR. I will place an order for rehab consult for full assessment, per our protocol.  Please contact me any with questions.Gayland Curry, [place], [place] Admissions Coordinator [phone] 02/19/21 3:29 PM

## 2021-02-19 NOTE — Progress Notes (Signed)
OT EVALUATION  PT admitted with CVA. Pt currently with functional limitiations due to the deficits listed below (see OT problem list). Pt noted to have balance and cognition deficits. Pt reports not showering for 9 days due to no water/ heat in apartment. Pt also indorses that his mother lives 200 ft away in a second apartment and he didn't shower there because they got up after he did. Pt demonstrates problem solving concerns throughout session. Pt very insistent on being indep with all aspects of all activities and needs verbal enforcement for why staff (A) can help aide him. Pt will benefit from skilled OT to increase their independence and safety with adls and balance to allow discharge CIR. Pt reports wanting to go home as soon as possible because "people just get sicker staying in a hospital. 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     02/19/21 0800  OT Visit Information  Last OT Received On 02/19/21  Assistance Needed +2  Reason for Co-Treatment To address functional/ADL transfers  PT goals addressed during session Mobility/safety with mobility  History of Present Illness 51 yo male admited code stroke with L side weakness and difficulty speaking. CT intraparenchymal hemorrhage of basal ganglia. 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  HTN DM R Toe amputation, nonadherence to medications  Precautions  Precautions Fall  Restrictions  Weight Bearing Restrictions No  Home Living  Family/patient expects to be discharged to: Private residence  Living Arrangements Children (son 11 yo [place])  Available Help at Discharge Family;Available PRN/intermittently  Type of Home Apartment  Home Access Level entry  Home Layout One level  Bathroom Shower/Tub Tub/shower unit  Automotive engineer Grab bars - tub/shower  Prior Function  Prior Level of Function  Independent/Modified Independent  Mobility Comments no AD, drives,  does grocery shopping  ADLs Comments indep  Communication  Communication Other (comment) (pt noted to have some L side facial weakness but able to understand what pt is expressing)  Pain Assessment  Pain Assessment No/denies pain  Cognition  Arousal/Alertness Awake/alert  Behavior During Therapy Impulsive;Restless  Overall Cognitive Status Impaired/Different from baseline  Area of Impairment Orientation  Orientation Level Time (reports 1/3. reports he has been here for 3 days.)  General Comments pt reports living 200 ft from his mother but not showering in 9 days. when asked why he wouldnt shower at mothers house and pt reports I get up before they do.  Upper Extremity Assessment  Upper Extremity Assessment Generalized weakness (ataxic movement)  Lower Extremity Assessment  Lower Extremity Assessment Defer to PT evaluation  Cervical / Trunk Assessment  Cervical / Trunk Assessment Normal  Vision- History  Baseline Vision/History 1 Wears glasses  Vision- Assessment  Additional Comments driving only per patient  ADL  Overall ADL's  Needs assistance/impaired  Eating/Feeding Set up  Eating/Feeding Details (indicate cue type and reason) drinking from cup and noted to choke on water. pt unable to speak after and states when recovered "i have never coughed as much as i have in my life since ive been here" pt reports "its not the water its my coughing"  Grooming Wash/dry face;Min guard;Sitting  Lower Body Dressing Minimal assistance;Sit to/from stand  Lower Body Dressing Details (indicate cue type and reason) pt attempting to don pants supine with R UE partially pinned due to lines but declining help. OT had to visually show patient the reason he was not able  to reach further. OT had to reinforce letting pt attempt task but needing to help for a moment. pt failed don supine and OT suggested eob. pt unable to loop pants over foot initially and after multiple failed attempts able to complete. pt  needs (A) for sit<>stand balance. Pt very guards to (A) and requires verbal reassurance that he can maximize his attempt  Toilet Transfer Moderate assistance;Rolling walker (2 wheels);BSC/3in1  Functional mobility during ADLs Moderate assistance;Rolling walker (2 wheels)  General ADL Comments pt giving reasons throughout sessions why his movement is the way it currently is during transfers. OT/Pt continues to reinforce and show value in why therapy is needed. pt reports not wanting to stay a day longer than needed in the hopsital because people getting sicker just being here. Pt expressed "i know i workd in a hospital" pt very distracted at times by mother not arriving at hospital yet and calling him two times during session lost.  Bed Mobility  Overal bed mobility Needs Assistance  Bed Mobility Supine to Sit;Rolling  Rolling Supervision  Supine to sit Min assist  General bed mobility comments pt exiting the bed on the L side. pt needs increased time and again verbal cues to allow staff to help him due to lines/ leads.  Transfers  Overall transfer level Needs assistance  Equipment used Rolling walker (2 wheels)  Transfers Sit to/from Stand  Sit to Stand Min assist  General transfer comment pt pulling on rw despite cues to push from bed surface. pt expressed he doesnt need a rw. pt states "me and a walker dont get along" yet pt reaching outside base of support for the walker. pt needs cues for safety inside the rw  Balance  Overall balance assessment Needs assistance  Sitting-balance support Bilateral upper extremity supported;Feet supported  Sitting balance-Leahy Scale Fair  Standing balance support Bilateral upper extremity supported;Reliant on assistive device for balance  Standing balance-Leahy Scale Poor  General Comments  General comments (skin integrity, edema, etc.) BP 158/129 with initial bed mobility. RN present and increased IV medications. pt finishing session 130/62.  Exercises   Exercises Other exercises  Other Exercises  Other Exercises pt asking for 2 lb weights to work out sitting in the chair. pt expressed that he does arm exercises between work calls. pt educated to use water bottles in the room and that weights are not available to be given out at this time.  Other Exercises pt states he has to leave as soon as possible because he doesn t want to loose his job. pt advised to speak with MD about FMLA paperwork to help give him more time to recover.  Other Exercises pt demonstratse cognitive deficits with problem solving throughout session but is aware of location, reason for admission. Further testing for executive function is needed. pt drives. and has a dog  OT - End of Session  Equipment Utilized During Treatment Gait belt;Rolling walker (2 wheels)  Activity Tolerance Patient tolerated treatment well  Patient left in chair;with call bell/phone within reach;with chair alarm set  Nurse Communication Mobility status;Precautions  OT Assessment  OT Recommendation/Assessment Patient needs continued OT Services  OT Visit Diagnosis Unsteadiness on feet (R26.81);Muscle weakness (generalized) (M62.81)  OT Problem List Decreased strength;Decreased activity tolerance;Impaired balance (sitting and/or standing);Decreased cognition;Decreased coordination;Decreased safety awareness;Decreased knowledge of use of DME or AE;Decreased knowledge of precautions;Cardiopulmonary status limiting activity  Barriers to Discharge Decreased caregiver support  Barriers to Discharge Comments reports parents will be able to help him.  pt reports son is out of town but can help him. when pushed further reports son return to classes on Fri Jan 6th  OT Plan  OT Frequency (ACUTE ONLY) Min 3X/week  OT Treatment/Interventions (ACUTE ONLY) Self-care/ADL training;Therapeutic exercise;Neuromuscular education;Energy conservation;DME and/or AE instruction;Manual therapy;Modalities;Therapeutic  activities;Cognitive remediation/compensation;Patient/family education;Balance training  AM-PAC OT "6 Clicks" Daily Activity Outcome Measure (Version 2)  Help from another person eating meals? 3  Help from another person taking care of personal grooming? 3  Help from another person toileting, which includes using toliet, bedpan, or urinal? 3  Help from another person bathing (including washing, rinsing, drying)? 3  Help from another person to put on and taking off regular upper body clothing? 3  Help from another person to put on and taking off regular lower body clothing? 3  6 Click Score 18  Progressive Mobility  What is the highest level of mobility based on the progressive mobility assessment? Level 5 (Walks with assist in room/hall) - Balance while stepping forward/back and can walk in room with assist - Complete  Mobility Ambulated with assistance in room  OT Recommendation  Recommendations for Other Services Rehab consult  Follow Up Recommendations Acute inpatient rehab (3hours/day)  Assistance recommended at discharge Set up Supervision/Assistance  Functional Status Assessent Patient has had a recent decline in their functional status and demonstrates the ability to make significant improvements in function in a reasonable and predictable amount of time.  OT Equipment Other (comment) (RW)  Individuals Consulted  Consulted and Agree with Results and Recommendations Patient  Acute Rehab OT Goals  Patient Stated Goal to go home and return to work  OT Goal Formulation With patient  Time For Goal Achievement 03/05/21  Potential to Achieve Goals Good  OT Time Calculation  OT Start Time (ACUTE ONLY) 1228  OT Stop Time (ACUTE ONLY) 1324  OT Time Calculation (min) 56 min  OT General Charges  $OT Visit 1 Visit  OT Evaluation  $OT Eval Moderate Complexity 1 Mod  Written Expression  Dominant Hand Right   Brynn, OTR/L  Acute Rehabilitation Services Pager: [phone] Office:  [phone] .

## 2021-02-19 NOTE — TOC CAGE-AID Note (Signed)
Transition of Care [facility]) - CAGE-AID Screening   Patient Details  Name: Hermann Dottavio MRN: [id number] Date of Birth: [birth year]  Transition of Care [facility]) CM/SW Contact:    [place], [place] Phone Number: 02/19/2021, 9:44 AM   Clinical Narrative: Pt is unable to participate in Cage Aid.  Rada Zegers Tarpley-Carter, MSW, LCSW-A Pronouns:  She/Her/Hers Plato Transitions of Care Clinical Social Worker Direct Number:  [phone] [email or web address]  CAGE-AID Screening: Substance Abuse Screening unable to be completed due to: : Patient unable to participate             Substance Abuse Education Offered: No

## 2021-02-20 LAB — CBC
HCT: 37.6 % — ABNORMAL LOW (ref 39.0–52.0)
Hemoglobin: 12.9 g/dL — ABNORMAL LOW (ref 13.0–17.0)
MCH: 31.6 pg (ref 26.0–34.0)
MCHC: 34.3 g/dL (ref 30.0–36.0)
MCV: 92.2 fL (ref 80.0–100.0)
Platelets: 290 10*3/uL (ref 150–400)
RBC: 4.08 MIL/uL — ABNORMAL LOW (ref 4.22–5.81)
RDW: 12.1 % (ref 11.5–15.5)
WBC: 5.1 10*3/uL (ref 4.0–10.5)
nRBC: 0 % (ref 0.0–0.2)

## 2021-02-20 LAB — BASIC METABOLIC PANEL
Anion gap: 11 (ref 5–15)
BUN: 39 mg/dL — ABNORMAL HIGH (ref 6–20)
CO2: 24 mmol/L (ref 22–32)
Calcium: 8.9 mg/dL (ref 8.9–10.3)
Chloride: 104 mmol/L (ref 98–111)
Creatinine, Ser: 4.88 mg/dL — ABNORMAL HIGH (ref 0.61–1.24)
GFR, Estimated: 14 mL/min — ABNORMAL LOW (ref >60)
Glucose, Bld: 111 mg/dL — ABNORMAL HIGH (ref 70–99)
Potassium: 3.9 mmol/L (ref 3.5–5.1)
Sodium: 139 mmol/L (ref 135–145)

## 2021-02-20 MED ORDER — AMLODIPINE BESYLATE 5 MG PO TABS
5.0000 mg | ORAL_TABLET | Freq: Once | ORAL | Status: AC
  Administered 2021-02-20: 5 mg via ORAL
  Filled 2021-02-20: qty 1

## 2021-02-20 MED ORDER — AMLODIPINE BESYLATE 10 MG PO TABS
10.0000 mg | ORAL_TABLET | Freq: Every day | ORAL | Status: DC
  Administered 2021-02-21 – 2021-02-23 (×3): 10 mg via ORAL
  Filled 2021-02-20 (×3): qty 1

## 2021-02-20 MED ORDER — HYDRALAZINE HCL 20 MG/ML IJ SOLN
20.0000 mg | Freq: Four times a day (QID) | INTRAMUSCULAR | Status: DC | PRN
  Administered 2021-02-20 – 2021-02-21 (×3): 20 mg via INTRAVENOUS
  Filled 2021-02-20 (×4): qty 1

## 2021-02-20 MED ORDER — LABETALOL HCL 5 MG/ML IV SOLN
20.0000 mg | INTRAVENOUS | Status: DC | PRN
  Administered 2021-02-20: 20 mg via INTRAVENOUS
  Filled 2021-02-20: qty 4

## 2021-02-20 NOTE — Progress Notes (Signed)
STROKE TEAM PROGRESS NOTE   INTERVAL HISTORY His RN is at the bedside.  He is still on Cleviprex drip for blood pressure control.  Patient is sitting comfortably in the bedside chair.  Therapies have recommended acute inpatient rehab.  Patient seems reluctant to do so and states that he wants to go home and will have somebody drive him for outpatient therapy.  Is willing to take short-term blood pressure medications but is not sure he will take medications long-term.  Vitals:   02/20/21 1115 02/20/21 1200 02/20/21 1300 02/20/21 1400  BP: (!) 143/88 (!) 152/89 121/74 (!) 157/96  Pulse:      Resp: (!) 31 19 13 14   Temp:  97.8 F (36.6 C)    TempSrc:  Oral    SpO2:  99%  98%  Weight:      Height:       CBC:  Recent Labs  Lab 02/17/21 2012 02/17/21 2020 02/19/21 0600 02/20/21 0546  WBC 6.2  --  5.4 5.1  NEUTROABS 4.7  --   --   --   HGB 14.1   < > 12.7* 12.9*  HCT 42.0   < > 36.6* 37.6*  MCV 93.5  --  91.7 92.2  PLT 316  --  264 290   < > = values in this interval not displayed.   Basic Metabolic Panel:  Recent Labs  Lab 02/19/21 0600 02/20/21 0546  NA 138 139  K 4.1 3.9  CL 105 104  CO2 25 24  GLUCOSE 114* 111*  BUN 45* 39*  CREATININE 5.41* 4.88*  CALCIUM 8.9 8.9   Lipid Panel:  Recent Labs  Lab 02/18/21 1500 02/19/21 0600  CHOL 251*  --   TRIG 82 154*  HDL 104  --   CHOLHDL 2.4  --   VLDL 16  --   LDLCALC 131*  --    HgbA1c:  Recent Labs  Lab 02/18/21 1500  HGBA1C 6.8*   Urine Drug Screen:  Recent Labs  Lab 02/17/21 2115  LABOPIA NONE DETECTED  COCAINSCRNUR NONE DETECTED  LABBENZ NONE DETECTED  AMPHETMU NONE DETECTED  THCU NONE DETECTED  LABBARB NONE DETECTED    Alcohol Level  Recent Labs  Lab 02/17/21 2012  ETH <10    IMAGING past 24 hours No results found.  PHYSICAL EXAM  Temp:  [97.8 F (36.6 C)-98.6 F (37 C)] 97.8 F (36.6 C) (01/03 1200) Pulse Rate:  [72-90] 89 (01/03 0930) Resp:  [12-31] 14 (01/03 1400) BP:  (118-167)/(74-139) 157/96 (01/03 1400) SpO2:  [93 %-100 %] 98 % (01/03 1400)  General - Well nourished, well developed middle-aged African-American male, in no apparent distress. Ophthalmologic - fundi not visualized due to noncooperation. Cardiovascular - Regular rhythm and rate.  Mental Status -  Level of arousal and orientation to time, place, and person were intact. Language including expression, naming, repetition, comprehension was assessed and found intact. Recent and remote memory were intact. Fund of Knowledge was assessed and was intact.  Cranial Nerves II - XII - II - Visual field intact OU. III, IV, VI - Right eye is slightly esotropic, patient states this is baseline from a previous stroke V - Facial sensation intact bilaterally. VII -mild left facial droop VIII - Hearing & vestibular intact bilaterally. X - Palate elevates symmetrically. XI - Chin turning & shoulder shrug intact bilaterally. XII - Tongue protrusion intact.  Motor Strength - The patients strength was normal in all extremities except for left UE drift.  Bulk was normal and fasciculations were absentMotor Tone - Muscle tone was assessed at the neck and appendages and was normal.  Diminished fine finger movements on the left and orbits right over left upper extremity.  Trace left grip weakness. Reflexes - The patients reflexes were symmetrical in all extremities and he had no pathological reflexes. Sensory - Light touch, temperature/pinprick were assessed and were symmetrical Coordination - Prominent dysmetria with left FNF, slight LLE ataxia with HTS Gait and Station - deferred.   ASSESSMENT/PLAN Carlos Rice is an 51 y.o. male with a PMH of uncontrolled HTN (noncompliant with medications), right toe amputation, prior stroke with right sided weakness in 2016 who presents to the hospital via EMS as a Code Stroke after he was noted to acutely be weak on his left side following a fall out of his car. The  patient states that while getting out of his car, he suddenly felt unsteady and then was witnessed to fall to the ground. He states that when he tried to get back up, he was too weak on his left side to do so. EMS was called and he was brought to the ED. He was severely hypertensive en route, with a BP of 250/146. CBG was 147. The patient states that when he was diagnosed with his first clinically apparent stroke in 2016, he was told that his MRI showed "7 strokes" including the new one that he presented with at that time.   Stroke: acute hemorrhage involving the right lentiform nucleus and external capsule likely secondary to uncontrolled hypertension Code Stroke -CT head reveals an acute hemorrhage involving the right lentiform nucleus and external capsule measuring 3.0 x 1.3 x 2.5 cm (estimated volume of 4.9 mL). There is minimal surrounding edema. Patchy hypodensities in the cerebral white matter bilaterally are nonspecific but compatible with moderate chronic small vessel ischemic disease MRI/ MRA- Unchanged 3 cm right basal ganglia hemorrhage.  Mild edema. Possible nearby punctate acute infarct in the right parietal operculum. Had a modera high dyspnea noted te chronic small vessel ischemic disease with multiple chronic lacunar infarcts. Intracranial atherosclerosis with moderate right paraclinoid ICA and right M2 stenoses. No large vessel occlusion. Patent cervical carotid and vertebral arteries without evidence of a significant stenosis other than at the right vertebral origin which is not well evaluated due to motion. 2D Echo pending LDL 131 mg percent  HgbA1c 6.8 VTE prophylaxis -SCDs    Diet   Diet regular Room service appropriate? Yes; Fluid consistency: Thin   No antithrombotic prior to admission, now on No antithrombotic. ICH Therapy recommendations: CLR  disposition:  pending  Hypertension Home meds:  None Seen in May 2021 for uncontrolled HTN, was recommended to take Norvasc  10mg  Unstable BP goal systolic 650-354 Cleviprex IV PRN labetolol   Hyperlipidemia Home meds:  None, resumed in hospital LDL, goal < 70  Diabetes type II Controlled Home meds:  none HgbA1c Pending ., goal < 7.0 CBGs Recent Labs    02/17/21 [place] 126*    SSI  Other Stroke Risk Factors Hx stroke/TIA Patient states that he has had 7 strokes in the past, these were seen on [facility] day # 3 Plan continue mobilization out of bed and ongoing physical and occupational therapy consults.  Continue strict control of hypertension with systolic goal below 656.  Use as needed IV labetalol and hydralazine and increase Norvasc 10 mg daily.  Transfer to neurology floor bed when off Cleviprex drip.  Long discussion  with the patient about need for strict blood pressure control and given that this is his recurrent stroke needs to be on antiplatelet blood pressure medications and not just on diet control which clearly has not been enough.  Patient however appears to be reluctant to take allopathic blood pressure medications long-term  This patient is critically ill and at significant risk of neurological worsening, death and care requires constant monitoring of vital signs, hemodynamics,respiratory and cardiac monitoring, extensive review of multiple databases, frequent neurological assessment, discussion with family, other specialists and medical decision making of high complexity.I have made any additions or clarifications directly to the above note.This critical care time does not reflect procedure time, or teaching time or supervisory time of PA/NP/Med Resident etc but could involve care discussion time.  I spent 30 minutes of neurocritical care time  in the care of  this patient.    Antony Contras, MD  To contact Stroke Continuity provider, please refer to [email or web address] After hours, contact General Neurology

## 2021-02-20 NOTE — Progress Notes (Signed)
Physical Therapy Treatment Patient Details Name: Carlos Rice MRN: [id number] DOB: 03-16-1970 Today's Date: 02/20/2021   History of Present Illness 51 yo male admited code stroke with L side weakness and difficulty speaking. CT intraparenchymal hemorrhage of basal ganglia. 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 I"m going to loose my job and then I can't affort my apartment. I can't rack up a 50,000 hospital bill." However pt reports he doesn't have water, heat, or electric in his apartment. Pt also continues to feel he can rehab himself back to "normal." Pt continues to present with L sided weakness, impaired co-ordination, impaired balance, requires use of RW and mod/maxA for safe ambulation due to ataxia and inability to maintain upright posture with strong anterior bias. Pt remains to have decreased insight to safety and deficits. Strongly recommend pt undergoing CIR upon d/c for maximal functional recovery. Acute PT to cont to follow.    Recommendations for follow up therapy are one component of a multi-disciplinary discharge planning process, led by the attending physician.  Recommendations may be updated based on patient status, additional functional criteria and insurance authorization.  Follow Up Recommendations  Acute inpatient rehab (3hours/day)     Assistance Recommended at Discharge Frequent or constant Supervision/Assistance  Patient can return home with the following     Equipment Recommendations  Rolling walker (2 wheels)    Recommendations for Other Services Rehab consult     Precautions / Restrictions Precautions Precautions: Fall Restrictions Weight Bearing Restrictions: No     Mobility  Bed Mobility Overal bed mobility: Needs Assistance Bed Mobility: Supine to Sit     Supine to sit: Min assist     General bed mobility comments:  labored effort, trying to roll self up to EOB, minA to complete trunk elevation, difficulty using L UE to assist due to fine motor impairement    Transfers Overall transfer level: Needs assistance Equipment used: Rolling walker (2 wheels) Transfers: Sit to/from Stand Sit to Stand: Min assist           General transfer comment: max directional verbal cues for safety and hand placement, pt steadies himself with back of legs on the bed, pt with wide base of support, continues to report the "me and walker don't get along" but continues to reach for one, pt very shaky, ataxic like    Ambulation/Gait Ambulation/Gait assistance: Mod assist;+2 safety/equipment;Max assist Gait Distance (Feet): 40 Feet Assistive device: Rolling walker (2 wheels) Gait Pattern/deviations: Step-through pattern;Decreased stride length;Decreased dorsiflexion - left;Trunk flexed;Wide base of support;Ataxic Gait velocity: dec Gait velocity interpretation: <1.8 ft/sec, indicate of risk for recurrent falls   General Gait Details: pt very dependent on UEs, verbal cues to grip walker with L hand, pt with difficulty due to fine motor deficit, pt requiring modA for walker managemet as pt pushing walker to far out in front despite constant verbal cues, pt with ataxic like gait pattern with noted mild L drop foot, pt very unsteady and unsafe requiring mod to maxA via gait belt to prevent pt from falling fowrad due to pt not listening to verbal cues given by PT, 2nd person for IV pole and line mangement   Stairs             Wheelchair Mobility    Modified Rankin (Stroke Patients Only) Modified Rankin (Stroke Patients Only) Pre-Morbid Rankin Score: Slight  disability Modified Rankin: Moderately severe disability     Balance Overall balance assessment: Needs assistance Sitting-balance support: Bilateral upper extremity supported;Feet supported Sitting balance-Leahy Scale: Fair Sitting balance - Comments: pt bent  over to don sneakers, attempt to tie shoes however ultimately unable due to impaired fine dexterity t/o bilat hands   Standing balance support: Bilateral upper extremity supported;Reliant on assistive device for balance Standing balance-Leahy Scale: Poor Standing balance comment: pt attempted to stand at bedside without UE support, pt with very wide base of support, shakiness and noted bilat knee instability causing pt to reach to grab for something to hold onto, ie PT or walker                            Cognition Arousal/Alertness: Awake/alert Behavior During Therapy: Impulsive (irritated about still being in the hospital) Overall Cognitive Status: No family/caregiver present to determine baseline cognitive functioning                                 General Comments: no family to report pt's PLOF, suspect pt is at baseline from cognitive stand point based on his choices of non-compliance with meds, diet of red fruits only, and perseveration on movement. Pt also perseverating on needing to go home so he can work because he doesn't work then he can't afford his apartment, however his apartment doesn't have water or electric. Pt continues with decresaed insight to deficits and safety        Exercises      General Comments General comments (skin integrity, edema, etc.): RN increased cleveprex to decrease BP below parameters to allow pt to amb. BP 154/78, HR and SpO2 stable      Pertinent Vitals/Pain Pain Assessment: No/denies pain    Home Living                          Prior Function            PT Goals (current goals can now be found in the care plan section) Acute Rehab PT Goals Patient Stated Goal: home Progress towards PT goals: Progressing toward goals    Frequency    Min 4X/week      PT Plan Current plan remains appropriate    Co-evaluation              AM-PAC PT "6 Clicks" Mobility   Outcome Measure  Help needed  turning from your back to your side while in a flat bed without using bedrails?: A Little Help needed moving from lying on your back to sitting on the side of a flat bed without using bedrails?: A Little Help needed moving to and from a bed to a chair (including a wheelchair)?: A Lot Help needed standing up from a chair using your arms (e.g., wheelchair or bedside chair)?: A Lot Help needed to walk in hospital room?: A Lot Help needed climbing 3-5 steps with a railing? : Total 6 Click Score: 13    End of Session Equipment Utilized During Treatment: Gait belt Activity Tolerance: Patient tolerated treatment well Patient left: in chair;with call bell/phone within reach;with chair alarm set Nurse Communication: Mobility status PT Visit Diagnosis: Unsteadiness on feet (R26.81);Muscle weakness (generalized) (M62.81);Difficulty in walking, not elsewhere classified (R26.2)     Time: 7510-2585 PT Time Calculation (min) (ACUTE ONLY): 32 min  Charges:  $  Gait Training: 8-22 mins $Neuromuscular Re-education: 8-22 mins                     Kittie Plater, PT, DPT Acute Rehabilitation Services Pager #: [phone] Office #: [phone]    Berline Lopes 02/20/2021, 1:48 PM

## 2021-02-20 NOTE — Progress Notes (Signed)
Inpatient Rehab Admissions Coordinator:   Met with patient at bedside to discuss recommendations for CIR.  I explained average length of stay 2 weeks, dependent upon progress, and 3 hrs/day of therapy with goals of supervision at discharge.  Pt states he lives with his son, who is home during the day and "grown".  Though this is not reflected anywhere else in the chart.  He is reluctant to discuss dispo with me.  States he would have a ride to outpatient therapies and he is not interested in discussing CIR further at this time.   Shann Medal, PT, DPT Admissions Coordinator [phone] 02/20/21  3:59 PM

## 2021-02-21 ENCOUNTER — Other Ambulatory Visit (HOSPITAL_COMMUNITY): Payer: Self-pay

## 2021-02-21 LAB — BASIC METABOLIC PANEL
Anion gap: 7 (ref 5–15)
BUN: 39 mg/dL — ABNORMAL HIGH (ref 6–20)
CO2: 25 mmol/L (ref 22–32)
Calcium: 8.9 mg/dL (ref 8.9–10.3)
Chloride: 107 mmol/L (ref 98–111)
Creatinine, Ser: 5.27 mg/dL — ABNORMAL HIGH (ref 0.61–1.24)
GFR, Estimated: 12 mL/min — ABNORMAL LOW (ref >60)
Glucose, Bld: 105 mg/dL — ABNORMAL HIGH (ref 70–99)
Potassium: 4.2 mmol/L (ref 3.5–5.1)
Sodium: 139 mmol/L (ref 135–145)

## 2021-02-21 LAB — CBC
HCT: 36.8 % — ABNORMAL LOW (ref 39.0–52.0)
Hemoglobin: 12.5 g/dL — ABNORMAL LOW (ref 13.0–17.0)
MCH: 31.6 pg (ref 26.0–34.0)
MCHC: 34 g/dL (ref 30.0–36.0)
MCV: 93.2 fL (ref 80.0–100.0)
Platelets: 270 10*3/uL (ref 150–400)
RBC: 3.95 MIL/uL — ABNORMAL LOW (ref 4.22–5.81)
RDW: 12 % (ref 11.5–15.5)
WBC: 4.7 10*3/uL (ref 4.0–10.5)
nRBC: 0 % (ref 0.0–0.2)

## 2021-02-21 NOTE — Progress Notes (Signed)
Occupational Therapy Treatment Patient Details Name: Carlos Rice MRN: [id number] DOB: Feb 26, 1970 Today's Date: 02/21/2021   History of present illness 50 yo male admited code stroke with L side weakness and difficulty speaking. CT intraparenchymal hemorrhage of basal ganglia. 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  Impulsive with movement and functional tasks.  Decreased insight into deficits.  Patient is now willing to go to [facility], which is appropriate.  He would benefit from an aggressive multi disciplined approach to rehab prior to retuning home and back to work.  OT will continue efforts in the acute setting.     Recommendations for follow up therapy are one component of a multi-disciplinary discharge planning process, led by the attending physician.  Recommendations may be updated based on patient status, additional functional criteria and insurance authorization.    Follow Up Recommendations  Acute inpatient rehab (3hours/day)    Assistance Recommended at Discharge Set up Supervision/Assistance  Patient can return home with the following  A lot of help with walking and/or transfers;Assistance with cooking/housework;Direct supervision/assist for financial management;Assist for transportation;Direct supervision/assist for medications management   Equipment Recommendations       Recommendations for Other Services      Precautions / Restrictions Precautions Precautions: Fall Restrictions Weight Bearing Restrictions: No       Mobility Bed Mobility Overal bed mobility: Needs Assistance Bed Mobility: Supine to Sit Rolling: Supervision   Supine to sit: Min assist     General bed mobility comments: Up in recliner    Transfers Overall transfer level: Needs assistance Equipment used: 1 person hand held assist Transfers: Sit to/from Stand Sit to Stand:  Min guard           General transfer comment: max directional verbal cues for safety and hand placement, pt steadies himself with back of legs on the bed, pt with wide base of support, continues to report the "me and walker don't get along" but continues to reach for one, pt very shaky, ataxic like     Balance Overall balance assessment: Needs assistance Sitting-balance support: Feet supported Sitting balance-Leahy Scale: Fair Sitting balance - Comments: pt easily distracted and unable to focus on donning sneaker, pt kept dropping sneaker with L hand due to inability to grasp, pt near fall x 2 off EOB when trying to don sneakers today utlimitely requiring assist to don both sneakers   Standing balance support: Single extremity supported Standing balance-Leahy Scale: Poor Standing balance comment: dependent on external support                           ADL either performed or assessed with clinical judgement   ADL       Grooming: Wash/dry face;Oral care;Minimal assistance;Standing                   Toilet Transfer: Moderate assistance;Ambulation;Regular Toilet           Functional mobility during ADLs: Moderate assistance;Cueing for sequencing;Cueing for safety General ADL Comments: HHA    Extremity/Trunk Assessment Upper Extremity Assessment Upper Extremity Assessment: Overall WFL for tasks assessed       Cervical / Trunk Assessment Cervical / Trunk Assessment: Normal                      Cognition Arousal/Alertness: Awake/alert Behavior During Therapy: Impulsive Overall  Cognitive Status: No family/caregiver present to determine baseline cognitive functioning                                 General Comments: pt continues to have decreased insight to deficits, insight to ability to care for self in safe manor and decresaed insight to ability to do work duties. Pt perseverating on how much he needs to go home due to "the  financial peace, no one seems to care about". despite several near falls with PT pt continues to feel he can go home and rehab himself                      General Comments RN gave pt BP meds, 145/68    Pertinent Vitals/ Pain       Pain Assessment: No/denies pain                                                          Frequency  Min 3X/week        Progress Toward Goals  OT Goals(current goals can now be found in the care plan section)  Progress towards OT goals: Progressing toward goals  Acute Rehab OT Goals OT Goal Formulation: With patient Time For Goal Achievement: 03/05/21 Potential to Achieve Goals: Good  Plan Discharge plan remains appropriate    Co-evaluation                 AM-PAC OT "6 Clicks" Daily Activity     Outcome Measure   Help from another person eating meals?: A Little Help from another person taking care of personal grooming?: A Little Help from another person toileting, which includes using toliet, bedpan, or urinal?: A Little Help from another person bathing (including washing, rinsing, drying)?: A Little Help from another person to put on and taking off regular upper body clothing?: A Little Help from another person to put on and taking off regular lower body clothing?: A Little 6 Click Score: 18    End of Session Equipment Utilized During Treatment: Gait belt  OT Visit Diagnosis: Unsteadiness on feet (R26.81);Muscle weakness (generalized) (M62.81)   Activity Tolerance Patient tolerated treatment well   Patient Left in chair;with call bell/phone within reach;with chair alarm set   Nurse Communication          Time: 1950-9326 OT Time Calculation (min): 21 min  Charges: OT General Charges $OT Visit: 1 Visit OT Treatments $Self Care/Home Management : 8-22 mins  02/21/2021  RP, OTR/L  Acute Rehabilitation Services  Office:  [phone]   Metta Clines 02/21/2021, 2:55 PM

## 2021-02-21 NOTE — PMR Pre-admission (Signed)
PMR Admission Coordinator Pre-Admission Assessment  Patient: Carlos Rice is an 51 y.o., male MRN: [id number] DOB: 01-Nov-1970 Height: _0  (172.7 cm) Weight: 85.4 kg  Insurance Information HMO:     PPO:      PCP:      IPA:      80/20:      OTHER:  PRIMARY: [employer]      Policy#: [id number]    Subscriber: pt CM Name: Jenkins Rouge      Phone#: [phone] ext [phone]     Fax#: [phone] Pre-Cert#: [id number] [place] for CIR from Myriam Jacobson at [facility] with updates due to Fraser Din E at fax listed above on 1/11 (ext [phone])      Employer:  Benefits:  Phone #: [phone]     Name:  Eff. Date: 02/19/19     Deduct: $0      Out of Pocket Max: $3400 ($0 met)      Life Max: n/a CIR: $150/day for days 1-9 ($1200 total)      SNF: 20 full days Outpatient:      Co-Pay: $15-$30/day Home Health: 100%      Co-Pay:  DME: 80%     Co-Ins: 20% Providers:  SECONDARY:       Policy#:      Phone#:   Development worker, community:       Phone#:   The Actuary for patients in Inpatient Rehabilitation Facilities with attached Privacy Act [place] Records was provided and verbally reviewed with: Patient and Family  Emergency Contact Information Contact Information     Name Relation Home Work Mobile   Horacek,Bobby Mother   [phone]   Vannest,Mosi Father   [phone]       Current Medical History  Patient Admitting Diagnosis: CVA   History of Present Illness: Pt is a 51 y/o male with PMH of uncontrolled HTN (noncompliant with medications), right toe amputation, prior stroke with R sided weakness in 2016, who presents to [facility] on 02/17/21 via EMS as a Code Stroke after he was noted to be acutely weak on his left side following a fall out of his car.  In ED pt severely hypertensive with BP 250/146, CBG 147.  CT showed acute hemorrhage involving R lentiform nucleus and external capsule measuring 3.0x1.3x2.5 cm (estimated volume of 4.9 mL).  There is  minimal surrounding edema.  Patchy hypodensities in the cerebral white matter bilaterally are nonspecific but compatible with moderate chronic small vessel ischemic disease.  MRI/MRA showed 3 cm right basal ganglia hemorrhage with mild edema and possible nearby acute infarct in R parietal operculum.  Moderate stenosis in paraclinoid ICA and right M2 stenoses but no large vessel occlusion.  Pt refused 2D Echo stating there was nothing wrong with his heart.  HgA1C 6.8.  Pt initially required cleviprex for BP control.  Therapy ongoing and pt demos deficits in awareness and functional mobility.  Recommendations are for CIR.   Complete NIHSS TOTAL: 2  [facility] record [facility] has been reviewed by the rehabilitation admission coordinator and physician.  Past Medical History  No past medical history on file.  Has the patient had major surgery during 100 days prior to admission? No  Family History   family history is not on file.  Current Medications  Current Facility-Administered Medications:    acetaminophen (TYLENOL) tablet 650 mg, 650 mg, Oral, Q4H PRN **OR** acetaminophen (TYLENOL) 160 MG/5ML solution 650 mg, 650 mg, Per Tube, Q4H PRN **OR** acetaminophen (TYLENOL)  suppository 650 mg, 650 mg, Rectal, Q4H PRN, Kerney Elbe, MD   amLODipine (NORVASC) tablet 10 mg, 10 mg, Oral, Daily, [name], Pramod S, MD, 10 mg at 02/23/21 0849   Chlorhexidine Gluconate Cloth 2 % PADS 6 each, 6 each, Topical, Daily, Dorene Grebe, MD, 6 each at 02/23/21 0849   hydrALAZINE (APRESOLINE) injection 20 mg, 20 mg, Intravenous, Q6H PRN, Garvin Fila, MD, 20 mg at 02/21/21 1705   labetalol (NORMODYNE) injection 20 mg, 20 mg, Intravenous, Q2H PRN, [name], Pramod S, MD, 20 mg at 02/20/21 1519   pantoprazole (PROTONIX) EC tablet 40 mg, 40 mg, Oral, QHS, Karren Cobble, RPH, 40 mg at 02/21/21 2202   senna-docusate (Senokot-S) tablet 1 tablet, 1 tablet, Oral, BID, Kerney Elbe, MD, 1 tablet at  02/21/21 2202   sodium chloride flush (NS) 0.9 % injection 3 mL, 3 mL, Intravenous, Once, Sherwood Gambler, MD  Patients Current Diet:  Diet Order             Diet regular Room service appropriate? Yes; Fluid consistency: Thin  Diet effective now                   Precautions / Restrictions Precautions Precautions: Fall Precaution Comments: ataxia Restrictions Weight Bearing Restrictions: No   Has the patient had 2 or more falls or a fall with injury in the past year? 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 Independent  Indoor Mobility: Did the patient need assistance with walking from room to room (with or without device)? Independent  Stairs: Did the patient need assistance with internal or external stairs (with or without device)? Independent  Functional Cognition: Did the patient need help planning regular tasks such as shopping or remembering to take medications? Independent  Patient Information Are you of Hispanic, Latino/a,or Spanish origin?: A. No, not of Hispanic, Latino/a, or Spanish origin What is your race?: B. Black or African American Do you need or want an interpreter to communicate with a doctor or health care staff?: 0. No  Patient's Response To:  Health Literacy and Transportation Is the patient able to respond to health literacy and transportation needs?: Yes Health Literacy - How often do you need to have someone help you when you read instructions, pamphlets, or other written material from your doctor or pharmacy?: Never In the past 12 months, has lack of transportation kept you from medical appointments or from getting medications?: No In the past 12 months, has lack of transportation kept you from meetings, work, or from getting things needed for daily living?:  No  Development worker, international aid / [place] Devices/Equipment: None Home Equipment: Grab bars - tub/shower  Prior Device Use: Indicate devices/aids used by the patient prior to current illness, exacerbation or injury? None of the above  Current Functional Level Cognition  Overall Cognitive Status: No family/caregiver present to determine baseline cognitive functioning Current Attention Level: Selective Orientation Level: Oriented X4 Safety/Judgement: Decreased awareness of safety, Decreased awareness of deficits General Comments: feels his balance is not that much different than prior to this stroke, but showed me a video of him riding his recumbent bike with his dog on a leash and states no difficulty accessing the bike    Extremity Assessment (includes Sensation/Coordination)  Upper Extremity Assessment: Overall WFL for tasks assessed  Lower  Extremity Assessment: Generalized weakness (gross coordination deficits)    ADLs  Overall ADL's : Needs assistance/impaired Eating/Feeding: Set up Eating/Feeding Details (indicate cue type and reason): drinking from cup and noted to choke on water. pt unable to speak after and states when recovered "i have never coughed as much as i have in my life since ive been here" pt reports "its not the water its my coughing" Grooming: Wash/dry face, Oral care, Minimal assistance, Standing Lower Body Dressing: Minimal assistance, Sit to/from stand Lower Body Dressing Details (indicate cue type and reason): pt attempting to don pants supine with R UE partially pinned due to lines but declining help. OT had to visually show patient the reason he was not able to reach further. OT had to reinforce letting pt attempt task but needing to help for a moment. pt failed don supine and OT suggested eob. pt unable to loop pants over foot initially and after multiple failed attempts able to complete. pt needs (A) for sit<>stand balance. Pt very guards to (A) and  requires verbal reassurance that he can maximize his attempt Toilet Transfer: Moderate assistance, Ambulation, Regular Toilet Functional mobility during ADLs: Moderate assistance, Cueing for sequencing, Cueing for safety General ADL Comments: HHA    Mobility  Overal bed mobility: Needs Assistance Bed Mobility: Supine to Sit Rolling: Supervision Supine to sit: Min assist General bed mobility comments: for balance    Transfers  Overall transfer level: Needs assistance Equipment used: Rolling walker (2 wheels) Transfers: Sit to/from Stand Sit to Stand: Min guard General transfer comment: cues for balance/ weight shift over feet    Ambulation / Gait / Stairs / Wheelchair Mobility  Ambulation/Gait Ambulation/Gait assistance: Mod assist, +2 safety/equipment Gait Distance (Feet): 70 Feet (x 2) Assistive device: Rolling walker (2 wheels) Gait Pattern/deviations: Step-to pattern, Step-through pattern, Decreased stride length, Ataxic, Wide base of support, Shuffle, Decreased dorsiflexion - left General Gait Details: ataxic pattern with only couple instances with head and shoulders flexed forward corrected with cues for tucking hips; no issues with using walker today, but shaky and needing assist for balance cues for foot position; standing break in hallway to touch ornaments on Christmas Tree high and low with mod support when standing without UE support on walker Gait velocity: dec Gait velocity interpretation: <1.8 ft/sec, indicate of risk for recurrent falls    Posture / Balance Dynamic Sitting Balance Sitting balance - Comments: leaning over to don shoes, able to tie laces today, reports was unable yesterday due to L hand weakness Balance Overall balance assessment: Needs assistance Sitting-balance support: Feet supported Sitting balance-Leahy Scale: Good Sitting balance - Comments: leaning over to don shoes, able to tie laces today, reports was unable yesterday due to L hand  weakness Standing balance support: Reliant on assistive device for balance, Single extremity supported, No upper extremity supported Standing balance-Leahy Scale: Poor Standing balance comment: mod a with reaching when not holding walker    Special needs/care consideration Diabetic management yes   Previous Home Environment (from acute therapy documentation) Living Arrangements: Children (son 43 yo [name]) Available Help at Discharge: Family, Available PRN/intermittently Type of Home: Apartment Home Layout: One level Home Access: Level entry Bathroom Shower/Tub: Chiropodist: Standard Home Care Services: No  Discharge Living Setting Plans for Discharge Living Setting: Patient's home Type of Home at Discharge: Apartment Discharge Home Layout: One level Discharge Home Access: Level entry Discharge Bathroom Shower/Tub: Tub/shower unit Discharge Bathroom Toilet: Standard Discharge Bathroom Accessibility: Yes How Accessible: Accessible via  walker Does the patient have any problems obtaining your medications?: No  Social/Family/Support Systems Anticipated Caregiver: mom, Jaedon Siler, and dad Jesse Fall Anticipated Caregiver's Contact Information: Mortimer Fries [phone]; Delos Klich [phone] Ability/Limitations of Caregiver: min assist Caregiver Availability: 24/7 Discharge Plan Discussed with Primary Caregiver: Yes Is Caregiver In Agreement with Plan?: Yes Does Caregiver/Family have Issues with Lodging/Transportation while Pt is in Rehab?: No  Goals Patient/Family Goal for Rehab: PT/OT supervision, SLP supervision to mod I Expected length of stay: 8-11 days Pt/Family Agrees to Admission and willing to participate: Yes Program Orientation Provided & Reviewed with Pt/Caregiver Including Roles  & Responsibilities: Yes  Barriers to Discharge: Home environment access/layout, Insurance for SNF coverage, Medication compliance  Decrease burden of Care through IP  rehab admission: no  Possible need for SNF placement upon discharge: no  Patient Condition: I have reviewed medical records from [facility], spoken with CM, and patient and family member. I met with patient at the bedside for inpatient rehabilitation assessment.  Patient will benefit from ongoing PT, OT, and SLP, can actively participate in 3 hours of therapy a day 5 days of the week, and can make measurable gains during the admission.  Patient will also benefit from the coordinated team approach during an Inpatient Acute Rehabilitation admission.  The patient will receive intensive therapy as well as Rehabilitation physician, nursing, social worker, and care management interventions.  Due to safety, skin/wound care, disease management, medication administration, pain management, and patient education the patient requires 24 hour a day rehabilitation nursing.  The patient is currently mod +2 with mobility and basic ADLs.  Discharge setting and therapy post discharge at home with home health is anticipated.  Patient has agreed to participate in the Acute Inpatient Rehabilitation Program and will admit today.  Preadmission Screen Completed By:  Michel Santee, PT, DPT 02/23/2021 10:12 AM ______________________________________________________________________   Discussed status with Dr. Ranell Patrick on 02/23/21  at 10:13 AM  and received approval for admission today.  Admission Coordinator:  Michel Santee, PT, DPT time 10:13 AM Sudie Grumbling 02/23/21    Assessment/Plan: Diagnosis: ICH Does the need for close, 24 hr/day Medical supervision in concert with the patient's rehab needs make it unreasonable for this patient to be served in a less intensive setting? Yes Co-Morbidities requiring supervision/potential complications: overweight BMI 28.63, CKD, hyperglycemia, HTN, hyperlipidemia Due to bladder management, bowel management, safety, skin/wound care, disease management, medication administration, pain  management, and patient education, does the patient require 24 hr/day rehab nursing? Yes Does the patient require coordinated care of a physician, rehab nurse, PT, OT to address physical and functional deficits in the context of the above medical diagnosis(es)? Yes Addressing deficits in the following areas: balance, endurance, locomotion, strength, transferring, bowel/bladder control, bathing, dressing, feeding, grooming, toileting, and psychosocial support Can the patient actively participate in an intensive therapy program of at least 3 hrs of therapy 5 days a week? Yes The potential for patient to make measurable gains while on inpatient rehab is excellent Anticipated functional outcomes upon discharge from inpatient rehab: modified independent PT, modified independent OT, modified independent SLP Estimated rehab length of stay to reach the above functional goals is: 10-14 days Anticipated discharge destination: Home 10. Overall Rehab/Functional Prognosis: excellent   MD Signature: Leeroy Cha, MD

## 2021-02-21 NOTE — Progress Notes (Addendum)
Inpatient Rehab Admissions Coordinator:   Spoke briefly to patients mother regarding CIR.  Explained likely need for 24/7 supervision at discharge, maybe up to min assist, pending progress.  They live in the same apartment complex and she is agreeable to providing assist for patient.  She states he does have insurance [facility]), and pt confirms this to be true.  I've asked our admin to try and verify coverage.  If pt has [facility], will need prior auth for CIR and will open.    14:55: Note coverage found and updated chart.  We will start insurance auth for CIR.   Shann Medal, PT, DPT Admissions Coordinator [phone] 02/21/21  2:46 PM

## 2021-02-21 NOTE — Progress Notes (Addendum)
STROKE TEAM PROGRESS NOTE   INTERVAL HISTORY BP better controlled with norvasc and hydralazine PRN. Cleviprex is off. States he is also willing to take non pharmacologic measures to help manage his stress. We discussed using deep breathing, yoga, and therapy to help manage stress and blood pressure in addition to medication. Willing to go to rehab as long as he isn't here for weeks. Continue working with PT/OT. Spoke with RN about finding resources on managing stress and meditation.  Neurological exam is stable and unchanged He was ambulating with physical therapy but had clear imbalance and was leaning to 1 side Vitals:   02/21/21 0800 02/21/21 0900 02/21/21 0924 02/21/21 0932  BP: (!) 155/90 (!) 172/82 (!) 140/93 (!) 146/85  Pulse:    (!) 103  Resp: (!) 23 18 11 16   Temp: 98 F (36.7 C)     TempSrc: Oral     SpO2:    95%  Weight:      Height:       CBC:  Recent Labs  Lab 02/17/21 2012 02/17/21 2020 02/20/21 0546 02/21/21 0459  WBC 6.2   < > 5.1 4.7  NEUTROABS 4.7  --   --   --   HGB 14.1   < > 12.9* 12.5*  HCT 42.0   < > 37.6* 36.8*  MCV 93.5   < > 92.2 93.2  PLT 316   < > 290 270   < > = values in this interval not displayed.    Basic Metabolic Panel:  Recent Labs  Lab 02/20/21 0546 02/21/21 0459  NA 139 139  K 3.9 4.2  CL 104 107  CO2 24 25  GLUCOSE 111* 105*  BUN 39* 39*  CREATININE 4.88* 5.27*  CALCIUM 8.9 8.9    Lipid Panel:  Recent Labs  Lab 02/18/21 1500 02/19/21 0600  CHOL 251*  --   TRIG 82 154*  HDL 104  --   CHOLHDL 2.4  --   VLDL 16  --   LDLCALC 131*  --     HgbA1c:  Recent Labs  Lab 02/18/21 1500  HGBA1C 6.8*    Urine Drug Screen:  Recent Labs  Lab 02/17/21 2115  LABOPIA NONE DETECTED  COCAINSCRNUR NONE DETECTED  LABBENZ NONE DETECTED  AMPHETMU NONE DETECTED  THCU NONE DETECTED  LABBARB NONE DETECTED     Alcohol Level  Recent Labs  Lab 02/17/21 2012  ETH <10     IMAGING past 24 hours No results found.  PHYSICAL  EXAM  Temp:  [97.8 F (36.6 C)-98.4 F (36.9 C)] 98 F (36.7 C) (01/04 0800) Pulse Rate:  [72-103] 103 (01/04 0932) Resp:  [11-31] 16 (01/04 0932) BP: (118-172)/(62-129) 146/85 (01/04 0932) SpO2:  [93 %-99 %] 95 % (01/04 0932)  General - Well nourished, well developed middle-aged African-American male, in no apparent distress. Ophthalmologic - fundi not visualized due to noncooperation. Cardiovascular - Regular rhythm and rate.  Mental Status -  Level of arousal and orientation to time, place, and person were intact. Language including expression, naming, repetition, comprehension was assessed and found intact. Recent and remote memory were intact. Fund of Knowledge was assessed and was intact.  Cranial Nerves II - XII - II - Visual field intact OU. III, IV, VI - Right eye is slightly esotropic, patient states this is baseline from a previous stroke V - Facial sensation intact bilaterally. VII -mild left facial droop VIII - Hearing & vestibular intact bilaterally. X - Palate elevates symmetrically.  XI - Chin turning & shoulder shrug intact bilaterally. XII - Tongue protrusion intact.  Motor Strength - The patients strength was normal in all extremities except for left UE drift.  Bulk was normal and fasciculations were absentMotor Tone - Muscle tone was assessed at the neck and appendages and was normal.  Diminished fine finger movements on the left and orbits right over left upper extremity.  Trace left grip weakness. Reflexes - The patients reflexes were symmetrical in all extremities and he had no pathological reflexes. Sensory - Light touch, temperature/pinprick were assessed and were symmetrical Coordination - Prominent dysmetria with left FNF, slight LLE ataxia with HTS Gait and Station - deferred.   ASSESSMENT/PLAN Carlos Rice is an 51 y.o. male with a PMH of uncontrolled HTN (noncompliant with medications), right toe amputation, prior stroke with right sided weakness  in 2016 who presents to the hospital via EMS as a Code Stroke after he was noted to acutely be weak on his left side following a fall out of his car. The patient states that while getting out of his car, he suddenly felt unsteady and then was witnessed to fall to the ground. He states that when he tried to get back up, he was too weak on his left side to do so. EMS was called and he was brought to the ED. He was severely hypertensive en route, with a BP of 250/146. CBG was 147. The patient states that when he was diagnosed with his first clinically apparent stroke in 2016, he was told that his MRI showed "7 strokes" including the new one that he presented with at that time.   Stroke: acute hemorrhage involving the right lentiform nucleus and external capsule likely secondary to uncontrolled hypertension Code Stroke -CT head reveals an acute hemorrhage involving the right lentiform nucleus and external capsule measuring 3.0 x 1.3 x 2.5 cm (estimated volume of 4.9 mL). There is minimal surrounding edema. Patchy hypodensities in the cerebral white matter bilaterally are nonspecific but compatible with moderate chronic small vessel ischemic disease MRI/ MRA- Unchanged 3 cm right basal ganglia hemorrhage.  Mild edema. Possible nearby punctate acute infarct in the right parietal operculum. Had a modera high dyspnea noted te chronic small vessel ischemic disease with multiple chronic lacunar infarcts. Intracranial atherosclerosis with moderate right paraclinoid ICA and right M2 stenoses. No large vessel occlusion. Patent cervical carotid and vertebral arteries without evidence of a significant stenosis other than at the right vertebral origin which is not well evaluated due to motion. 2D Echo pending LDL 131 mg percent  HgbA1c 6.8 VTE prophylaxis -SCDs No antithrombotic prior to admission, now on No antithrombotic. ICH Therapy recommendations: CIR  disposition:  pending  Hypertension Home meds:  None Seen in  May 2021 for uncontrolled HTN, was recommended to take Norvasc 10mg  Unstable BP goal systolic 010-272 Cleviprex IV PRN labetolol   Hyperlipidemia Home meds:  None, resumed in hospital LDL, goal < 70  Diabetes type II Controlled Home meds:  none HgbA1c Pending ., goal < 7.0 CBGs SSI  Other Stroke Risk Factors Hx stroke/TIA Patient states that he has had 7 strokes in the past, these were seen on MRI   Hospital day: 4   Patient seen and examined by NP/APP with MD. MD to update note as needed.   Janine Ores, DNP, FNP-BC [facility] Pager: [phone]  I have personally obtained history,examined this patient, reviewed notes, independently viewed imaging studies, participated in medical decision making and plan of care.ROS  completed by me personally and pertinent positives fully documented  I have made any additions or clarifications directly to the above note. Agree with note above.  Continue mobilization out of bed and ongoing physical occupational therapy and transfer to inpatient rehab after insurance approval and when bed available.  Long discussion with patient about stress relaxation techniques and answered questions.  Transfer to neurology floor bed later today.  Greater than 50% time during this 50-minute visit was spent in counseling and coordination of care about her stroke need for rehab and stress relaxation and answering question and discussion with care team    Antony Contras, MD Medical Director [place] Pager: [phone] 02/21/2021 3:46 PM   To contact Stroke Continuity provider, please refer to [email or web address] After hours, contact General Neurology

## 2021-02-21 NOTE — Progress Notes (Signed)
Physical Therapy Treatment Patient Details Name: Carlos Rice MRN: [id number] DOB: 04/18/1970 Today's Date: 02/21/2021   History of Present Illness 51 yo male admited code stroke with L side weakness and difficulty speaking. CT intraparenchymal hemorrhage of basal ganglia. 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 Pt continues to require modAx2 for safe ambulation due to severe co-ordination impairment, ataxia, impaired grip on walker, vearing to the L and significant forward lean. Pt required assist for donning shorts, socks, and shoes today. Pt unaware he put both LEs in one leg hole of shorts requiring verbal cues. Pt continued to defer going to CIR however s/p discussion with RN pt did agree to go to CIR as he is realizing "My speech is really bad.". Pt with report of having a son available to assist him during the day. Acute PT to cont to follow.    Recommendations for follow up therapy are one component of a multi-disciplinary discharge planning process, led by the attending physician.  Recommendations may be updated based on patient status, additional functional criteria and insurance authorization.  Follow Up Recommendations  Acute inpatient rehab (3hours/day)     Assistance Recommended at Discharge Frequent or constant Supervision/Assistance  Patient can return home with the following     Equipment Recommendations  Rolling walker (2 wheels)    Recommendations for Other Services Rehab consult     Precautions / Restrictions Precautions Precautions: Fall Restrictions Weight Bearing Restrictions: No     Mobility  Bed Mobility Overal bed mobility: Needs Assistance Bed Mobility: Supine to Sit Rolling: Supervision   Supine to sit: Min assist     General bed mobility comments: labored effort, trying  to roll self up to EOB, minA to complete trunk elevation, difficulty using L UE to assist due to fine motor impairement    Transfers Overall transfer level: Needs assistance Equipment used: Rolling walker (2 wheels) Transfers: Sit to/from Stand Sit to Stand: Min assist           General transfer comment: max directional verbal cues for safety and hand placement, pt steadies himself with back of legs on the bed, pt with wide base of support, continues to report the "me and walker don't get along" but continues to reach for one, pt very shaky, ataxic like    Ambulation/Gait Ambulation/Gait assistance: Mod assist;+2 safety/equipment;Max assist Gait Distance (Feet): 60 Feet Assistive device: Rolling walker (2 wheels);2 person hand held assist Gait Pattern/deviations: Step-through pattern;Decreased stride length;Decreased dorsiflexion - left;Trunk flexed;Wide base of support;Ataxic Gait velocity: dec Gait velocity interpretation: <1.8 ft/sec, indicate of risk for recurrent falls   General Gait Details: pt adamant about trying to amb on his own, PT and RN with close grip on gait belt as pt attempted to amb, pt with wide base of support with ataxia and tendency to fall forwards requiring modAx2 to prevent falling forward on to floor, s/p 5 steps pt unable to clear either foot, scuffing floor, L worse than R, pt then amb with RW in which pt kept vearing to the L requiring max verbal cues to correct despite running into the wall, pt stating "I don't feel anymore comfortable with this" as pt is significantly dependingon UEs and pushing RW to far forward requiring max directional verbal and tactile cues in addition to modAx2, pt unable to  consistantly clear feet > 5 steps   Stairs             Wheelchair Mobility    Modified Rankin (Stroke Patients Only) Modified Rankin (Stroke Patients Only) Pre-Morbid Rankin Score: Slight disability Modified Rankin: Moderately severe disability      Balance Overall balance assessment: Needs assistance Sitting-balance support: Bilateral upper extremity supported;Feet supported Sitting balance-Leahy Scale: Fair Sitting balance - Comments: pt easily distracted and unable to focus on donning sneaker, pt kept dropping sneaker with L hand due to inability to grasp, pt near fall x 2 off EOB when trying to don sneakers today utlimitely requiring assist to don both sneakers   Standing balance support: Bilateral upper extremity supported;Reliant on assistive device for balance Standing balance-Leahy Scale: Poor Standing balance comment: dependent on external support                            Cognition Arousal/Alertness: Awake/alert Behavior During Therapy: Impulsive Overall Cognitive Status: No family/caregiver present to determine baseline cognitive functioning                                 General Comments: pt continues to have decreased insight to deficits, insight to ability to care for self in safe manor and decresaed insight to ability to do work duties. Pt perseverating on how much he needs to go home due to "the financial peace, no one seems to care about". despite several near falls with PT pt continues to feel he can go home and rehab himself        Exercises      General Comments General comments (skin integrity, edema, etc.): RN gave pt BP meds, 145/68      Pertinent Vitals/Pain Pain Assessment: No/denies pain    Home Living                          Prior Function            PT Goals (current goals can now be found in the care plan section) Acute Rehab PT Goals Patient Stated Goal: home PT Goal Formulation: With patient Time For Goal Achievement: 03/05/21 Potential to Achieve Goals: Good Progress towards PT goals: Progressing toward goals    Frequency    Min 4X/week      PT Plan Current plan remains appropriate    Co-evaluation              AM-PAC PT "6  Clicks" Mobility   Outcome Measure  Help needed turning from your back to your side while in a flat bed without using bedrails?: A Little Help needed moving from lying on your back to sitting on the side of a flat bed without using bedrails?: A Little Help needed moving to and from a bed to a chair (including a wheelchair)?: A Lot Help needed standing up from a chair using your arms (e.g., wheelchair or bedside chair)?: A Lot Help needed to walk in hospital room?: A Lot Help needed climbing 3-5 steps with a railing? : Total 6 Click Score: 13    End of Session Equipment Utilized During Treatment: Gait belt Activity Tolerance: Patient tolerated treatment well Patient left: in chair;with call bell/phone within reach;with chair alarm set Nurse Communication: Mobility status PT Visit Diagnosis: Unsteadiness on feet (R26.81);Muscle weakness (generalized) (M62.81);Difficulty in walking, not elsewhere classified (  R26.2)     Time: 4715-9539 PT Time Calculation (min) (ACUTE ONLY): 35 min  Charges:  $Gait Training: 8-22 mins $Therapeutic Activity: 8-22 mins                     Kittie Plater, PT, DPT Acute Rehabilitation Services Pager #: [phone] Office #: [phone]    Berline Lopes 02/21/2021, 12:25 PM

## 2021-02-22 ENCOUNTER — Other Ambulatory Visit (HOSPITAL_COMMUNITY): Payer: Self-pay

## 2021-02-22 ENCOUNTER — Inpatient Hospital Stay (HOSPITAL_COMMUNITY): Payer: Medicare HMO

## 2021-02-22 NOTE — Care Management Important Message (Signed)
Important Message  Patient Details  Name: Carlos Rice MRN: [id number] Date of Birth: 1970/07/18   Medicare Important Message Given:  Yes     [name] 02/22/2021, 3:10 PM

## 2021-02-22 NOTE — Progress Notes (Signed)
Pt. sBP running in the 160s.  [name], MD made aware but instructed to not give IV hydralazine unless sBP >170

## 2021-02-22 NOTE — Progress Notes (Signed)
Echocardiogram attempted mulitple times and patient continues to refuse.  Patient states he doesn't want it because there is nothing wrong with his heart.

## 2021-02-22 NOTE — Progress Notes (Signed)
Inpatient Rehab Admissions Coordinator:   Awaiting determination from insurance regarding CIR auth request.    Shann Medal, PT, DPT Admissions Coordinator [phone] 02/22/21  10:02 AM

## 2021-02-22 NOTE — Progress Notes (Addendum)
STROKE TEAM PROGRESS NOTE   INTERVAL HISTORY BP better controlled with norvasc and hydralazine PRN. . Willing to go to rehab as long as he isn't here for weeks. Continue working with PT/OT.   Neurological exam is stable and unchanged. . Patient refused echo cardiogram today stating "there is nothing wrong with my heart". Will provide additional education  and information to patient.   Vitals:   02/22/21 0711 02/22/21 0754 02/22/21 0804 02/22/21 1147  BP:  (!) 171/79 (!) 166/88 (!) 161/88  Pulse:  80 79 83  Resp: 20 18 17 20   Temp:  98.5 F (36.9 C)  98.2 F (36.8 C)  TempSrc:  Oral  Oral  SpO2:  99%  100%  Weight:      Height:       CBC:  Recent Labs  Lab 02/17/21 2012 02/17/21 2020 02/20/21 0546 02/21/21 0459  WBC 6.2   < > 5.1 4.7  NEUTROABS 4.7  --   --   --   HGB 14.1   < > 12.9* 12.5*  HCT 42.0   < > 37.6* 36.8*  MCV 93.5   < > 92.2 93.2  PLT 316   < > 290 270   < > = values in this interval not displayed.    Basic Metabolic Panel:  Recent Labs  Lab 02/20/21 0546 02/21/21 0459  NA 139 139  K 3.9 4.2  CL 104 107  CO2 24 25  GLUCOSE 111* 105*  BUN 39* 39*  CREATININE 4.88* 5.27*  CALCIUM 8.9 8.9    Lipid Panel:  Recent Labs  Lab 02/18/21 1500 02/19/21 0600  CHOL 251*  --   TRIG 82 154*  HDL 104  --   CHOLHDL 2.4  --   VLDL 16  --   LDLCALC 131*  --     HgbA1c:  Recent Labs  Lab 02/18/21 1500  HGBA1C 6.8*    Urine Drug Screen:  Recent Labs  Lab 02/17/21 2115  LABOPIA NONE DETECTED  COCAINSCRNUR NONE DETECTED  LABBENZ NONE DETECTED  AMPHETMU NONE DETECTED  THCU NONE DETECTED  LABBARB NONE DETECTED     Alcohol Level  Recent Labs  Lab 02/17/21 2012  ETH <10     IMAGING past 24 hours No results found.  PHYSICAL EXAM  Temp:  [97.7 F (36.5 C)-98.5 F (36.9 C)] 98.2 F (36.8 C) (01/05 1147) Pulse Rate:  [79-98] 83 (01/05 1147) Resp:  [17-20] 20 (01/05 1147) BP: (147-175)/(79-97) 161/88 (01/05 1147) SpO2:  [96 %-100 %]  100 % (01/05 1147)  General - Well nourished, well developed middle-aged African-American male, in no apparent distress. Ophthalmologic - fundi not visualized due to noncooperation. Cardiovascular - Regular rhythm and rate.  Mental Status -  Level of arousal and orientation to time, place, and person were intact. Language including expression, naming, repetition, comprehension was assessed and found intact. Recent and remote memory were intact. Fund of Knowledge was assessed and was intact.  Cranial Nerves II - XII - II - Visual field intact OU. III, IV, VI - Right eye is slightly esotropic, patient states this is baseline from a previous stroke V - Facial sensation intact bilaterally. VII -mild left facial droop VIII - Hearing & vestibular intact bilaterally. X - Palate elevates symmetrically. XI - Chin turning & shoulder shrug intact bilaterally. XII - Tongue protrusion intact.  Motor Strength - The patients strength was normal in all extremities except for left UE drift.  Bulk was normal and fasciculations  were absentMotor Tone - Muscle tone was assessed at the neck and appendages and was normal.  Diminished fine finger movements on the left and orbits right over left upper extremity.  Trace left grip weakness. Reflexes - The patients reflexes were symmetrical in all extremities and he had no pathological reflexes. Sensory - Light touch, temperature/pinprick were assessed and were symmetrical Coordination - Prominent dysmetria with left FNF, slight LLE ataxia with HTS Gait and Station - deferred.   ASSESSMENT/PLAN Carlos Rice is an 51 y.o. male with a PMH of uncontrolled HTN (noncompliant with medications), right toe amputation, prior stroke with right sided weakness in 2016 who presents to the hospital via EMS as a Code Stroke after he was noted to acutely be weak on his left side following a fall out of his car. The patient states that while getting out of his car, he suddenly  felt unsteady and then was witnessed to fall to the ground. He states that when he tried to get back up, he was too weak on his left side to do so. EMS was called and he was brought to the ED. He was severely hypertensive en route, with a BP of 250/146. CBG was 147. The patient states that when he was diagnosed with his first clinically apparent stroke in 2016, he was told that his MRI showed "7 strokes" including the new one that he presented with at that time.   Stroke: acute hemorrhage involving the right lentiform nucleus and external capsule likely secondary to uncontrolled hypertension Code Stroke -CT head reveals an acute hemorrhage involving the right lentiform nucleus and external capsule measuring 3.0 x 1.3 x 2.5 cm (estimated volume of 4.9 mL). There is minimal surrounding edema. Patchy hypodensities in the cerebral white matter bilaterally are nonspecific but compatible with moderate chronic small vessel ischemic disease MRI/ MRA- Unchanged 3 cm right basal ganglia hemorrhage.  Mild edema. Possible nearby punctate acute infarct in the right parietal operculum. Had a modera high dyspnea noted te chronic small vessel ischemic disease with multiple chronic lacunar infarcts. Intracranial atherosclerosis with moderate right paraclinoid ICA and right M2 stenoses. No large vessel occlusion. Patent cervical carotid and vertebral arteries without evidence of a significant stenosis other than at the right vertebral origin which is not well evaluated due to motion. 2D Echo pending-patient refused LDL 131 mg percent  HgbA1c 6.8 VTE prophylaxis -SCDs No antithrombotic prior to admission, now on No antithrombotic. ICH Therapy recommendations: CIR  disposition:  pending  Hypertension Home meds:  None Seen in May 2021 for uncontrolled HTN, was recommended to take Norvasc 10mg  Unstable BP goal systolic 342-876 Cleviprex IV PRN labetolol   Hyperlipidemia Home meds:  None, resumed in hospital LDL,  goal < 70  Diabetes type II Controlled Home meds:  none HgbA1c Pending ., goal < 7.0 CBGs SSI  Other Stroke Risk Factors Hx stroke/TIA Patient states that he has had 7 strokes in the past, these were seen on MRI   Hospital day: 5   Patient seen and examined by NP/APP with MD. MD to update note as needed.   Janine Ores, DNP, FNP-BC [facility] Pager: [phone]  Continue ongoing physical occupational therapy and transfer to inpatient rehab when bed available.  Continue strict blood pressure control.  Patient counseled to be compliant with taking medicine for blood pressure as well as following with therapy.  Greater than 50% time during this 35-minute visit was spent in counseling and coordination of care and discussion about his  stroke, hypertension and therapy needs and answering questions.  Antony Contras, MD To contact Stroke Continuity provider, please refer to [email or web address] After hours, contact General Neurology

## 2021-02-22 NOTE — Progress Notes (Signed)
Speech Language Pathology Treatment: Cognitive-Linquistic  Patient Details Name: Carlos Rice MRN: [id number] DOB: 13-Oct-1970 Today's Date: 02/22/2021 Time: 8882-8003 SLP Time Calculation (min) (ACUTE ONLY): 14 min  Assessment / Plan / Recommendation Clinical Impression  Pt seen for treatment of mild dysarthria this am with focus on independence in use of basic strategies to increase intelligibility at the discourse level. Pt reports that his speech appears close to baseline, however he notices some dysarthric moments during informal conversation. Using slow rate, loud/increase of vocal intensity, over-articulation and pauses (SLOP), pt's speech was 100% intelligible during conversation and when reciting script for work related purposes. Intermittent fast rate of speech noted during these tasks and pt self-monitored/corrected in 80% of instances, requiring infrequent verbal cues. Pt would continue to benefit from SLP services during acute stay. Will f/u.   HPI HPI: 51 year old male presents as a code stroke.  Was in his car and felt off balance.  Utility worker saw him fall out of the car.  He felt acutely weak on his left side and was having difficulty speaking.  He states its like prior strokes and told EMS he has had 7 prior strokes.  CT head shows intraparenchymal hemorrhage of the basal ganglia.      SLP Plan  Continue with current plan of care      Recommendations for follow up therapy are one component of a multi-disciplinary discharge planning process, led by the attending physician.  Recommendations may be updated based on patient status, additional functional criteria and insurance authorization.    Recommendations                   Follow Up Recommendations: Acute inpatient rehab (3hours/day) Assistance recommended at discharge: PRN SLP Visit Diagnosis: Dysarthria and anarthria (R47.1) Plan: Continue with current plan of care          Ellwood Dense, [place],  [place] Office Number: [phone]  Acie Fredrickson  02/22/2021, 11:03 AM

## 2021-02-22 NOTE — Progress Notes (Signed)
Pt refused 2200 protonix & stool softener, states from this point on he will only take meds for his blood pressure. Educated pt. Notified Dr. Lorrin Goodell. Will pass along to day shift.

## 2021-02-22 NOTE — Progress Notes (Signed)
Physical Therapy Treatment Patient Details Name: Carlos Rice MRN: [id number] DOB: 1970-11-06 Today's Date: 02/22/2021   History of Present Illness 51 yo male admited code stroke with L side weakness and difficulty speaking. CT intraparenchymal hemorrhage of basal ganglia. 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  HTN DM R Toe amputation, nonadherence to medications    PT Comments    Patient progressing with ambulation safety and able to manage safely with +1 assist with walker.  Still overestimates his abilities and is significant fall risk, but seems eager for inpatient rehab.  Patient will continue to benefit from skilled PT in the acute setting and remains appropriate for acute inpatient rehab at d/c.    Recommendations for follow up therapy are one component of a multi-disciplinary discharge planning process, led by the attending physician.  Recommendations may be updated based on patient status, additional functional criteria and insurance authorization.  Follow Up Recommendations  Acute inpatient rehab (3hours/day)     Assistance Recommended at Discharge Frequent or constant Supervision/Assistance  Patient can return home with the following A little help with walking and/or transfers;A little help with bathing/dressing/bathroom;Help with stairs or ramp for entrance;Assistance with cooking/housework   Equipment Recommendations  Rolling walker (2 wheels)    Recommendations for Other Services       Precautions / Restrictions Precautions Precautions: Fall Precaution Comments: ataxia     Mobility  Bed Mobility Overal bed mobility: Needs Assistance Bed Mobility: Supine to Sit     Supine to sit: Min assist     General bed mobility comments: for balance    Transfers Overall transfer level: Needs assistance Equipment used: Rolling walker (2 wheels) Transfers: Sit to/from Stand Sit to Stand: Min guard           General transfer comment: cues for balance/ weight shift over  feet    Ambulation/Gait Ambulation/Gait assistance: Mod assist;+2 safety/equipment Gait Distance (Feet): 70 Feet (x 2) Assistive device: Rolling walker (2 wheels) Gait Pattern/deviations: Step-to pattern;Step-through pattern;Decreased stride length;Ataxic;Wide base of support;Shuffle;Decreased dorsiflexion - left       General Gait Details: ataxic pattern with only couple instances with head and shoulders flexed forward corrected with cues for tucking hips; no issues with using walker today, but shaky and needing assist for balance cues for foot position; standing break in hallway to touch ornaments on Christmas Tree high and low with mod support when standing without UE support on walker   Stairs             Wheelchair Mobility    Modified Rankin (Stroke Patients Only) Modified Rankin (Stroke Patients Only) Pre-Morbid Rankin Score: Slight disability Modified Rankin: Moderately severe disability     Balance Overall balance assessment: Needs assistance   Sitting balance-Leahy Scale: Good Sitting balance - Comments: leaning over to don shoes, able to tie laces today, reports was unable yesterday due to L hand weakness   Standing balance support: Reliant on assistive device for balance;Single extremity supported;No upper extremity supported Standing balance-Leahy Scale: Poor Standing balance comment: mod a with reaching when not holding walker                            Cognition Arousal/Alertness: Awake/alert Behavior During Therapy: Impulsive Overall Cognitive Status: No family/caregiver present to determine baseline cognitive functioning Area of Impairment: Safety/judgement;Attention                   Current Attention Level: Selective  Safety/Judgement: Decreased awareness of safety;Decreased awareness of deficits     General Comments: feels his balance is not that much different than prior to this stroke, but showed me a video of him  riding his recumbent bike with his dog on a leash and states no difficulty accessing the bike        Exercises Other Exercises Other Exercises: Asking for core strength exercises so demonstrated trunk rocking in chair with arms crossed over chest, scapular squeezes and hip flexion Other Exercises: Demonstrated coordination techniques sitting in chair feet on the floor to perform pattern alternately with feet forward, back and out, in    General Comments General comments (skin integrity, edema, etc.): Reports plans to go to rehab      Pertinent Vitals/Pain Pain Assessment: No/denies pain    Home Living                          Prior Function            PT Goals (current goals can now be found in the care plan section) Progress towards PT goals: Progressing toward goals    Frequency    Min 4X/week      PT Plan Current plan remains appropriate    Co-evaluation              AM-PAC PT "6 Clicks" Mobility   Outcome Measure    Help needed moving from lying on your back to sitting on the side of a flat bed without using bedrails?: A Little Help needed moving to and from a bed to a chair (including a wheelchair)?: A Lot Help needed standing up from a chair using your arms (e.g., wheelchair or bedside chair)?: A Lot Help needed to walk in hospital room?: A Lot Help needed climbing 3-5 steps with a railing? : Total 6 Click Score: 10    End of Session Equipment Utilized During Treatment: Gait belt Activity Tolerance: Patient tolerated treatment well Patient left: in chair;with call bell/phone within reach;with chair alarm set   PT Visit Diagnosis: Muscle weakness (generalized) (M62.81);Ataxic gait (R26.0);Other abnormalities of gait and mobility (R26.89)     Time: 1281-1886 PT Time Calculation (min) (ACUTE ONLY): 28 min  Charges:  $Gait Training: 8-22 mins $Therapeutic Activity: 8-22 mins                     Magda Kiel, PT Acute Rehabilitation  Services [phone] Office:[phone] 02/22/2021    Reginia Naas 02/22/2021, 5:12 PM

## 2021-02-23 ENCOUNTER — Other Ambulatory Visit: Payer: Self-pay

## 2021-02-23 ENCOUNTER — Encounter (HOSPITAL_COMMUNITY): Payer: Self-pay | Admitting: Neurology

## 2021-02-23 ENCOUNTER — Inpatient Hospital Stay (HOSPITAL_COMMUNITY)
Admission: RE | Admit: 2021-02-23 | Discharge: 2021-03-02 | DRG: 057 | Disposition: A | Discharge status: Home / Self Care | Payer: Medicare HMO | Source: Intra-hospital | Attending: Physical Medicine & Rehabilitation | Admitting: Physical Medicine & Rehabilitation

## 2021-02-23 ENCOUNTER — Encounter (HOSPITAL_COMMUNITY): Payer: Self-pay | Admitting: Physical Medicine and Rehabilitation

## 2021-02-23 DIAGNOSIS — I69398 Other sequelae of cerebral infarction: Secondary | ICD-10-CM

## 2021-02-23 DIAGNOSIS — E1122 Type 2 diabetes mellitus with diabetic chronic kidney disease: Secondary | ICD-10-CM | POA: Diagnosis present

## 2021-02-23 DIAGNOSIS — Z87891 Personal history of nicotine dependence: Secondary | ICD-10-CM | POA: Diagnosis not present

## 2021-02-23 DIAGNOSIS — I619 Nontraumatic intracerebral hemorrhage, unspecified: Secondary | ICD-10-CM | POA: Diagnosis present

## 2021-02-23 DIAGNOSIS — Z6826 Body mass index (BMI) 26.0-26.9, adult: Secondary | ICD-10-CM

## 2021-02-23 DIAGNOSIS — H5340 Unspecified visual field defects: Secondary | ICD-10-CM | POA: Diagnosis present

## 2021-02-23 DIAGNOSIS — I610 Nontraumatic intracerebral hemorrhage in hemisphere, subcortical: Secondary | ICD-10-CM

## 2021-02-23 DIAGNOSIS — Z89411 Acquired absence of right great toe: Secondary | ICD-10-CM

## 2021-02-23 DIAGNOSIS — I69193 Ataxia following nontraumatic intracerebral hemorrhage: Secondary | ICD-10-CM

## 2021-02-23 DIAGNOSIS — E785 Hyperlipidemia, unspecified: Secondary | ICD-10-CM | POA: Diagnosis present

## 2021-02-23 DIAGNOSIS — Z9114 Patient's other noncompliance with medication regimen: Secondary | ICD-10-CM | POA: Diagnosis not present

## 2021-02-23 DIAGNOSIS — I129 Hypertensive chronic kidney disease with stage 1 through stage 4 chronic kidney disease, or unspecified chronic kidney disease: Secondary | ICD-10-CM | POA: Diagnosis present

## 2021-02-23 DIAGNOSIS — D631 Anemia in chronic kidney disease: Secondary | ICD-10-CM | POA: Diagnosis present

## 2021-02-23 DIAGNOSIS — I613 Nontraumatic intracerebral hemorrhage in brain stem: Secondary | ICD-10-CM

## 2021-02-23 DIAGNOSIS — N184 Chronic kidney disease, stage 4 (severe): Secondary | ICD-10-CM | POA: Diagnosis present

## 2021-02-23 DIAGNOSIS — N189 Chronic kidney disease, unspecified: Secondary | ICD-10-CM

## 2021-02-23 DIAGNOSIS — Z89422 Acquired absence of other left toe(s): Secondary | ICD-10-CM

## 2021-02-23 DIAGNOSIS — E663 Overweight: Secondary | ICD-10-CM | POA: Diagnosis present

## 2021-02-23 DIAGNOSIS — I69152 Hemiplegia and hemiparesis following nontraumatic intracerebral hemorrhage affecting left dominant side: Principal | ICD-10-CM

## 2021-02-23 DIAGNOSIS — E119 Type 2 diabetes mellitus without complications: Secondary | ICD-10-CM

## 2021-02-23 MED ORDER — PROCHLORPERAZINE MALEATE 5 MG PO TABS: 5.0000 mg | ORAL_TABLET | Freq: Four times a day (QID) | ORAL | Status: DC | PRN

## 2021-02-23 MED ORDER — ALUM & MAG HYDROXIDE-SIMETH 200-200-20 MG/5ML PO SUSP: 30.0000 mL | ORAL | Status: DC | PRN

## 2021-02-23 MED ORDER — ACETAMINOPHEN 325 MG PO TABS: 325.0000 mg | ORAL_TABLET | ORAL | Status: DC | PRN

## 2021-02-23 MED ORDER — SENNOSIDES-DOCUSATE SODIUM 8.6-50 MG PO TABS: 1.0000 | ORAL_TABLET | Freq: Two times a day (BID) | ORAL | Status: DC

## 2021-02-23 MED ORDER — PROCHLORPERAZINE 25 MG RE SUPP: 12.5000 mg | Freq: Four times a day (QID) | RECTAL | Status: DC | PRN

## 2021-02-23 MED ORDER — MAGNESIUM GLUCONATE 500 MG PO TABS
250.0000 mg | ORAL_TABLET | Freq: Every day | ORAL | Status: DC
  Administered 2021-02-23 – 2021-02-25 (×3): 250 mg via ORAL
  Filled 2021-02-23 (×3): qty 1

## 2021-02-23 MED ORDER — POLYETHYLENE GLYCOL 3350 17 G PO PACK: 17.0000 g | PACK | Freq: Every day | ORAL | Status: DC | PRN

## 2021-02-23 MED ORDER — AMLODIPINE BESYLATE 10 MG PO TABS
10.0000 mg | ORAL_TABLET | Freq: Every day | ORAL | Status: DC
  Administered 2021-02-24 – 2021-03-02 (×7): 10 mg via ORAL
  Filled 2021-02-23 (×8): qty 1

## 2021-02-23 MED ORDER — TRAZODONE HCL 50 MG PO TABS: 25.0000 mg | ORAL_TABLET | Freq: Every evening | ORAL | Status: DC | PRN

## 2021-02-23 MED ORDER — PROCHLORPERAZINE EDISYLATE 10 MG/2ML IJ SOLN: 5.0000 mg | Freq: Four times a day (QID) | INTRAMUSCULAR | Status: DC | PRN

## 2021-02-23 MED ORDER — GUAIFENESIN-DM 100-10 MG/5ML PO SYRP: 5.0000 mL | ORAL_SOLUTION | Freq: Four times a day (QID) | ORAL | Status: DC | PRN

## 2021-02-23 MED ORDER — PANTOPRAZOLE SODIUM 40 MG PO TBEC: 40.0000 mg | DELAYED_RELEASE_TABLET | Freq: Every day | ORAL | Status: DC

## 2021-02-23 MED ORDER — FLEET ENEMA 7-19 GM/118ML RE ENEM: 1.0000 | ENEMA | Freq: Once | RECTAL | Status: DC | PRN

## 2021-02-23 MED ORDER — AMLODIPINE BESYLATE 10 MG PO TABS: 10.0000 mg | ORAL_TABLET | Freq: Every day | ORAL | Status: DC

## 2021-02-23 MED ORDER — LIVING WELL WITH DIABETES BOOK
Freq: Once | Status: AC
  Filled 2021-02-23: qty 1

## 2021-02-23 MED ORDER — BISACODYL 10 MG RE SUPP: 10.0000 mg | Freq: Every day | RECTAL | Status: DC | PRN

## 2021-02-23 MED ORDER — HYDRALAZINE HCL 10 MG PO TABS
10.0000 mg | ORAL_TABLET | Freq: Four times a day (QID) | ORAL | Status: DC | PRN
  Administered 2021-02-23 – 2021-02-28 (×8): 10 mg via ORAL
  Filled 2021-02-23 (×9): qty 1

## 2021-02-23 MED ORDER — DIPHENHYDRAMINE HCL 12.5 MG/5ML PO ELIX: 12.5000 mg | ORAL_SOLUTION | Freq: Four times a day (QID) | ORAL | Status: DC | PRN

## 2021-02-23 MED ORDER — BLOOD PRESSURE CONTROL BOOK
Freq: Once | Status: AC
  Filled 2021-02-23: qty 1

## 2021-02-23 NOTE — Progress Notes (Signed)
Occupational Therapy Treatment Patient Details Name: Carlos Rice MRN: [id number] DOB: 11-26-1970 Today's Date: 02/23/2021   History of present illness 51 yo male admited code stroke with L side weakness and difficulty speaking. CT intraparenchymal hemorrhage of basal ganglia. 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 Pt asking to walk in the hallway. Up to mod A needed for several, sudden LOB to left side. Pt with some decreased insight into deficits, impulsive. D/c recommendation to AIR remains very appropriate.    Recommendations for follow up therapy are one component of a multi-disciplinary discharge planning process, led by the attending physician.  Recommendations may be updated based on patient status, additional functional criteria and insurance authorization.    Follow Up Recommendations  Acute inpatient rehab (3hours/day)    Assistance Recommended at Discharge Set up Supervision/Assistance  Patient can return home with the following  A lot of help with walking and/or transfers;Assistance with cooking/housework;Direct supervision/assist for financial management;Assist for transportation;Direct supervision/assist for medications management   Equipment Recommendations  Other (comment) (TBD next venue)    Recommendations for Other Services      Precautions / Restrictions Precautions Precautions: Fall Precaution Comments: ataxia Restrictions Weight Bearing Restrictions: No       Mobility Bed Mobility               General bed mobility comments: not observed    Transfers Overall transfer level: Needs assistance Equipment used: Rolling walker (2 wheels) Transfers: Sit to/from Stand Sit to Stand: Min guard           General transfer comment: unsteadiness noted, min guard for safety     Balance Overall balance assessment: Needs assistance Sitting-balance  support: Feet supported Sitting balance-Leahy Scale: Good     Standing balance support: Reliant on assistive device for balance;Single extremity supported;No upper extremity supported Standing balance-Leahy Scale: Poor Standing balance comment: up to mod A with dynamic standing tasks                           ADL either performed or assessed with clinical judgement   ADL Overall ADL's : Needs assistance/impaired                                     Functional mobility during ADLs: Moderate assistance;Cueing for sequencing;Cueing for safety General ADL Comments: Pt eager and asking to walk in the hall. Mod A several times for sudden LOB to the left. utilized rw and chair follow    Extremity/Trunk Assessment Upper Extremity Assessment Upper Extremity Assessment: Generalized weakness;LUE deficits/detail (ataxic movement) LUE Deficits / Details: at least 3/5 LUE. Grip strength fatigued on rw with mobility   Lower Extremity Assessment Lower Extremity Assessment: Defer to PT evaluation        Vision       Perception     Praxis      Cognition Arousal/Alertness: Awake/alert Behavior During Therapy: Impulsive Overall Cognitive Status: No family/caregiver present to determine baseline cognitive functioning Area of Impairment: Safety/judgement;Attention                   Current Attention Level: Selective     Safety/Judgement: Decreased awareness of safety;Decreased awareness of deficits  Exercises     Shoulder Instructions       General Comments      Pertinent Vitals/ Pain       Pain Assessment: No/denies pain  Home Living                                          Prior Functioning/Environment              Frequency  Min 3X/week        Progress Toward Goals  OT Goals(current goals can now be found in the care plan section)  Progress towards OT goals: Progressing toward  goals  Acute Rehab OT Goals Patient Stated Goal: to go home and return to work OT Goal Formulation: With patient Time For Goal Achievement: 03/05/21 Potential to Achieve Goals: Good  Plan Discharge plan remains appropriate    Co-evaluation                 AM-PAC OT "6 Clicks" Daily Activity     Outcome Measure   Help from another person eating meals?: A Little Help from another person taking care of personal grooming?: A Little Help from another person toileting, which includes using toliet, bedpan, or urinal?: A Little Help from another person bathing (including washing, rinsing, drying)?: A Little Help from another person to put on and taking off regular upper body clothing?: A Little Help from another person to put on and taking off regular lower body clothing?: A Little 6 Click Score: 18    End of Session Equipment Utilized During Treatment: Rolling walker (2 wheels);Gait belt  OT Visit Diagnosis: Unsteadiness on feet (R26.81);Muscle weakness (generalized) (M62.81)   Activity Tolerance Patient tolerated treatment well   Patient Left in chair;with call bell/phone within reach;with chair alarm set   Nurse Communication Mobility status;Precautions        Time:  -1120    Charges: OT General Charges $OT Visit: 1 Visit OT Treatments $Self Care/Home Management : 8-22 mins  Tyrone Schimke, [place] Office: [phone]    Hortencia Pilar 02/23/2021, 2:11 PM

## 2021-02-23 NOTE — H&P (Shared)
Physical Medicine and Rehabilitation Admission H&P    Chief Complaint  Patient presents with   Functional deficits due to stroke    HPI: Carlos Rice is a 51 year old male with history of T2DM, CKD, CVA, HTN who was admitted on 02/17/21 with fall out of his car with acute onset of left sided weakness with inability to stand. BP 250/146 on enroute to ED. CT head done revealing acute hemorrhage in right lentiform nucleus and external capsule with minimal surrounding edema. [name] felt to be hypertensive in nature and he was started on clevidipine drip for BP management w/ SBP goal 1<160.  Patient with history of medication refusal and has been using herbals for BP management. He was educated on non-pharmacologic measures to mange stress in addition to BP med per discussion with Dr. Leonie Rice. 2D echo not done as patient continues to decline this. BP improving with amlodipine on board. Therapy ongoing and patient limited by balance deficits with ataxia and generalized weakness. CIR recommended due to functional decline.    Review of Systems  Constitutional:  Negative for chills and fever.  HENT:  Negative for hearing loss and tinnitus.   Eyes:  Negative for blurred vision and double vision.  Respiratory:  Negative for cough.   Cardiovascular:  Negative for chest pain.  Gastrointestinal:  Negative for constipation and heartburn.  Genitourinary:  Negative for dysuria.  Musculoskeletal:  Negative for myalgias.  Neurological:  Positive for focal weakness and weakness (chronic right sidded weakness and now with left sided weakness.). Negative for dizziness and headaches.  Psychiatric/Behavioral:  The patient is not nervous/anxious.     Past Medical History:  Diagnosis Date   CKD (chronic kidney disease) stage 4, GFR 15-29 ml/min (HCC)    CVA (cerebral vascular accident) ([place])    High blood pressure    History of noncompliance with medical treatment    T2DM (type 2 diabetes mellitus) ([place])      Past Surgical History:  Procedure Laterality Date   AMPUTATION TOE Right    great toe   AMPUTATION TOE Left    5th toe   DG GREAT TOE RIGHT FOOT     great toe     ROTATOR CUFF REPAIR Right 2018    Family History  Problem Relation Age of Onset   Diabetes Mother    Stroke Mother    Healthy Sister    Mental retardation Brother    Healthy Brother     Social History:  reports that he has quit smoking. His smoking use included cigarettes. He has been exposed to tobacco smoke. He has never used smokeless tobacco. He reports that he does not drink alcohol and does not use drugs.   Allergies: No Known Allergies   Medications Prior to Admission  Medication Sig Dispense Refill   [START ON 02/24/2021] amLODipine (NORVASC) 10 MG tablet Take 1 tablet (10 mg total) by mouth daily.     pantoprazole (PROTONIX) 40 MG tablet Take 1 tablet (40 mg total) by mouth at bedtime.     senna-docusate (SENOKOT-S) 8.6-50 MG tablet Take 1 tablet by mouth 2 (two) times daily.      Drug Regimen Review { DRUG REGIMEN [id number]}  Home: Home Living Family/patient expects to be discharged to:: Private residence Living Arrangements: Children (son 61 yo Carlos Rice) Available Help at Discharge: Family, Available PRN/intermittently Type of Home: Apartment Home Access: Level entry Home Layout: One level Bathroom Shower/Tub: Chiropodist: Standard Home Equipment: Grab bars -  tub/shower   Functional History: Prior Function Prior Level of Function : Independent/Modified Independent, Working/employed, Driving Mobility Comments: no AD, drives, does grocery shopping ADLs Comments: indep  Functional Status:  Mobility: Bed Mobility Overal bed mobility: Needs Assistance Bed Mobility: Supine to Sit Rolling: Supervision Supine to sit: Min assist General bed mobility comments: not observed Transfers Overall transfer level: Needs assistance Equipment used: Rolling walker (2  wheels) Transfers: Sit to/from Stand Sit to Stand: Min guard General transfer comment: unsteadiness noted, min guard for safety Ambulation/Gait Ambulation/Gait assistance: Mod assist, +2 safety/equipment Gait Distance (Feet): 70 Feet (x 2) Assistive device: Rolling walker (2 wheels) Gait Pattern/deviations: Step-to pattern, Step-through pattern, Decreased stride length, Ataxic, Wide base of support, Shuffle, Decreased dorsiflexion - left General Gait Details: ataxic pattern with only couple instances with head and shoulders flexed forward corrected with cues for tucking hips; no issues with using walker today, but shaky and needing assist for balance cues for foot position; standing break in hallway to touch ornaments on Christmas Tree high and low with mod support when standing without UE support on walker Gait velocity: dec Gait velocity interpretation: <1.8 ft/sec, indicate of risk for recurrent falls    ADL: ADL Overall ADL's : Needs assistance/impaired Eating/Feeding: Set up Eating/Feeding Details (indicate cue type and reason): drinking from cup and noted to choke on water. pt unable to speak after and states when recovered "i have never coughed as much as i have in my life since ive been here" pt reports "its not the water its my coughing" Grooming: Wash/dry face, Oral care, Minimal assistance, Standing Lower Body Dressing: Minimal assistance, Sit to/from stand Lower Body Dressing Details (indicate cue type and reason): pt attempting to don pants supine with R UE partially pinned due to lines but declining help. OT had to visually show patient the reason he was not able to reach further. OT had to reinforce letting pt attempt task but needing to help for a moment. pt failed don supine and OT suggested eob. pt unable to loop pants over foot initially and after multiple failed attempts able to complete. pt needs (A) for sit<>stand balance. Pt very guards to (A) and requires verbal  reassurance that he can maximize his attempt Toilet Transfer: Moderate assistance, Ambulation, Regular Toilet Functional mobility during ADLs: Moderate assistance, Cueing for sequencing, Cueing for safety General ADL Comments: Pt eager and asking to walk in the hall. Mod A several times for sudden LOB to the left. utilized rw and chair follow  Cognition: Cognition Overall Cognitive Status: No family/caregiver present to determine baseline cognitive functioning Orientation Level: Oriented X4 Cognition Arousal/Alertness: Awake/alert Behavior During Therapy: Impulsive Overall Cognitive Status: No family/caregiver present to determine baseline cognitive functioning Area of Impairment: Safety/judgement, Attention Orientation Level: Time (reports 1/3. reports he has been here for 3 days.) Current Attention Level: Selective Safety/Judgement: Decreased awareness of safety, Decreased awareness of deficits General Comments: feels his balance is not that much different than prior to this stroke, but showed me a video of him riding his recumbent bike with his dog on a leash and states no difficulty accessing the bike   Blood pressure (!) 160/94, pulse 88, temperature 98.2 F (36.8 C), temperature source Oral, resp. rate 18, height 5\' 8"  (1.727 m), weight 85.4 kg, SpO2 100 %. Physical Exam  No results found for this or any previous visit (from the past 48 hour(s)). No results found.     Medical Problem List and Plan: 1. Functional deficits secondary to ***  -  patient may *** shower  -ELOS/Goals: *** 2.  Antithrombotics: -DVT/anticoagulation:  Mechanical: Sequential compression devices, below knee Bilateral lower extremities  -antiplatelet therapy: N/A due to [place] 3. Pain Management: tylenol prn.  4. Mood: LCSW to follow for evaluation and support. Team to provide ego support.   -antipsychotic agents: N/A 5. Neuropsych: This patient is capable of making decisions on his own behalf. 6.  Skin/Wound Care: Routine pressure relief measures.  7. Fluids/Electrolytes/Nutrition: Monitor I/O. Check BMET in am 8. HTN: Monitor BP TID--continue Norvasc 9. T2DM: Hgb A1C-6.8. CM diet with diet education by RD. --fasting BS 105-111 range. Will monitor BS ac/hs  10. Stage IV CKD: BUN/SCr 47/4.58 at admission.  36. H/o stroke: Has declined statin in the past.  12. Anemia of chronic disease?: Will recheck CBC in am.  73. Hyperlipidemia: Chol-251, LDL-131 and Trig-154.   --has refused statin in the past.  14. Medication non-compliance: Will limit medications to essential only.      ***  Bary Leriche, PA-C 02/23/2021

## 2021-02-23 NOTE — Discharge Summary (Addendum)
Name: Carlos Rice MRN: [id number] DOB: 17-Jan-1971 51 y.o. PCP: Pcp, No  Date of Admission: 02/17/2021  7:34 PM Date of Discharge: 02/23/2021  1:34 PM Attending Physician: No att. providers found  Discharge Diagnosis: 1.  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 2.  Uncontrolled hypertension 3. Dyslipidemia 4.  Left hemiparesis  Discharge Medications: Allergies as of 02/23/2021   No Known Allergies      Medication List     TAKE these medications    amLODipine 10 MG tablet Commonly known as: NORVASC Take 1 tablet (10 mg total) by mouth daily. Start taking on: February 24, 2021   pantoprazole 40 MG tablet Commonly known as: PROTONIX Take 1 tablet (40 mg total) by mouth at bedtime.   senna-docusate 8.6-50 MG tablet Commonly known as: Senokot-S Take 1 tablet by mouth 2 (two) times daily.               Discharge Care Instructions  (From admission, onward)           Start     Ordered   02/23/21 0000  No dressing needed        02/23/21 1059            Disposition and follow-up:   [name] was discharged from [facility] in [place] condition.  At the hospital follow up visit please address:  1.   Stroke: please assess patient's recovery since this past stroke  HTN: please assess patient's blood pressure and compliance with blood pressure medications  Dyslipidemia: please assess patient's LDL cholesterol and willingness to start statin to control cholesterol  2.  Labs / imaging needed at time of follow-up: none  3.  Pending labs/ test needing follow-up: none  Follow-up Appointments:  Follow-up Information     [facility] Follow up.   Specialty: Neurology Contact information: [address] [place] Hospital Course by problem list: 1. Stroke: Code stroke CT showed acute  hemorrhage involving the right lentiform nucleus and external capsule measuring 3.0 x 1.3 x 2.5 cm (estimated volume of 4.9 mL). There is minimal surrounding edema. Patchy hypodensities in the cerebral white matter bilaterally are nonspecific but compatible with moderate chronic small vessel ischemic disease. MRI showed Unchanged 3 cm right basal ganglia hemorrhage.  Mild edema. Possible nearby punctate acute infarct in the right parietal operculum. Patient refused 2D echo stating there was nothing wrong with his heart.   2. Dyslipidemia: LDL cholesterol 131. Patient refusing statin at this time.   3. Hypertension: Patient was given IV Labetalol followed by clevidipine infusion to manage patient's blood pressure. Patient required multiple PRN IV hydralazine as well due to refractory hypertension. Patient was placed on Norvasc 10 mg to manage patient's uncontrolled hypertension.    Discharge Exam:   BP (!) 160/94 (BP Location: Left Arm)    Pulse 88    Temp 98.2 F (36.8 C) (Oral)    Resp 18    Ht 5\' 8"  (1.727 m)    Wt 85.4 kg    SpO2 100%    BMI 28.63 kg/m  Discharge exam:  General - Well nourished, well developed middle-aged African-American male, in no apparent distress. Ophthalmologic - fundi not visualized due to noncooperation. Cardiovascular - Regular rhythm and rate.   Mental Status -  Level of arousal and orientation to time, place,  and person were intact. Language including expression, naming, repetition, comprehension was assessed and found intact. Recent and remote memory were intact. Fund of Knowledge was assessed and was intact.   Cranial Nerves II - XII - II - Visual field intact OU. III, IV, VI - Right eye is slightly esotropic, patient states this is baseline from a previous stroke V - Facial sensation intact bilaterally. VII -mild left facial droop VIII - Hearing & vestibular intact bilaterally. X - Palate elevates symmetrically. XI - Chin turning & shoulder shrug intact  bilaterally. XII - Tongue protrusion intact.   Motor Strength - The patients strength was normal in all extremities except for left UE drift.  Bulk was normal and fasciculations were absentMotor Tone - Muscle tone was assessed at the neck and appendages and was normal.  Diminished fine finger movements on the left and orbits right over left upper extremity.  Trace left grip weakness. Reflexes - The patients reflexes were symmetrical in all extremities and he had no pathological reflexes. Sensory - Light touch, temperature/pinprick were assessed and were symmetrical Coordination - Prominent dysmetria with left FNF, slight LLE ataxia with HTS Gait and Station - deferred.   Pertinent Labs, Studies, and Procedures:  IMAGING past 24 hours MR ANGIO HEAD WO CONTRAST   Result Date: 02/18/2021 CLINICAL DATA:  Neuro deficit, acute, stroke suspected. Acute left-sided weakness. Right basal ganglia hemorrhage on CT. EXAM: MRI HEAD WITHOUT CONTRAST MRA HEAD WITHOUT CONTRAST MRA NECK WITHOUT CONTRAST TECHNIQUE: Multiplanar, multiecho pulse sequences of the brain and surrounding structures were obtained without intravenous contrast. Angiographic images of the Circle of Willis were obtained using MRA technique without intravenous contrast. Angiographic images of the neck were obtained using MRA technique without intravenous contrast. Carotid stenosis measurements (when applicable) are obtained utilizing NASCET criteria, using the distal internal carotid diameter as the denominator. COMPARISON:  Head CT 02/17/2021 FINDINGS: MRI HEAD FINDINGS Brain: A 3.0 x 1.5 cm hemorrhage involving the right lentiform nucleus and external capsule is similar in size to the recent CT. There is mild surrounding edema with very mild local mass effect including slight mass effect on the right lateral ventricle but no midline shift. A separate 2 mm focus of restricted diffusion posterolateral to the hemorrhage in the right parietal  operculum may represent a punctate acute infarct. Patchy T2 hyperintensities in the cerebral white matter bilaterally and in the pons are nonspecific but compatible with moderate chronic small vessel ischemic disease. There are chronic lacunar infarcts in the basal ganglia, left thalamus, and pons. No extra-axial fluid collection or hydrocephalus is evident. Chronic microhemorrhages are noted in the pons and right occipital lobe. Vascular: Major intracranial vascular flow voids are preserved. Skull and upper cervical spine: Unremarkable bone marrow signal. Sinuses/Orbits: Unremarkable orbits. Posterior left ethmoid air cell opacification. No significant mastoid fluid. Other: None. MRA HEAD FINDINGS The included intracranial portions of the vertebral arteries are widely patent to the basilar with the left being dominant. A patent left PICA is partially visualized although its origin was not imaged. A right PICA is not identified. Patent AICA and SCA origins are seen bilaterally. The basilar artery is widely patent. Posterior communicating arteries are diminutive or absent. Both PCAs are patent without evidence of significant proximal stenosis. The internal carotid arteries are patent from skull base to carotid termini with a moderate paraclinoid stenosis noted on the right. ACAs and MCAs are patent with mild branch vessel irregularity but without evidence of a proximal branch occlusion or significant A1 or  M1 stenosis. The right A1 segment is absent. There is a moderate stenosis of the right M2 superior division proximally. No aneurysm is identified. MRA NECK FINDINGS Assessment is limited by noncontrast technique and mild motion artifact. The aortic arch and proximal arch vessels were not imaged. The included portions of the common carotid and cervical internal carotid arteries are patent without evidence of a significant stenosis. The vertebral arteries are patent with antegrade flow bilaterally and with the left  vertebral artery being mildly to moderately dominant. Motion artifact limits assessment of the right vertebral artery origin, and a stenosis is not excluded. There is no evidence of significant stenosis or dissection elsewhere involving either vertebral artery in the neck. IMPRESSION: 1. Unchanged 3 cm right basal ganglia hemorrhage.  Mild edema. 2. Possible nearby punctate acute infarct in the right parietal operculum. 3. Moderate chronic small vessel ischemic disease with multiple chronic lacunar infarcts. 4. Intracranial atherosclerosis with moderate right paraclinoid ICA and right M2 stenoses. No large vessel occlusion. 5. Patent cervical carotid and vertebral arteries without evidence of a significant stenosis other than at the right vertebral origin which is not well evaluated due to motion. Electronically Signed   By: Logan Bores M.D.   On: 02/18/2021 14:39    MR ANGIO NECK WO CONTRAST   Result Date: 02/18/2021 CLINICAL DATA:  Neuro deficit, acute, stroke suspected. Acute left-sided weakness. Right basal ganglia hemorrhage on CT. EXAM: MRI HEAD WITHOUT CONTRAST MRA HEAD WITHOUT CONTRAST MRA NECK WITHOUT CONTRAST TECHNIQUE: Multiplanar, multiecho pulse sequences of the brain and surrounding structures were obtained without intravenous contrast. Angiographic images of the Circle of Willis were obtained using MRA technique without intravenous contrast. Angiographic images of the neck were obtained using MRA technique without intravenous contrast. Carotid stenosis measurements (when applicable) are obtained utilizing NASCET criteria, using the distal internal carotid diameter as the denominator. COMPARISON:  Head CT 02/17/2021 FINDINGS: MRI HEAD FINDINGS Brain: A 3.0 x 1.5 cm hemorrhage involving the right lentiform nucleus and external capsule is similar in size to the recent CT. There is mild surrounding edema with very mild local mass effect including slight mass effect on the right lateral ventricle but  no midline shift. A separate 2 mm focus of restricted diffusion posterolateral to the hemorrhage in the right parietal operculum may represent a punctate acute infarct. Patchy T2 hyperintensities in the cerebral white matter bilaterally and in the pons are nonspecific but compatible with moderate chronic small vessel ischemic disease. There are chronic lacunar infarcts in the basal ganglia, left thalamus, and pons. No extra-axial fluid collection or hydrocephalus is evident. Chronic microhemorrhages are noted in the pons and right occipital lobe. Vascular: Major intracranial vascular flow voids are preserved. Skull and upper cervical spine: Unremarkable bone marrow signal. Sinuses/Orbits: Unremarkable orbits. Posterior left ethmoid air cell opacification. No significant mastoid fluid. Other: None. MRA HEAD FINDINGS The included intracranial portions of the vertebral arteries are widely patent to the basilar with the left being dominant. A patent left PICA is partially visualized although its origin was not imaged. A right PICA is not identified. Patent AICA and SCA origins are seen bilaterally. The basilar artery is widely patent. Posterior communicating arteries are diminutive or absent. Both PCAs are patent without evidence of significant proximal stenosis. The internal carotid arteries are patent from skull base to carotid termini with a moderate paraclinoid stenosis noted on the right. ACAs and MCAs are patent with mild branch vessel irregularity but without evidence of a proximal branch occlusion or  significant A1 or M1 stenosis. The right A1 segment is absent. There is a moderate stenosis of the right M2 superior division proximally. No aneurysm is identified. MRA NECK FINDINGS Assessment is limited by noncontrast technique and mild motion artifact. The aortic arch and proximal arch vessels were not imaged. The included portions of the common carotid and cervical internal carotid arteries are patent without  evidence of a significant stenosis. The vertebral arteries are patent with antegrade flow bilaterally and with the left vertebral artery being mildly to moderately dominant. Motion artifact limits assessment of the right vertebral artery origin, and a stenosis is not excluded. There is no evidence of significant stenosis or dissection elsewhere involving either vertebral artery in the neck. IMPRESSION: 1. Unchanged 3 cm right basal ganglia hemorrhage.  Mild edema. 2. Possible nearby punctate acute infarct in the right parietal operculum. 3. Moderate chronic small vessel ischemic disease with multiple chronic lacunar infarcts. 4. Intracranial atherosclerosis with moderate right paraclinoid ICA and right M2 stenoses. No large vessel occlusion. 5. Patent cervical carotid and vertebral arteries without evidence of a significant stenosis other than at the right vertebral origin which is not well evaluated due to motion. Electronically Signed   By: Logan Bores M.D.   On: 02/18/2021 14:39    MR BRAIN WO CONTRAST   Result Date: 02/18/2021 CLINICAL DATA:  Neuro deficit, acute, stroke suspected. Acute left-sided weakness. Right basal ganglia hemorrhage on CT. EXAM: MRI HEAD WITHOUT CONTRAST MRA HEAD WITHOUT CONTRAST MRA NECK WITHOUT CONTRAST TECHNIQUE: Multiplanar, multiecho pulse sequences of the brain and surrounding structures were obtained without intravenous contrast. Angiographic images of the Circle of Willis were obtained using MRA technique without intravenous contrast. Angiographic images of the neck were obtained using MRA technique without intravenous contrast. Carotid stenosis measurements (when applicable) are obtained utilizing NASCET criteria, using the distal internal carotid diameter as the denominator. COMPARISON:  Head CT 02/17/2021 FINDINGS: MRI HEAD FINDINGS Brain: A 3.0 x 1.5 cm hemorrhage involving the right lentiform nucleus and external capsule is similar in size to the recent CT. There is mild  surrounding edema with very mild local mass effect including slight mass effect on the right lateral ventricle but no midline shift. A separate 2 mm focus of restricted diffusion posterolateral to the hemorrhage in the right parietal operculum may represent a punctate acute infarct. Patchy T2 hyperintensities in the cerebral white matter bilaterally and in the pons are nonspecific but compatible with moderate chronic small vessel ischemic disease. There are chronic lacunar infarcts in the basal ganglia, left thalamus, and pons. No extra-axial fluid collection or hydrocephalus is evident. Chronic microhemorrhages are noted in the pons and right occipital lobe. Vascular: Major intracranial vascular flow voids are preserved. Skull and upper cervical spine: Unremarkable bone marrow signal. Sinuses/Orbits: Unremarkable orbits. Posterior left ethmoid air cell opacification. No significant mastoid fluid. Other: None. MRA HEAD FINDINGS The included intracranial portions of the vertebral arteries are widely patent to the basilar with the left being dominant. A patent left PICA is partially visualized although its origin was not imaged. A right PICA is not identified. Patent AICA and SCA origins are seen bilaterally. The basilar artery is widely patent. Posterior communicating arteries are diminutive or absent. Both PCAs are patent without evidence of significant proximal stenosis. The internal carotid arteries are patent from skull base to carotid termini with a moderate paraclinoid stenosis noted on the right. ACAs and MCAs are patent with mild branch vessel irregularity but without evidence of a proximal branch  occlusion or significant A1 or M1 stenosis. The right A1 segment is absent. There is a moderate stenosis of the right M2 superior division proximally. No aneurysm is identified. MRA NECK FINDINGS Assessment is limited by noncontrast technique and mild motion artifact. The aortic arch and proximal arch vessels were  not imaged. The included portions of the common carotid and cervical internal carotid arteries are patent without evidence of a significant stenosis. The vertebral arteries are patent with antegrade flow bilaterally and with the left vertebral artery being mildly to moderately dominant. Motion artifact limits assessment of the right vertebral artery origin, and a stenosis is not excluded. There is no evidence of significant stenosis or dissection elsewhere involving either vertebral artery in the neck. IMPRESSION: 1. Unchanged 3 cm right basal ganglia hemorrhage.  Mild edema. 2. Possible nearby punctate acute infarct in the right parietal operculum. 3. Moderate chronic small vessel ischemic disease with multiple chronic lacunar infarcts. 4. Intracranial atherosclerosis with moderate right paraclinoid ICA and right M2 stenoses. No large vessel occlusion. 5. Patent cervical carotid and vertebral arteries without evidence of a significant stenosis other than at the right vertebral origin which is not well evaluated due to motion. Electronically Signed   By: Logan Bores M.D.   On: 02/18/2021 14:39    CT HEAD CODE STROKE WO CONTRAST   Result Date: 02/17/2021 CLINICAL DATA:  Code stroke. Slurred speech and left-sided weakness. EXAM: CT HEAD WITHOUT CONTRAST TECHNIQUE: Contiguous axial images were obtained from the base of the skull through the vertex without intravenous contrast. COMPARISON:  None. FINDINGS: The study is mildly motion degraded. Brain: An acute hemorrhage involving the right lentiform nucleus and external capsule measures 3.0 x 1.3 x 2.5 cm (estimated volume of 4.9 mL). There is minimal surrounding edema. No midline shift, acute cortically based infarct, or extra-axial fluid collection is identified. Patchy hypodensities in the cerebral white matter bilaterally are nonspecific but compatible with moderate chronic small vessel ischemic disease. The ventricles are normal in size. Vascular: Calcified  atherosclerosis at the skull base. No hyperdense vessel. Skull: No fracture or suspicious osseous lesion. Sinuses/Orbits: Posterior left ethmoid air cell opacification. Clear mastoid air cells. Unremarkable orbits. Other: None. ASPECTS [facility] Early CT Score) Not scored due to the presence of acute hemorrhage. IMPRESSION: 1. Acute right basal ganglia hemorrhage. 2. Moderate chronic small vessel ischemic disease. These results were communicated to Dr. Cheral Marker at 8:00 pm on 02/17/2021 by text page via the [facility] messaging system. Electronically Signed   By: Logan Bores M.D.   On: 02/17/2021 20:01    Discharge Instructions: Discharge Instructions     Diet - low sodium heart healthy   Complete by: As directed    Discharge instructions   Complete by: As directed    Dear Mr. Carlos Rice,  It was a pleasure taking care of you while you were in the hospital. You were admitted due to having a stroke. Please take your medications as prescribed. Please follow up with Dr. [name] at [facility]. You will be discharged to inpatient rehab to recover your strength.  Take care! [place] Team   Increase activity slowly   Complete by: As directed    No dressing needed   Complete by: As directed        Signed: France Ravens, MD PGY1 Resident 02/23/2021, 4:04 PM    I have personally obtained history,examined this patient, reviewed notes, independently viewed imaging studies, participated in medical decision making and plan of  care.ROS completed by me personally and pertinent positives fully documented  I have made any additions or clarifications directly to the above note. Agree with note above.  35 minutes was spent in doing and reviewing this discharge summary.  Antony Contras, MD Medical Director [facility] Pager: [phone] 02/23/2021 5:18 PM

## 2021-02-23 NOTE — Progress Notes (Signed)
Inpatient Rehab Admissions Coordinator:    I have insurance approval and a bed available for pt to admit to CIR today. Dr. [name] in agreement.  Will let pt/family and TOC team know.   Shann Medal, PT, DPT Admissions Coordinator [phone] 02/23/21  11:45 AM

## 2021-02-23 NOTE — Progress Notes (Signed)
PMR Admission Coordinator Pre-Admission Assessment ° °Patient: Carlos Rice is an 51 y.o., male °MRN: [id number] °DOB: 03/12/1970 °Height: 5' 8" (172.7 cm) °Weight: 85.4 kg ° °Insurance Information °HMO:     PPO:      PCP:      IPA:      80/20:      OTHER:  °PRIMARY: [employer]      Policy#: [id number]    Subscriber: pt °CM Name: Casey Copeland      Phone#: [phone] ext [phone]     Fax#: [phone] °Pre-Cert#: [id number] auth for CIR from Casey at [employer] with updates due to Pat E at fax listed above on 1/11 (ext [phone])      Employer:  °Benefits:  Phone #: [phone]     Name:  °Eff. Date: 02/19/19     Deduct: $0      Out of Pocket Max: $3400 ($0 met)      Life Max: n/a °CIR: $150/day for days 1-9 ($1200 total)      SNF: 20 full days °Outpatient:      Co-Pay: $15-$30/day °Home Health: 100%      Co-Pay:  °DME: 80%     Co-Ins: 20% °Providers:  °SECONDARY:       Policy#:      Phone#:  ° °Financial Counselor:       Phone#:  ° °The “Data Collection Information Summary” for patients in Inpatient Rehabilitation Facilities with attached “Privacy Act Statement-Health Care Records” was provided and verbally reviewed with: Patient and Family ° °Emergency Contact Information °Contact Information   ° ° Name Relation Home Work Mobile  ° Ruminski,Bobby Mother   [phone]  ° Gohman,Azariah Father   [phone]  ° °  ° ° °Current Medical History  °Patient Admitting Diagnosis: CVA  ° °History of Present Illness: Pt is a 51 y/o male with PMH of uncontrolled HTN (noncompliant with medications), right toe amputation, prior stroke with R sided weakness in 2016, who presents to [facility] on 02/17/21 via EMS as a Code Stroke after he was noted to be acutely weak on his left side following a fall out of his car.  In ED pt severely hypertensive with BP 250/146, CBG 147.  CT showed acute hemorrhage involving R lentiform nucleus and external capsule measuring 3.0x1.3x2.5 cm (estimated volume of 4.9 mL).  There is  minimal surrounding edema.  Patchy hypodensities in the cerebral white matter bilaterally are nonspecific but compatible with moderate chronic small vessel ischemic disease.  MRI/MRA showed 3 cm right basal ganglia hemorrhage with mild edema and possible nearby acute infarct in R parietal operculum.  Moderate stenosis in paraclinoid ICA and right M2 stenoses but no large vessel occlusion.  Pt refused 2D Echo stating there was nothing wrong with his heart.  HgA1C 6.8.  Pt initially required cleviprex for BP control.  Therapy ongoing and pt demos deficits in awareness and functional mobility.  Recommendations are for CIR.  ° °Complete NIHSS TOTAL: 2 ° °Patient's medical record from [facility] has been reviewed by the rehabilitation admission coordinator and physician. ° °Past Medical History  °No past medical history on file. ° °Has the patient had major surgery during 100 days prior to admission? No ° °Family History   °family history is not on file. ° °Current Medications ° °Current Facility-Administered Medications:  °  acetaminophen (TYLENOL) tablet 650 mg, 650 mg, Oral, Q4H PRN **OR** acetaminophen (TYLENOL) 160 MG/5ML solution 650 mg, 650 mg, Per Tube, Q4H PRN **OR** acetaminophen (TYLENOL)   suppository 650 mg, 650 mg, Rectal, Q4H PRN, Lindzen, Eric, MD °  amLODipine (NORVASC) tablet 10 mg, 10 mg, Oral, Daily, Sethi, Pramod S, MD, 10 mg at 02/23/21 0849 °  Chlorhexidine Gluconate Cloth 2 % PADS 6 each, 6 each, Topical, Daily, Palikh, Gaurang M, MD, 6 each at 02/23/21 0849 °  hydrALAZINE (APRESOLINE) injection 20 mg, 20 mg, Intravenous, Q6H PRN, Sethi, Pramod S, MD, 20 mg at 02/21/21 1705 °  labetalol (NORMODYNE) injection 20 mg, 20 mg, Intravenous, Q2H PRN, Sethi, Pramod S, MD, 20 mg at 02/20/21 1519 °  pantoprazole (PROTONIX) EC tablet 40 mg, 40 mg, Oral, QHS, Robertson, Crystal S, RPH, 40 mg at 02/21/21 2202 °  senna-docusate (Senokot-S) tablet 1 tablet, 1 tablet, Oral, BID, Lindzen, Eric, MD, 1 tablet at  02/21/21 2202 °  sodium chloride flush (NS) 0.9 % injection 3 mL, 3 mL, Intravenous, Once, Goldston, Scott, MD ° °Patients Current Diet:  °Diet Order   ° °       °  Diet regular Room service appropriate? Yes; Fluid consistency: Thin  Diet effective now       °  ° °  °  ° °  ° ° °Precautions / Restrictions °Precautions °Precautions: Fall °Precaution Comments: ataxia °Restrictions °Weight Bearing Restrictions: No  ° °Has the patient had 2 or more falls or a fall with injury in the past year? No ° °Prior Activity Level °Community (5-7x/wk): indep prior to admit, working as a pharmaceutical rep, driving, no DME used ° °Prior Functional Level °Self Care: Did the patient need help bathing, dressing, using the toilet or eating? Independent ° °Indoor Mobility: Did the patient need assistance with walking from room to room (with or without device)? Independent ° °Stairs: Did the patient need assistance with internal or external stairs (with or without device)? Independent ° °Functional Cognition: Did the patient need help planning regular tasks such as shopping or remembering to take medications? Independent ° °Patient Information °Are you of Hispanic, Latino/a,or Spanish origin?: A. No, not of Hispanic, Latino/a, or Spanish origin °What is your race?: B. Black or African American °Do you need or want an interpreter to communicate with a doctor or health care staff?: 0. No ° °Patient's Response To:  °Health Literacy and Transportation °Is the patient able to respond to health literacy and transportation needs?: Yes °Health Literacy - How often do you need to have someone help you when you read instructions, pamphlets, or other written material from your doctor or pharmacy?: Never °In the past 12 months, has lack of transportation kept you from medical appointments or from getting medications?: No °In the past 12 months, has lack of transportation kept you from meetings, work, or from getting things needed for daily living?:  No ° °Home Assistive Devices / Equipment °Home Assistive Devices/Equipment: None °Home Equipment: Grab bars - tub/shower ° °Prior Device Use: Indicate devices/aids used by the patient prior to current illness, exacerbation or injury? None of the above ° °Current Functional Level °Cognition ° Overall Cognitive Status: No family/caregiver present to determine baseline cognitive functioning °Current Attention Level: Selective °Orientation Level: Oriented X4 °Safety/Judgement: Decreased awareness of safety, Decreased awareness of deficits °General Comments: feels his balance is not that much different than prior to this stroke, but showed me a video of him riding his recumbent bike with his dog on a leash and states no difficulty accessing the bike °   °Extremity Assessment °(includes Sensation/Coordination) ° Upper Extremity Assessment: Overall WFL for tasks assessed  °Lower   Extremity Assessment: Generalized weakness (gross coordination deficits)  °  °ADLs ° Overall ADL's : Needs assistance/impaired °Eating/Feeding: Set up °Eating/Feeding Details (indicate cue type and reason): drinking from cup and noted to choke on water. pt unable to speak after and states when recovered "i have never coughed as much as i have in my life since ive been here" pt reports "its not the water its my coughing" °Grooming: Wash/dry face, Oral care, Minimal assistance, Standing °Lower Body Dressing: Minimal assistance, Sit to/from stand °Lower Body Dressing Details (indicate cue type and reason): pt attempting to don pants supine with R UE partially pinned due to lines but declining help. OT had to visually show patient the reason he was not able to reach further. OT had to reinforce letting pt attempt task but needing to help for a moment. pt failed don supine and OT suggested eob. pt unable to loop pants over foot initially and after multiple failed attempts able to complete. pt needs (A) for sit<>stand balance. Pt very guards to (A) and  requires verbal reassurance that he can maximize his attempt °Toilet Transfer: Moderate assistance, Ambulation, Regular Toilet °Functional mobility during ADLs: Moderate assistance, Cueing for sequencing, Cueing for safety °General ADL Comments: HHA  °  °Mobility ° Overal bed mobility: Needs Assistance °Bed Mobility: Supine to Sit °Rolling: Supervision °Supine to sit: Min assist °General bed mobility comments: for balance  °  °Transfers ° Overall transfer level: Needs assistance °Equipment used: Rolling walker (2 wheels) °Transfers: Sit to/from Stand °Sit to Stand: Min guard °General transfer comment: cues for balance/ weight shift over feet  °  °Ambulation / Gait / Stairs / Wheelchair Mobility ° Ambulation/Gait °Ambulation/Gait assistance: Mod assist, +2 safety/equipment °Gait Distance (Feet): 70 Feet (x 2) °Assistive device: Rolling walker (2 wheels) °Gait Pattern/deviations: Step-to pattern, Step-through pattern, Decreased stride length, Ataxic, Wide base of support, Shuffle, Decreased dorsiflexion - left °General Gait Details: ataxic pattern with only couple instances with head and shoulders flexed forward corrected with cues for tucking hips; no issues with using walker today, but shaky and needing assist for balance cues for foot position; standing break in hallway to touch ornaments on Christmas Tree high and low with mod support when standing without UE support on walker °Gait velocity: dec °Gait velocity interpretation: <1.8 ft/sec, indicate of risk for recurrent falls  °  °Posture / Balance Dynamic Sitting Balance °Sitting balance - Comments: leaning over to don shoes, able to tie laces today, reports was unable yesterday due to L hand weakness °Balance °Overall balance assessment: Needs assistance °Sitting-balance support: Feet supported °Sitting balance-Leahy Scale: Good °Sitting balance - Comments: leaning over to don shoes, able to tie laces today, reports was unable yesterday due to L hand  weakness °Standing balance support: Reliant on assistive device for balance, Single extremity supported, No upper extremity supported °Standing balance-Leahy Scale: Poor °Standing balance comment: mod a with reaching when not holding walker  °  °Special needs/care consideration Diabetic management yes  ° °Previous Home Environment (from acute therapy documentation) °Living Arrangements: Children (son 20 yo [name]) °Available Help at Discharge: Family, Available PRN/intermittently °Type of Home: Apartment °Home Layout: One level °Home Access: Level entry °Bathroom Shower/Tub: Tub/shower unit °Bathroom Toilet: Standard °Home Care Services: No ° °Discharge Living Setting °Plans for Discharge Living Setting: Patient's home °Type of Home at Discharge: Apartment °Discharge Home Layout: One level °Discharge Home Access: Level entry °Discharge Bathroom Shower/Tub: Tub/shower unit °Discharge Bathroom Toilet: Standard °Discharge Bathroom Accessibility: Yes °How Accessible: Accessible via   walker °Does the patient have any problems obtaining your medications?: No ° °Social/Family/Support Systems °Anticipated Caregiver: mom, Bobby Eichel, and dad Pharrell Couvillon °Anticipated Caregiver's Contact Information: Bobby [phone]; Wyatte Sr [phone] °Ability/Limitations of Caregiver: min assist °Caregiver Availability: 24/7 °Discharge Plan Discussed with Primary Caregiver: Yes °Is Caregiver In Agreement with Plan?: Yes °Does Caregiver/Family have Issues with Lodging/Transportation while Pt is in Rehab?: No ° °Goals °Patient/Family Goal for Rehab: PT/OT supervision, SLP supervision to mod I °Expected length of stay: 8-11 days °Pt/Family Agrees to Admission and willing to participate: Yes °Program Orientation Provided & Reviewed with Pt/Caregiver Including Roles  & Responsibilities: Yes ° Barriers to Discharge: Home environment access/layout, Insurance for SNF coverage, Medication compliance ° °Decrease burden of Care through IP  rehab admission: no ° °Possible need for SNF placement upon discharge: no ° °Patient Condition: I have reviewed medical records from [facility], spoken with CM, and patient and family member. I met with patient at the bedside for inpatient rehabilitation assessment.  Patient will benefit from ongoing PT, OT, and SLP, can actively participate in 3 hours of therapy a day 5 days of the week, and can make measurable gains during the admission.  Patient will also benefit from the coordinated team approach during an Inpatient Acute Rehabilitation admission.  The patient will receive intensive therapy as well as Rehabilitation physician, nursing, social worker, and care management interventions.  Due to safety, skin/wound care, disease management, medication administration, pain management, and patient education the patient requires 24 hour a day rehabilitation nursing.  The patient is currently mod +2 with mobility and basic ADLs.  Discharge setting and therapy post discharge at home with home health is anticipated.  Patient has agreed to participate in the Acute Inpatient Rehabilitation Program and will admit today. ° °Preadmission Screen Completed By:  Pookela Sellin E Lailanie Hasley, PT, DPT 02/23/2021 10:12 AM °______________________________________________________________________   °Discussed status with Dr. Raulkar on 02/23/21  at 10:13 AM  and received approval for admission today. ° °Admission Coordinator:  Warden Buffa E Aarish Rockers, PT, DPT time 10:13 AM /Date 02/23/21   ° °Assessment/Plan: °Diagnosis: ICH °Does the need for close, 24 hr/day Medical supervision in concert with the patient's rehab needs make it unreasonable for this patient to be served in a less intensive setting? Yes °Co-Morbidities requiring supervision/potential complications: overweight BMI 28.63, CKD, hyperglycemia, HTN, hyperlipidemia °Due to bladder management, bowel management, safety, skin/wound care, disease management, medication administration, pain  management, and patient education, does the patient require 24 hr/day rehab nursing? Yes °Does the patient require coordinated care of a physician, rehab nurse, PT, OT to address physical and functional deficits in the context of the above medical diagnosis(es)? Yes °Addressing deficits in the following areas: balance, endurance, locomotion, strength, transferring, bowel/bladder control, bathing, dressing, feeding, grooming, toileting, and psychosocial support °Can the patient actively participate in an intensive therapy program of at least 3 hrs of therapy 5 days a week? Yes °The potential for patient to make measurable gains while on inpatient rehab is excellent °Anticipated functional outcomes upon discharge from inpatient rehab: modified independent PT, modified independent OT, modified independent SLP °Estimated rehab length of stay to reach the above functional goals is: 10-14 days °Anticipated discharge destination: Home °10. Overall Rehab/Functional Prognosis: excellent ° ° °MD Signature: °Krutika Raulkar, MD °  SLP Estimated rehab length of stay to reach the above functional goals is: 10-14 days Anticipated discharge destination: Home 10. Overall Rehab/Functional Prognosis: excellent     MD Signature: Leeroy Cha, MD

## 2021-02-23 NOTE — Progress Notes (Signed)
Patient received from 3W. Patient was put into room 13M-06. Patient oriented to unit and activities. Patient's BP was at 180/110. Hydralazine was ordered by Pam PA and was given to patient. Will recheck BP.    Gladstone Lighter, LPN

## 2021-02-23 NOTE — Progress Notes (Signed)
Patient ID: Carlos Rice, male   DOB: Mar 02, 1970, 51 y.o.   MRN: [id number]  Sw called pt mother Carlos Rice). Introduced self, explained role and addressed questions and concerns. SW will meet pt on Monday 1/9. Sw will cont to follow up

## 2021-02-23 NOTE — Progress Notes (Signed)
Inpatient Rehabilitation Admission Medication Review by a Pharmacist  A complete drug regimen review was completed for this patient to identify any potential clinically significant medication issues.  High Risk Drug Classes Is patient taking? Indication by Medication  Antipsychotic Yes Compazine for N/V  Anticoagulant No   Antibiotic No   Opioid No   Antiplatelet No   Hypoglycemics/insulin No   Vasoactive Medication Yes Amlodipine for BP  Chemotherapy No   Other No      Type of Medication Issue Identified Description of Issue Recommendation(s)  Drug Interaction(s) (clinically significant)     Duplicate Therapy     Allergy     No Medication Administration End Date     Incorrect Dose     Additional Drug Therapy Needed     Significant med changes from prior encounter (inform family/care partners about these prior to discharge).    Other       Clinically significant medication issues were identified that warrant physician communication and completion of prescribed/recommended actions by midnight of the next day:  No  Pharmacist comments: None  Time spent performing this drug regimen review (minutes):  20 minutes   Tad Moore 02/23/2021 1:51 PM

## 2021-02-23 NOTE — Progress Notes (Signed)
Pam PA notified of BP recheck 172/86. Order to give Mg scheduled at 6pm.

## 2021-02-23 NOTE — Progress Notes (Addendum)
Pam PA notified of admitting BP 158/102 by [place] LPN. Orders to recheck via manual after 1mins. BP recheck manual at 1530; 180/110.Pam PA notified.

## 2021-02-23 NOTE — Progress Notes (Signed)
Inpatient Rehabilitation Care Coordinator Assessment and Plan Patient Details  Name: Randell Teare MRN: [id number] Date of Birth: 02-11-1971  Today's Date: 02/23/2021  Hospital Problems: Principal Problem:   ICH (intracerebral hemorrhage) ([place])  Past Medical History:  Past Medical History:  Diagnosis Date   CKD (chronic kidney disease) stage 4, GFR 15-29 ml/min (HCC)    CVA (cerebral vascular accident) ([place])    High blood pressure    History of noncompliance with medical treatment    T2DM (type 2 diabetes mellitus) ([place])    Past Surgical History:  Past Surgical History:  Procedure Laterality Date   AMPUTATION TOE Right    great toe   AMPUTATION TOE Left    5th toe   DG GREAT TOE RIGHT FOOT     great toe     ROTATOR CUFF REPAIR Right 2018   Social History:  reports that he has quit smoking. His smoking use included cigarettes. He has been exposed to tobacco smoke. He has never used smokeless tobacco. He reports that he does not drink alcohol and does not use drugs.  Family / Support Systems Other Supports: Mortimer Fries (Mother), Collier Salina (Father) Anticipated Caregiver: mother and father Ability/Limitations of Caregiver: min A Caregiver Availability: 24/7 Family Dynamics: support from son and parents  Social History Preferred language: English Religion:  Health Literacy - How often do you need to have someone help you when you read instructions, pamphlets, or other written material from your doctor or pharmacy?: Never Writes: Yes Employment Status: Employed Name of Employer: Water quality scientist Issues: n/a Guardian/Conservator: n/a   Abuse/Neglect Abuse/Neglect Assessment Can Be Completed: Yes Physical Abuse: Denies Verbal Abuse: Denies Sexual Abuse: Denies Exploitation of patient/patient's resources: Denies Self-Neglect: Denies  Patient response to: Social Isolation - How often do you feel lonely or isolated from those around you?:  Never  Emotional Status Recent Psychosocial Issues: coping Psychiatric History: n/a Substance Abuse History: n/a  Patient / Family Perceptions, Expectations & Goals Pt/Family understanding of illness & functional limitations: yes Premorbid pt/family roles/activities: previosuly independent overall Anticipated changes in roles/activities/participation: family to assist at d/c Pt/family expectations/goals: Claude: None Premorbid Home Care/DME Agencies: Other (Comment) ([facility] [facility]) Transportation available at discharge: family able to transport Is the patient able to respond to transportation needs?: Yes In the past 12 months, has lack of transportation kept you from medical appointments or from getting medications?: No In the past 12 months, has lack of transportation kept you from meetings, work, or from getting things needed for daily living?: No Resource referrals recommended: Neuropsychology  Discharge Planning Living Arrangements: Children (52 y.o son) Support Systems: Children, Parent Type of Residence: Private residence Insurance Resources: Multimedia programmer (specify) ([employer] Medicare) Financial Resources: Employment Museum/gallery curator Screen Referred: Yes Living Expenses: Lives with family Money Management: Patient Does the patient have any problems obtaining your medications?: No Home Management: Independent Patient/Family Preliminary Plans: family to assist Care Coordinator Barriers to Discharge: Lack of/limited family support, Insurance for SNF coverage Care Coordinator Anticipated Follow Up Needs: HH/OP Expected length of stay: 8-11 days  Clinical Impression Sw called pt mother Mortimer Fries). Introduced self, explained role and addressed questions and concerns. SW will meet pt on Monday 1/9. Sw will cont to follow up          Dyanne Iha 02/23/2021, 2:46 PM

## 2021-02-23 NOTE — Progress Notes (Signed)
Nutrition Brief Note  RD consulted for diet education. Pt unavailable during attempted time of contact. RD to follow up for education/nutrition assessment.   Corrin Parker, MS, RD, LDN RD pager number/after hours weekend pager number on Amion.

## 2021-02-23 NOTE — TOC Transition Note (Signed)
Transition of Care [facility]) - CM/SW Discharge Note   Patient Details  Name: Carlos Rice MRN: [id number] Date of Birth: [birth year]  [facility] CM/SW Contact:  Pollie Friar, RN Phone Number: 02/23/2021, 11:56 AM   Clinical Narrative:    Patient is discharging to CIR today. CM signing off.    Final next level of care: IP Rehab Facility Barriers to Discharge: No Barriers Identified   Patient Goals and CMS Choice     Choice offered to / list presented to : Patient  Discharge Placement                       Discharge Plan and Services                                     Social Determinants of Health (SDOH) Interventions     Readmission Risk Interventions No flowsheet data found.

## 2021-02-23 NOTE — H&P (Signed)
Physical Medicine and Rehabilitation Admission H&P    Chief Complaint  Patient presents with   Functional deficits due to stroke    HPI: Carlos Rice is a 51 year old male with history of T2DM, CKD, CVA, HTN who was admitted on 02/17/21 with fall out of his car with acute onset of left sided weakness with inability to stand. BP 250/146 on enroute to ED. CT head done revealing acute hemorrhage in right lentiform nucleus and external capsule with minimal surrounding edema. The [place] felt to be hypertensive in nature and he was started on clevidipine drip for BP management w/ SBP goal 1<160.  Patient with history of medication refusal and has been using herbals for BP management. He was educated on non-pharmacologic measures to mange stress in addition to BP med per discussion with Dr. [name]. 2D echo not done as patient continues to decline this. BP improving with amlodipine on board. Therapy ongoing and patient limited by balance deficits with ataxia and generalized weakness. CIR recommended due to functional decline. He feels that his elevated BP was precipitated by family stressors.   Review of Systems  Constitutional: Negative.   HENT: Negative.    Eyes: Negative.   Respiratory: Negative.    Cardiovascular: Negative.   Gastrointestinal: Negative.   Genitourinary: Negative.   Skin: Negative.   Neurological:  Positive for sensory change and weakness.  Psychiatric/Behavioral: Negative.      Past Medical History:  Diagnosis Date   CKD (chronic kidney disease) stage 4, GFR 15-29 ml/min (HCC)    CVA (cerebral vascular accident) ([place])    High blood pressure    History of noncompliance with medical treatment    T2DM (type 2 diabetes mellitus) ([place])     Past Surgical History:  Procedure Laterality Date   AMPUTATION TOE Right    great toe   AMPUTATION TOE Left    5th toe   DG GREAT TOE RIGHT FOOT     great toe     ROTATOR CUFF REPAIR Right 2018     History reviewed. No pertinent  family history.   Social History:  reports that he has quit smoking. His smoking use included cigarettes. He has been exposed to tobacco smoke. He has never used smokeless tobacco. He reports that he does not drink alcohol and does not use drugs.   Allergies: No Known Allergies   Medications Prior to Admission  Medication Sig Dispense Refill   [START ON 02/24/2021] amLODipine (NORVASC) 10 MG tablet Take 1 tablet (10 mg total) by mouth daily.     pantoprazole (PROTONIX) 40 MG tablet Take 1 tablet (40 mg total) by mouth at bedtime.     senna-docusate (SENOKOT-S) 8.6-50 MG tablet Take 1 tablet by mouth 2 (two) times daily.      Drug Regimen Review  Drug regimen was reviewed and remains appropriate with no significant issues identified   Home: Home Living Family/patient expects to be discharged to:: Private residence Living Arrangements: Children (son 106 yo [name]) Available Help at Discharge: Family, Available PRN/intermittently Type of Home: Apartment Home Access: Level entry Home Layout: One level Bathroom Shower/Tub: Chiropodist: Standard Home Equipment: Grab bars - tub/shower   Functional History: Prior Function Prior Level of Function : Independent/Modified Independent, Working/employed, Driving Mobility Comments: no AD, drives, does grocery shopping ADLs Comments: indep   Functional Status:  Mobility: Bed Mobility Overal bed mobility: Needs Assistance Bed Mobility: Supine to Sit Rolling: Supervision Supine to sit: Min assist General bed  mobility comments: for balance Transfers Overall transfer level: Needs assistance Equipment used: Rolling walker (2 wheels) Transfers: Sit to/from Stand Sit to Stand: Min guard General transfer comment: cues for balance/ weight shift over feet Ambulation/Gait Ambulation/Gait assistance: Mod assist, +2 safety/equipment Gait Distance (Feet): 70 Feet (x 2) Assistive device: Rolling walker (2 wheels) Gait  Pattern/deviations: Step-to pattern, Step-through pattern, Decreased stride length, Ataxic, Wide base of support, Shuffle, Decreased dorsiflexion - left General Gait Details: ataxic pattern with only couple instances with head and shoulders flexed forward corrected with cues for tucking hips; no issues with using walker today, but shaky and needing assist for balance cues for foot position; standing break in hallway to touch ornaments on Christmas Tree high and low with mod support when standing without UE support on walker Gait velocity: dec Gait velocity interpretation: <1.8 ft/sec, indicate of risk for recurrent falls   ADL: ADL Overall ADL's : Needs assistance/impaired Eating/Feeding: Set up Eating/Feeding Details (indicate cue type and reason): drinking from cup and noted to choke on water. pt unable to speak after and states when recovered "i have never coughed as much as i have in my life since ive been here" pt reports "its not the water its my coughing" Grooming: Wash/dry face, Oral care, Minimal assistance, Standing Lower Body Dressing: Minimal assistance, Sit to/from stand Lower Body Dressing Details (indicate cue type and reason): pt attempting to don pants supine with R UE partially pinned due to lines but declining help. OT had to visually show patient the reason he was not able to reach further. OT had to reinforce letting pt attempt task but needing to help for a moment. pt failed don supine and OT suggested eob. pt unable to loop pants over foot initially and after multiple failed attempts able to complete. pt needs (A) for sit<>stand balance. Pt very guards to (A) and requires verbal reassurance that he can maximize his attempt Toilet Transfer: Moderate assistance, Ambulation, Regular Toilet Functional mobility during ADLs: Moderate assistance, Cueing for sequencing, Cueing for safety General ADL Comments: HHA   Cognition: Cognition Overall Cognitive Status: No family/caregiver  present to determine baseline cognitive functioning Orientation Level: Oriented X4 Cognition Arousal/Alertness: Awake/alert Behavior During Therapy: Impulsive Overall Cognitive Status: No family/caregiver present to determine baseline cognitive functioning Area of Impairment: Safety/judgement, Attention Orientation Level: Time (reports 1/3. reports he has been here for 3 days.) Current Attention Level: Selective Safety/Judgement: Decreased awareness of safety, Decreased awareness of deficits General Comments: feels his balance is not that much different than prior to this stroke, but showed me a video of him riding his recumbent bike with his dog on a leash and states no difficulty accessing the bike     Blood pressure (!) 180/110, pulse 82, temperature (!) 97.5 F (36.4 C), temperature source Oral, resp. rate 18, height 5\' 8"  (1.727 m), weight 80.2 kg, SpO2 100 %. Physical Exam Gen: no distress, normal appearing HEENT: oral mucosa pink and moist, NCAT Cardio: Reg rate Chest: normal effort, normal rate of breathing Abd: soft, non-distended Ext: no edema Psych: pleasant, normal affect, conversational, good medical historian Skin: intact Neuro: Alert and oriented x3, Strength 5/5 throughout   except for left 4/5 handgrip. Sensation intact bilaterally.  No results found for this or any previous visit (from the past 48 hour(s)). No results found.  Medical Problem List and Plan: 1. Functional deficits secondary to acute hemorrhage of right lentiform and external capsule  -patient may shower  -ELOS/Goals: 10-14 days modI  Admit to  CIR 2.  Antithrombotics: -DVT/anticoagulation:  Mechanical: Sequential compression devices, below knee Bilateral lower extremities  -antiplatelet therapy: N/A due to [place] 3. Pain Management: tylenol prn.  4. Mood: LCSW to follow for evaluation and support. Team to provide ego support.   -antipsychotic agents: N/A 5. Neuropsych: This patient is capable of  making decisions on his own behalf. 6. Skin/Wound Care: Routine pressure relief measures.  7. Fluids/Electrolytes/Nutrition: Monitor I/O. Check BMET in am 8. HTN: Monitor BP TID--continue Norvasc 9. T2DM: Hgb A1C-6.8. CM diet with diet education by RD. --fasting BS 105-111 range. Will monitor BS ac/hs  10. Stage IV CKD: BUN/SCr 47/4.58 at admission.  18. H/o stroke: Has declined statin in the past. Discussed importance of minimizing stress.  12. Anemia of chronic disease?: Will recheck CBC in am.  24. Hyperlipidemia: Chol-251, LDL-131 and Trig-154.   --has refused statin in the past.  14. Medication non-compliance: Will limit medications to essential only.  15. Overweight BMI 26.88: encouraged his diet and exercise habits, prior weight loss.    I have personally performed a face to face diagnostic evaluation, including, but not limited to relevant history and physical exam findings, of this patient and developed relevant assessment and plan.  Additionally, I have reviewed and concur with the physician assistant's documentation above.  Bary Leriche, PA-C   Izora Ribas, MD 02/23/2021

## 2021-02-23 NOTE — Progress Notes (Signed)
Inpatient Rehabilitation  Patient information reviewed and entered into eRehab system by Amelita Risinger M. Leyli Kevorkian, M.A., CCC/SLP, PPS Coordinator.  Information including medical coding, functional ability and quality indicators will be reviewed and updated through discharge.    

## 2021-02-24 LAB — COMPREHENSIVE METABOLIC PANEL
ALT: 11 U/L (ref 0–44)
AST: 16 U/L (ref 15–41)
Albumin: 3.3 g/dL — ABNORMAL LOW (ref 3.5–5.0)
Alkaline Phosphatase: 39 U/L (ref 38–126)
Anion gap: 7 (ref 5–15)
BUN: 32 mg/dL — ABNORMAL HIGH (ref 6–20)
CO2: 25 mmol/L (ref 22–32)
Calcium: 9 mg/dL (ref 8.9–10.3)
Chloride: 103 mmol/L (ref 98–111)
Creatinine, Ser: 5.13 mg/dL — ABNORMAL HIGH (ref 0.61–1.24)
GFR, Estimated: 13 mL/min — ABNORMAL LOW (ref >60)
Glucose, Bld: 112 mg/dL — ABNORMAL HIGH (ref 70–99)
Potassium: 4.1 mmol/L (ref 3.5–5.1)
Sodium: 135 mmol/L (ref 135–145)
Total Bilirubin: 0.8 mg/dL (ref 0.3–1.2)
Total Protein: 6.5 g/dL (ref 6.5–8.1)

## 2021-02-24 LAB — CBC WITH DIFFERENTIAL/PLATELET
Abs Immature Granulocytes: 0.01 10*3/uL (ref 0.00–0.07)
Basophils Absolute: 0 10*3/uL (ref 0.0–0.1)
Basophils Relative: 1 %
Eosinophils Absolute: 0.1 10*3/uL (ref 0.0–0.5)
Eosinophils Relative: 2 %
HCT: 36.3 % — ABNORMAL LOW (ref 39.0–52.0)
Hemoglobin: 12.6 g/dL — ABNORMAL LOW (ref 13.0–17.0)
Immature Granulocytes: 0 %
Lymphocytes Relative: 37 %
Lymphs Abs: 1.9 10*3/uL (ref 0.7–4.0)
MCH: 32.4 pg (ref 26.0–34.0)
MCHC: 34.7 g/dL (ref 30.0–36.0)
MCV: 93.3 fL (ref 80.0–100.0)
Monocytes Absolute: 0.6 10*3/uL (ref 0.1–1.0)
Monocytes Relative: 11 %
Neutro Abs: 2.5 10*3/uL (ref 1.7–7.7)
Neutrophils Relative %: 49 %
Platelets: 275 10*3/uL (ref 150–400)
RBC: 3.89 MIL/uL — ABNORMAL LOW (ref 4.22–5.81)
RDW: 11.9 % (ref 11.5–15.5)
WBC: 5 10*3/uL (ref 4.0–10.5)
nRBC: 0 % (ref 0.0–0.2)

## 2021-02-24 LAB — MAGNESIUM: Magnesium: 2.2 mg/dL (ref 1.7–2.4)

## 2021-02-24 NOTE — Evaluation (Signed)
Speech Language Pathology Assessment Only  Patient Details  Name: Carlos Rice MRN: [id number] Date of Birth: [birth year]  Today's Date: 02/24/2021 SLP Individual Time: 1002-1055 SLP Individual Time Calculation (min): 68 min  Hospital Problem: Principal Problem:   ICH (intracerebral hemorrhage) ([place])  Past Medical History:  Past Medical History:  Diagnosis Date   CKD (chronic kidney disease) stage 4, GFR 15-29 ml/min (HCC)    CVA (cerebral vascular accident) ([place])    High blood pressure    History of noncompliance with medical treatment    T2DM (type 2 diabetes mellitus) ([place])    Past Surgical History:  Past Surgical History:  Procedure Laterality Date   AMPUTATION TOE Right    great toe   AMPUTATION TOE Left    5th toe   DG GREAT TOE RIGHT FOOT     great toe     ROTATOR CUFF REPAIR Right 2018    Assessment / Plan / Recommendation Clinical Impression  Patient is a 51 y.o. year old male with history of T2DM, CKD, CVA, HTN who was admitted on 02/17/21 with fall out of his car with acute onset of left sided weakness with inability to stand. BP 250/146 on enroute to ED. CT head done revealing acute hemorrhage in right lentiform nucleus and external capsule with minimal surrounding edema. [place] felt to be hypertensive in nature and he was started on clevidipine drip for BP management w/ SBP goal 1<160.  Patient with history of medication refusal and has been using herbals for BP management. He was educated on non-pharmacologic measures to mange stress in addition to BP med per discussion with Dr. [name]. 2D echo not done as patient continues to decline this. BP improving with amlodipine on board. Therapy ongoing and patient limited by balance deficits with ataxia and generalized weakness. CIR recommended due to functional decline. He feels that his elevated BP was precipitated by family stressors.  Patient transferred to CIR on 02/23/2021.  Pt presents with adequate and functional speech,  swallow and cognition at this time. Pt speech is 100% intelligible at complex conversation and pt politely declines further therapeutic interventions. Reviewed continuing use of slow rate and over articulation when needed. Pt is currently tolerating a regular/thin diet with strict dietary preferences, no overt s/s aspiration. Pt endorses intermittent wet vocal quality and cough but is managing with effortful swallow and throat clear. Pt reports being at baseline function for cognition. Pt speaks about holistic medical care and not agreeable to pharmaceuticals but states he is aware his blood pressure is high and he agrees to take blood pressure medication at this time. Pt denies speech therapy needs at this time.     Skilled Therapeutic Interventions          Pt participating in non-standardized assessment of speech, language and cognitive function. Please see above.   SLP Assessment  Patient does not need any further Speech Lanaguage Pathology Services             Pain Pain Assessment Pain Scale: 0-10 Pain Score: 0-No pain  Prior Functioning Cognitive/Linguistic Baseline: Within functional limits Type of Home: Apartment Available Help at Discharge: Family;Available PRN/intermittently Vocation: Full time employment  SLP Evaluation Cognition Overall Cognitive Status: Within Functional Limits for tasks assessed Arousal/Alertness: Awake/alert Orientation Level: Oriented X4 Attention: Alternating;Sustained Sustained Attention: Appears intact Alternating Attention: Appears intact Memory: Appears intact Awareness: Appears intact Problem Solving: Appears intact Safety/Judgment: Appears intact  Comprehension Auditory Comprehension Overall Auditory Comprehension: Appears within functional limits for tasks assessed  Expression Expression Primary Mode of Expression: Verbal Verbal Expression Overall Verbal Expression: Appears within functional limits for tasks assessed Oral Motor Oral  Motor/Sensory Function Overall Oral Motor/Sensory Function: Mild impairment Facial ROM: Within Functional Limits Facial Symmetry: Abnormal symmetry left;Suspected CN VII (facial) dysfunction Facial Strength: Within Functional Limits Facial Sensation: Within Functional Limits Lingual ROM: Within Functional Limits Lingual Symmetry: Within Functional Limits Lingual Strength: Within Functional Limits Lingual Sensation: Within Functional Limits Motor Speech Overall Motor Speech: Appears within functional limits for tasks assessed Respiration: Within functional limits Phonation: Normal Resonance: Within functional limits Articulation: Within functional limitis Effective Techniques: Slow rate;Over-articulate  Care Tool Care Tool Cognition Ability to hear (with hearing aid or hearing appliances if normally used Ability to hear (with hearing aid or hearing appliances if normally used): 0. Adequate - no difficulty in normal conservation, social interaction, listening to TV   Expression of Ideas and Wants Expression of Ideas and Wants: 4. Without difficulty (complex and basic) - expresses complex messages without difficulty and with speech that is clear and easy to understand   Understanding Verbal and Non-Verbal Content Understanding Verbal and Non-Verbal Content: 4. Understands (complex and basic) - clear comprehension without cues or repetitions  Memory/Recall Ability Memory/Recall Ability : Current season;That he or she is in a hospital/hospital unit   Recommendations for other services: None  The above assessment, treatment alternatives and goals were discussed and mutually agreed upon: by patient  Dewaine Conger 02/24/2021, 10:51 AM

## 2021-02-24 NOTE — Evaluation (Addendum)
Physical Therapy Assessment and Plan  Patient Details  Name: Carlos Rice MRN: [id number] Date of Birth: October 02, 1970  PT Diagnosis: Abnormality of gait, Ataxia, Ataxic gait, Coordination disorder, and Hemiplegia dominant Rehab Potential: Good ELOS: 7-10 days   Today's Date: 02/24/2021 PT Individual Time: 1310-1418 PT Individual Time Calculation (min): 68 min    Hospital Problem: Principal Problem:   ICH (intracerebral hemorrhage) ([place])   Past Medical History:  Past Medical History:  Diagnosis Date   CKD (chronic kidney disease) stage 4, GFR 15-29 ml/min (HCC)    CVA (cerebral vascular accident) ([place])    High blood pressure    History of noncompliance with medical treatment    T2DM (type 2 diabetes mellitus) ([place])    Past Surgical History:  Past Surgical History:  Procedure Laterality Date   AMPUTATION TOE Right    great toe   AMPUTATION TOE Left    5th toe   DG GREAT TOE RIGHT FOOT     great toe     ROTATOR CUFF REPAIR Right 2018    Assessment & Plan Clinical Impression: Patient is a 51 year old male with history of T2DM, CKD, CVA, HTN who was admitted on 02/17/21 with fall out of his car with acute onset of left sided weakness with inability to stand. BP 250/146 on enroute to ED. CT head done revealing acute hemorrhage in right lentiform nucleus and external capsule with minimal surrounding edema. [place] felt to be hypertensive in nature and he was started on clevidipine drip for BP management w/ SBP goal 1<160.  Patient with history of medication refusal and has been using herbals for BP management. He was educated on non-pharmacologic measures to mange stress in addition to BP med per discussion with Dr. [name]. 2D echo not done as patient continues to decline this. BP improving with amlodipine on board. Therapy ongoing and patient limited by balance deficits with ataxia and generalized weakness.   Patient transferred to CIR on 02/23/2021 .   Patient currently requires mod with  mobility secondary to muscle weakness, decreased cardiorespiratoy endurance, impaired timing and sequencing, unbalanced muscle activation, ataxia, and decreased coordination, and decreased sitting balance, decreased standing balance, decreased postural control, hemiplegia, and decreased balance strategies.  Prior to hospitalization, patient was independent  with mobility and lived with Son (38 yo) in a [place] home.  Home access is  Level entry.  Patient will benefit from skilled PT intervention to maximize safe functional mobility, minimize fall risk, and decrease caregiver burden for planned discharge home with intermittent assist.  Anticipate patient will benefit from follow up OP at discharge.  PT - End of Session Activity Tolerance: Tolerates 30+ min activity with multiple rests Endurance Deficit: Yes PT Assessment Rehab Potential (ACUTE/IP ONLY): Good PT Barriers to Discharge: Decreased caregiver support;Medication compliance;Behavior PT Patient demonstrates impairments in the following area(s): Balance;Behavior;Endurance;Motor;Safety;Sensory PT Transfers Functional Problem(s): Bed Mobility;Bed to Chair;Car;Furniture;Floor PT Locomotion Functional Problem(s): Ambulation;Wheelchair Mobility;Stairs PT Plan PT Intensity: Minimum of 1-2 x/day ,45 to 90 minutes PT Frequency: 5 out of 7 days PT Duration Estimated Length of Stay: 7-10 days PT Treatment/Interventions: Ambulation/gait training;Balance/vestibular training;Cognitive remediation/compensation;Disease management/prevention;Discharge planning;Community reintegration;DME/adaptive equipment instruction;Functional electrical stimulation;Functional mobility training;Patient/family education;Pain management;Neuromuscular re-education;Psychosocial support;Skin care/wound management;Splinting/orthotics;Therapeutic Exercise;Therapeutic Activities;Stair training;UE/LE Strength taining/ROM;UE/LE Coordination activities;Visual/perceptual  remediation/compensation;Wheelchair propulsion/positioning PT Transfers Anticipated Outcome(s): Mod I with LRAD PT Locomotion Anticipated Outcome(s): SUperivsion assist gait with LRAD PT Recommendation Recommendations for Other Services: Therapeutic Recreation consult;Neuropsych consult Therapeutic Recreation Interventions: Stress management;Outing/community reintergration Follow Up Recommendations: Outpatient PT Patient destination:  Home Equipment Recommended: Rolling walker with 5" wheels   PT Evaluation Precautions/Restrictions Precautions Precautions: Fall Precaution Comments: L lean in standing;  Chronic R sided Ataxia/hemi; right toe amputation Restrictions Weight Bearing Restrictions: No General   Vital SignsTherapy Vitals Pulse Rate: 89 Resp: 16 BP: (!) 161/84 Pain Pain Assessment Pain Scale: 0-10 Pain Score: 0-No pain Pain Interference Pain Interference Pain Effect on Sleep: 2. Occasionally Pain Interference with Therapy Activities: 3. Frequently Pain Interference with Day-to-Day Activities: 3. Frequently Home Living/Prior Functioning Home Living Available Help at Discharge: Family;Available PRN/intermittently (parents live next door and are retired) Type of Home: Apartment Home Access: Level entry Home Layout: One level Bathroom Shower/Tub: Chiropodist: Standard  Lives With: Son (49 yo) Prior Function Level of Independence: Independent with basic ADLs;Independent with homemaking with ambulation;Independent with transfers;Independent with gait  Able to Take Stairs?: Yes Driving: Yes Vocation: Full time employment Vision/Perception  Vision - History Ability to See in Adequate Light: 1 Impaired Vision - Assessment Eye Alignment: Within Functional Limits Ocular Range of Motion: Within Functional Limits Alignment/Gaze Preference: Within Defined Limits Tracking/Visual Pursuits: Able to track stimulus in all quads without  difficulty Saccades: Within functional limits Convergence: Impaired (comment) Diplopia Assessment: Disappears with one eye closed Perception Perception: Within Functional Limits Praxis Praxis: Impaired Praxis Impairment Details: Motor planning  Cognition Overall Cognitive Status: Within Functional Limits for tasks assessed Arousal/Alertness: Awake/alert Year: 2023 Month: January Day of Week: Correct Attention: Alternating;Sustained Sustained Attention: Appears intact Alternating Attention: Appears intact Memory: Appears intact Immediate Memory Recall: Sock;Blue;Bed Memory Recall Sock: Without Cue Memory Recall Blue: Without Cue Memory Recall Bed: Without Cue Awareness: Appears intact Problem Solving: Appears intact Safety/Judgment: Appears intact Comments: Good insight into deficits and need for self-correction/safety measures. Sensation Sensation Light Touch: Appears Intact Hot/Cold: Not tested Proprioception: Appears Intact Stereognosis: Appears Intact Additional Comments: Able to detect L lean in stance Coordination Gross Motor Movements are Fluid and Coordinated: No Fine Motor Movements are Fluid and Coordinated: No Coordination and Movement Description: ataxia on the R side UE>LE Finger Nose Finger Test: ataxia on the R Heel Shin Test: grossly withing functional limits R vs L 9 Hole Peg Test: R: 1 min 39 seconds Motor  Motor Motor: Ataxia;Hemiplegia Motor - Skilled Clinical Observations: R sided hemiplegia and Bil ataxia R>L   Trunk/Postural Assessment  Cervical Assessment Cervical Assessment: Within Functional Limits Thoracic Assessment Thoracic Assessment: Within Functional Limits Lumbar Assessment Lumbar Assessment: Exceptions to [facility] (mild posterior pelvic tilt in sitting) Postural Control Postural Control: Deficits on evaluation (heavy reliance on BUE support in stance, L lean)  Balance Balance Balance Assessed: Yes Standardized Balance  Assessment Standardized Balance Assessment: FIST;Timed Up and Go Test;Berg Balance Test Berg Balance Test Sit to Stand: Able to stand  independently using hands Standing Unsupported: Needs several tries to stand 30 seconds unsupported Sitting with Back Unsupported but Feet Supported on Floor or Stool: Able to sit safely and securely 2 minutes Stand to Sit: Controls descent by using hands Transfers: Able to transfer with verbal cueing and /or supervision Standing Unsupported with Eyes Closed: Needs help to keep from falling Standing Ubsupported with Feet Together: Needs help to attain position and unable to hold for 15 seconds From Standing, Reach Forward with Outstretched Arm: Loses balance while trying/requires external support From Standing Position, Pick up Object from Floor: Unable to try/needs assist to keep balance From Standing Position, Turn to Look Behind Over each Shoulder: Needs supervision when turning Turn 360 Degrees: Needs assistance while turning  Standing Unsupported, Alternately Place Feet on Step/Stool: Needs assistance to keep from falling or unable to try Standing Unsupported, One Foot in Front: Loses balance while stepping or standing Standing on One Leg: Unable to try or needs assist to prevent fall Total Score: 14 Function in Sitting Test = FIST Anterior Nudge: superior sternum: Independent Posterior Nudge: between scapular spines: Independent Lateral Nudge: to dominant side at acromion: Independent Static Sitting: 30 seconds: Independent Sitting, Shake "No": left and right: Independent Sitting, Eyes Closed: 30 seconds: Independent Sitting, Lift Foot: dominant side, lift foot 1 inch twice: Independent Pick Up Object From Behind: object at midline, hands breadth posterior: Independent Forward Reach: use dominant arm, must complete full motion: Independent Lateral Reach: use dominant arm, clear opposite ischial tuberosity: Verbal cues/increased time Pick Up Object  From Floor: from between feet: Independent Posterior Scooting: move backwards 2 inches: Independent Anterior Scooting: move forward 2 inches: Independent Lateral Scooting: move to dominent side 2 inches: Independent FIST TOTAL SCORE: 55 Timed Up and Go Test TUG: Normal TUG Normal TUG (seconds): 37 (with RW) Static Sitting Balance Static Sitting - Balance Support: Feet supported Static Sitting - Level of Assistance: 6: Modified independent (Device/Increase time) Dynamic Sitting Balance Dynamic Sitting - Balance Support: Feet supported;No upper extremity supported Dynamic Sitting - Level of Assistance: 5: Stand by assistance Static Standing Balance Static Standing - Balance Support: Bilateral upper extremity supported Static Standing - Level of Assistance: 4: Min assist Dynamic Standing Balance Dynamic Standing - Balance Support: Bilateral upper extremity supported;During functional activity Dynamic Standing - Level of Assistance: 4: Min assist;3: Mod assist Extremity Assessment  RUE Assessment RUE Assessment: Exceptions to [facility] Active Range of Motion (AROM) Comments: ~100 degrees shoulder flexion General Strength Comments: 4/5 in shoulder flexion, chronic mild R hemi with RUE ataxia; 53 lbs grip strength RUE Body System: Neuro Brunstrum levels for arm and hand: Arm;Hand Brunstrum level for arm: Stage IV Movement is deviating from synergy Brunstrum level for hand: Stage V Independence from basic synergies LUE Assessment LUE Assessment: Within Functional Limits (grossly WFL for ADL) General Strength Comments: 5/5 in shoulder flexion, 47.5 lb grip strength      Care Tool Care Tool Bed Mobility Roll left and right activity   Roll left and right assist level: Supervision/Verbal cueing    Sit to lying activity   Sit to lying assist level: Supervision/Verbal cueing    Lying to sitting on side of bed activity   Lying to sitting on side of bed assist level: the ability to move from  lying on the back to sitting on the side of the bed with no back support.: Supervision/Verbal cueing     Care Tool Transfers Sit to stand transfer   Sit to stand assist level: Moderate Assistance - Patient 50 - 74%    Chair/bed transfer   Chair/bed transfer assist level: Moderate Assistance - Patient 50 - 74%     Toilet transfer   Assist Level: Minimal Assistance - Patient > 75%    Car transfer   Car transfer assist level: Moderate Assistance - Patient 50 - 74%      Care Tool Locomotion Ambulation   Assist level: Moderate Assistance - Patient 50 - 74% Assistive device: Hand held assist Max distance: 30  Walk 10 feet activity   Assist level: Moderate Assistance - Patient - 50 - 74% Assistive device: Hand held assist   Walk 50 feet with 2 turns activity Walk 50 feet with 2 turns activity did not occur:  Safety/medical concerns      Walk 150 feet activity Walk 150 feet activity did not occur: Safety/medical concerns      Walk 10 feet on uneven surfaces activity Walk 10 feet on uneven surfaces activity did not occur: Safety/medical concerns      Stairs Stair activity did not occur: Safety/medical concerns        Walk up/down 1 step activity Walk up/down 1 step or curb (drop down) activity did not occur: Safety/medical concerns      Walk up/down 4 steps activity Walk up/down 4 steps activity did not occur: Safety/medical concerns      Walk up/down 12 steps activity Walk up/down 12 steps activity did not occur: Safety/medical concerns      Pick up small objects from floor   Pick up small object from the floor assist level: Total Assistance - Patient < 25%    Wheelchair Is the patient using a wheelchair?: Yes Type of Wheelchair: Manual   Wheelchair assist level: Minimal Assistance - Patient > 75% Max wheelchair distance: 13ft  Wheel 50 feet with 2 turns activity   Assist Level: Minimal Assistance - Patient > 75%  Wheel 150 feet activity   Assist Level: Minimal  Assistance - Patient > 75%    Refer to Care Plan for Long Term Goals  SHORT TERM GOAL WEEK 1 PT Short Term Goal 1 (Week 1): STG=LTG due to ELOS  Recommendations for other services: Neuropsych and Therapeutic Recreation  Stress management and Outing/community reintegration  Skilled Therapeutic Intervention Mobility Bed Mobility Bed Mobility: Sit to Supine;Supine to Sit Rolling Right: Supervision/verbal cueing Rolling Left: Supervision/Verbal cueing Supine to Sit: Independent Sit to Supine: Independent Transfers Transfers: Sit to [employer] Transfers Sit to Stand: Minimal Assistance - Patient > 75% Stand Pivot Transfers: Minimal Assistance - Patient > 75% Stand Pivot Transfer Details: Verbal cues for technique;Verbal cues for sequencing;Verbal cues for precautions/safety;Verbal cues for safe use of DME/AE;Tactile cues for posture;Tactile cues for weight shifting Transfer (Assistive device): Standard walker Locomotion  Gait Ambulation: Yes Gait Assistance: Minimal Assistance - Patient > 75% Gait Distance (Feet): 150 Feet Assistive device: Rolling walker Gait Gait: Yes Gait Pattern: Impaired Gait Pattern: Narrow base of support;Ataxic;Left flexed knee in stance;Right flexed knee in stance Stairs / Additional Locomotion Stairs: Yes Stairs Assistance: Minimal Assistance - Patient > 75% Stair Management Technique: Two rails Number of Stairs: 4 Height of Stairs: 6 Wheelchair Mobility Wheelchair Mobility: Yes Wheelchair Assistance: Minimal assistance - Patient >75% Wheelchair Propulsion: Both upper extremities Wheelchair Parts Management: Needs assistance Distance: 110ft  Pt received sitting  EOB and agreeable to PT. PT instructed patient in PT Evaluation and initiated treatment intervention; see above for results. PT educated patient in [place], rehab potential, rehab goals, and discharge recommendations along with recommendation for follow-up rehabilitation services. Gait  training without A x 28ft with mod assist with severe ataxia. Gait training with RW x 171ft with min assist. Narrow BOS and ataxia as listed. Gait speed 0.5 m/s.  Pt unable to perform stair management without UE support. Performed with BUE support x 4 with min assist. Car transfer with BUE support on door frame. Did not listen to instruction for sit>pivot. Patient demonstrates increased fall risk as noted by score of   14/56 on Berg Balance Scale.  (<36= high risk for falls, close to 100%; 37-45 significant >80%; 46-51 moderate >50%; 52-55 lower >25%) PT instructed pt in TUG: 35 sec (average of 3 trials; >13.5 sec indicates increased fall risk).  Function In Sitting Test (FIST)  TOTAL = 55/56  Pt performed 5 time sit<>stand (5xSTS): 25 sec (>15 sec indicates increased fall risk)   Patient returned to room and performed stand pivot to recliner with min assist and RW. Pt left sitting in recliner with call bell in reach and all needs met.        Discharge Criteria: Patient will be discharged from PT if patient refuses treatment 3 consecutive times without medical reason, if treatment goals not met, if there is a change in medical status, if patient makes no progress towards goals or if patient is discharged from hospital.  The above assessment, treatment plan, treatment alternatives and goals were discussed and mutually agreed upon: by patient  Lorie Phenix 02/24/2021, 3:30 PM

## 2021-02-24 NOTE — Progress Notes (Signed)
PROGRESS NOTE   Subjective/Complaints:   Pt reports LBM last night- no issues- slept well.  ROS:  Pt denies SOB, abd pain, CP, N/V/C/D, and vision changes   Objective:   No results found. Recent Labs    02/24/21 0557  WBC 5.0  HGB 12.6*  HCT 36.3*  PLT 275   Recent Labs    02/24/21 0557  NA 135  K 4.1  CL 103  CO2 25  GLUCOSE 112*  BUN 32*  CREATININE 5.13*  CALCIUM 9.0    Intake/Output Summary (Last 24 hours) at 02/24/2021 1648 Last data filed at 02/24/2021 1400 Gross per 24 hour  Intake 180 ml  Output 1600 ml  Net -1420 ml        Physical Exam: Vital Signs Blood pressure (!) 148/76, pulse 78, temperature 97.9 F (36.6 C), temperature source Oral, resp. rate 16, height 5\' 8"  (1.727 m), weight 80.2 kg, SpO2 100 %.    General: awake, alert, appropriate, sleepy and laying supine in bed;  NAD HENT: conjugate gaze; oropharynx moist CV: regular rate; no JVD Pulmonary: CTA B/L; no W/R/R- good air movement GI: soft, NT, ND, (+)BS Psychiatric: appropriate Neurological: alert Skin: intact Neuro: Alert and oriented x3, Strength 5/5 throughout    except for left 4/5 handgrip. Sensation intact bilaterally.    Assessment/Plan: 1. Functional deficits which require 3+ hours per day of interdisciplinary therapy in a comprehensive inpatient rehab setting. Physiatrist is providing close team supervision and 24 hour management of active medical problems listed below. Physiatrist and rehab team continue to assess barriers to discharge/monitor patient progress toward functional and medical goals  Care Tool:  Bathing    Body parts bathed by patient: Right arm, Left arm, Chest, Abdomen, Front perineal area, Buttocks, Right upper leg, Left upper leg, Right lower leg, Left lower leg, Face         Bathing assist Assist Level: Minimal Assistance - Patient > 75%     Upper Body Dressing/Undressing Upper body  dressing   What is the patient wearing?: Pull over shirt    Upper body assist Assist Level: Independent    Lower Body Dressing/Undressing Lower body dressing      What is the patient wearing?: Pants     Lower body assist Assist for lower body dressing: Minimal Assistance - Patient > 75%     Toileting Toileting Toileting Activity did not occur (Clothing management and hygiene only): N/A (no void or bm)  Toileting assist Assist for toileting: Moderate Assistance - Patient 50 - 74%     Transfers Chair/bed transfer  Transfers assist     Chair/bed transfer assist level: Moderate Assistance - Patient 50 - 74%     Locomotion Ambulation   Ambulation assist      Assist level: Moderate Assistance - Patient 50 - 74% Assistive device: Hand held assist Max distance: 30   Walk 10 feet activity   Assist     Assist level: Moderate Assistance - Patient - 50 - 74% Assistive device: Hand held assist   Walk 50 feet activity   Assist Walk 50 feet with 2 turns activity did not occur: Safety/medical concerns  Walk 150 feet activity   Assist Walk 150 feet activity did not occur: Safety/medical concerns         Walk 10 feet on uneven surface  activity   Assist Walk 10 feet on uneven surfaces activity did not occur: Safety/medical concerns         Wheelchair     Assist Is the patient using a wheelchair?: Yes Type of Wheelchair: Manual    Wheelchair assist level: Minimal Assistance - Patient > 75% Max wheelchair distance: 117ft    Wheelchair 50 feet with 2 turns activity    Assist        Assist Level: Minimal Assistance - Patient > 75%   Wheelchair 150 feet activity     Assist      Assist Level: Minimal Assistance - Patient > 75%   Blood pressure (!) 148/76, pulse 78, temperature 97.9 F (36.6 C), temperature source Oral, resp. rate 16, height 5\' 8"  (1.727 m), weight 80.2 kg, SpO2 100 %.  Medical Problem List and  Plan: 1. Functional deficits secondary to acute hemorrhage of right lentiform and external capsule             -patient may shower             -ELOS/Goals: 10-14 days modI             first day of evaluations- con't CIR 2.  Antithrombotics: -DVT/anticoagulation:  Mechanical: Sequential compression devices, below knee Bilateral lower extremities             -antiplatelet therapy: N/A due to [place] 3. Pain Management: tylenol prn.  4. Mood: LCSW to follow for evaluation and support. Team to provide ego support.              -antipsychotic agents: N/A 5. Neuropsych: This patient is capable of making decisions on his own behalf. 6. Skin/Wound Care: Routine pressure relief measures.  7. Fluids/Electrolytes/Nutrition: Monitor I/O. Check BMET in am  1/7- electrolytes look good- con't to monitor 8. HTN: Monitor BP TID--continue Norvasc 9. T2DM: Hgb A1C-6.8. CM diet with diet education by RD. --fasting BS 105-111 range. Will monitor BS ac/hs 1/7- Bgs controlled- con't regimen  10. Stage IV CKD: BUN/SCr 47/4.58 at admission.   1/7- Cr 5.13- overall stable over last few days- will monitor- BUN 32 11. H/o stroke: Has declined statin in the past. Discussed importance of minimizing stress.  12. Anemia of chronic disease?: Will recheck CBC in am.  85. Hyperlipidemia: Chol-251, LDL-131 and Trig-154.              --has refused statin in the past.  14. Medication non-compliance: Will limit medications to essential only.  15. Overweight BMI 26.88: encouraged his diet and exercise habits, prior weight loss.                  LOS: 1 days A FACE TO FACE EVALUATION WAS PERFORMED  Carlos Rice 02/24/2021, 4:48 PM

## 2021-02-24 NOTE — Plan of Care (Signed)
Problem: RH Balance Goal: LTG Patient will maintain dynamic standing with ADLs (OT) Description: LTG:  Patient will maintain dynamic standing balance with assist during activities of daily living (OT)  Flowsheets (Taken 02/24/2021 1244) LTG: Pt will maintain dynamic standing balance during ADLs with: Independent with assistive device   Problem: Sit to Stand Goal: LTG:  Patient will perform sit to stand in prep for activites of daily living with assistance level (OT) Description: LTG:  Patient will perform sit to stand in prep for activites of daily living with assistance level (OT) Flowsheets (Taken 02/24/2021 1244) LTG: PT will perform sit to stand in prep for activites of daily living with assistance level: Independent with assistive device   Problem: RH Grooming Goal: LTG Patient will perform grooming w/assist,cues/equip (OT) Description: LTG: Patient will perform grooming with assist, with/without cues using equipment (OT) Flowsheets (Taken 02/24/2021 1244) LTG: Pt will perform grooming with assistance level of: Independent with assistive device    Problem: RH Bathing Goal: LTG Patient will bathe all body parts with assist levels (OT) Description: LTG: Patient will bathe all body parts with assist levels (OT) Flowsheets (Taken 02/24/2021 1244) LTG: Pt will perform bathing with assistance level/cueing: Independent with assistive device    Problem: RH Dressing Goal: LTG Patient will perform lower body dressing w/assist (OT) Description: LTG: Patient will perform lower body dressing with assist, with/without cues in positioning using equipment (OT) Flowsheets (Taken 02/24/2021 1244) LTG: Pt will perform lower body dressing with assistance level of: Independent with assistive device   Problem: RH Toileting Goal: LTG Patient will perform toileting task (3/3 steps) with assistance level (OT) Description: LTG: Patient will perform toileting task (3/3 steps) with assistance level (OT)   Flowsheets (Taken 02/24/2021 1244) LTG: Pt will perform toileting task (3/3 steps) with assistance level: Independent with assistive device   Problem: RH Functional Use of Upper Extremity Goal: LTG Patient will use RT/LT upper extremity as a (OT) Description: LTG: Patient will use right/left upper extremity as a stabilizer/gross assist/diminished/nondominant/dominant level with assist, with/without cues during functional activity (OT) Flowsheets (Taken 02/24/2021 1244) LTG: Use of upper extremity in functional activities: LUE as dominant level LTG: Pt will use upper extremity in functional activity with assistance level of: Independent with assistive device   Problem: RH Simple Meal Prep Goal: LTG Patient will perform simple meal prep w/assist (OT) Description: LTG: Patient will perform simple meal prep with assistance, with/without cues (OT). Flowsheets (Taken 02/24/2021 1244) LTG: Pt will perform simple meal prep with assistance level of: Supervision/Verbal cueing   Problem: RH Light Housekeeping Goal: LTG Patient will perform light housekeeping w/assist (OT) Description: LTG: Patient will perform light housekeeping with assistance, with/without cues (OT). Flowsheets (Taken 02/24/2021 1244) LTG: Pt will perform light housekeeping with assistance level of: Supervision/Verbal cueing   Problem: RH Toilet Transfers Goal: LTG Patient will perform toilet transfers w/assist (OT) Description: LTG: Patient will perform toilet transfers with assist, with/without cues using equipment (OT) Flowsheets (Taken 02/24/2021 1244) LTG: Pt will perform toilet transfers with assistance level of: Independent with assistive device   Problem: RH Tub/Shower Transfers Goal: LTG Patient will perform tub/shower transfers w/assist (OT) Description: LTG: Patient will perform tub/shower transfers with assist, with/without cues using equipment (OT) Flowsheets (Taken 02/24/2021 1244) LTG: Pt will perform tub/shower stall  transfers with assistance level of: Independent with assistive device   Problem: RH Furniture Transfers Goal: LTG Patient will perform furniture transfers w/assist (OT/PT) Description: LTG: Patient will perform furniture transfers  with assistance (OT/PT). Flowsheets (  Taken 02/24/2021 1244) LTG: Pt will perform furniture transfers with assist:: Independent with assistive device

## 2021-02-24 NOTE — Evaluation (Signed)
Occupational Therapy Assessment and Plan  Patient Details  Name: Carlos Rice MRN: [id number] Date of Birth: 31-Oct-1970  OT Diagnosis: ataxia, hemiplegia affecting dominant side, hemiplegia affecting non-dominant side, muscle weakness (generalized), and decreased postural control, activity tolerance Rehab Potential: Rehab Potential (ACUTE ONLY): Good ELOS: 7-10 days   Today's Date: 02/24/2021 OT Individual Time: 0700-0808 OT Individual Time Calculation (min): 68 min     Hospital Problem: Principal Problem:   ICH (intracerebral hemorrhage) ([place])   Past Medical History:  Past Medical History:  Diagnosis Date   CKD (chronic kidney disease) stage 4, GFR 15-29 ml/min (HCC)    CVA (cerebral vascular accident) ([place])    High blood pressure    History of noncompliance with medical treatment    T2DM (type 2 diabetes mellitus) ([place])    Past Surgical History:  Past Surgical History:  Procedure Laterality Date   AMPUTATION TOE Right    great toe   AMPUTATION TOE Left    5th toe   DG GREAT TOE RIGHT FOOT     great toe     ROTATOR CUFF REPAIR Right 2018    Assessment & Plan Clinical Impression: Patient is a 51 y.o. year old male with history of T2DM, CKD, CVA, HTN who was admitted on 02/17/21 with fall out of his car with acute onset of left sided weakness with inability to stand. BP 250/146 on enroute to ED. CT head done revealing acute hemorrhage in right lentiform nucleus and external capsule with minimal surrounding edema. [place] felt to be hypertensive in nature and he was started on clevidipine drip for BP management w/ SBP goal 1<160.  Patient with history of medication refusal and has been using herbals for BP management. He was educated on non-pharmacologic measures to mange stress in addition to BP med per discussion with Dr. [name]. 2D echo not done as patient continues to decline this. BP improving with amlodipine on board. Therapy ongoing and patient limited by balance deficits with  ataxia and generalized weakness. CIR recommended due to functional decline. He feels that his elevated BP was precipitated by family stressors.  Patient transferred to CIR on 02/23/2021 .    Patient currently requires  min to mod A  with basic self-care skills and functional transfers  secondary to muscle weakness, decreased cardiorespiratoy endurance, impaired timing and sequencing, unbalanced muscle activation, ataxia, and decreased coordination, and decreased standing balance, decreased postural control, and decreased balance strategies  + chronic R hemi and new mild L hemiparesis. Prior to hospitalization, patient could complete BADL/IADL/mobility with independent .  Patient will benefit from skilled intervention to increase independence with basic self-care skills and increase level of independence with iADL prior to discharge  home with 110 yo son and PRN S from parents .  Anticipate patient will require intermittent supervision and follow up outpatient.  OT - End of Session Activity Tolerance: Tolerates 30+ min activity with multiple rests Endurance Deficit: Yes OT Assessment Rehab Potential (ACUTE ONLY): Good OT Barriers to Discharge: Decreased caregiver support;Insurance for SNF coverage OT Patient demonstrates impairments in the following area(s): Balance;Motor;Safety;Endurance;Vision OT Basic ADL's Functional Problem(s): Eating;Grooming;Bathing;Dressing;Toileting OT Advanced ADL's Functional Problem(s): Simple Meal Preparation;Light Housekeeping OT Transfers Functional Problem(s): Toilet;Tub/Shower OT Additional Impairment(s): Fuctional Use of Upper Extremity OT Plan OT Intensity: Minimum of 1-2 x/day, 45 to 90 minutes OT Frequency: 5 out of 7 days OT Duration/Estimated Length of Stay: 7-10 days OT Treatment/Interventions: Balance/vestibular training;Community reintegration;Disease mangement/prevention;Functional electrical stimulation;Neuromuscular re-education;Patient/family  education;Self Care/advanced ADL retraining;Splinting/orthotics;Therapeutic Exercise;UE/LE Coordination activities;Wheelchair  propulsion/positioning;Visual/perceptual remediation/compensation;UE/LE Strength taining/ROM;Therapeutic Activities;Psychosocial support;Pain management;Functional mobility training;DME/adaptive equipment instruction;Discharge planning;Cognitive remediation/compensation OT Self Feeding Anticipated Outcome(s): mod I OT Basic Self-Care Anticipated Outcome(s): mod I OT Toileting Anticipated Outcome(s): mod I OT Bathroom Transfers Anticipated Outcome(s): mod I OT Recommendation Patient destination: Home Follow Up Recommendations: Outpatient OT Equipment Recommended: To be determined   OT Evaluation Precautions/Restrictions  Precautions Precautions: Fall Precaution Comments: L lean in standing;  Chronic R sided Ataxia/hemi; right toe amputation Restrictions Weight Bearing Restrictions: No General Chart Reviewed: Yes Response to Previous Treatment: Not applicable Family/Caregiver Present: No  Pain Pain Assessment Pain Scale: 0-10 Pain Score: 0-No pain Home Living/Prior Functioning Home Living Family/patient expects to be discharged to:: Private residence Living Arrangements: Children (54 y.o son) Available Help at Discharge: Family, Available PRN/intermittently (parents live next door and are retired) Type of Home: Apartment Home Access: Level entry Home Layout: One level Bathroom Shower/Tub: Chiropodist: Standard  Lives With: Son (75 yo) IADL History Homemaking Responsibilities: Yes Meal Prep Responsibility: Primary Laundry Responsibility: Primary Cleaning Responsibility: Primary Bill Paying/Finance Responsibility: Primary Shopping Responsibility: Primary Child Care Responsibility: Primary Occupation: Full time employment Type of Occupation: desk work Prior Function Level of Independence: Independent with basic ADLs, Independent  with homemaking with ambulation, Independent with transfers, Independent with gait Driving: Yes Vocation: Full time employment Vision Baseline Vision/History: 1 Wears glasses ("for night driving") Ability to See in Adequate Light: 1 Impaired Patient Visual Report: Other (comment) ("ghosting" of images, images shifting momentarily) Vision Assessment?: Yes Eye Alignment: Within Functional Limits Ocular Range of Motion: Within Functional Limits Alignment/Gaze Preference: Within Defined Limits Tracking/Visual Pursuits: Able to track stimulus in all quads without difficulty Saccades: Within functional limits Convergence: Impaired (comment) Visual Fields: No apparent deficits Diplopia Assessment: Disappears with one eye closed Perception  Perception: Within Functional Limits Praxis Praxis: Impaired Praxis Impairment Details: Motor planning Cognition Overall Cognitive Status: Within Functional Limits for tasks assessed Arousal/Alertness: Awake/alert Orientation Level: Person;Place;Situation Person: Oriented Place: Oriented Situation: Oriented Year: 2023 Month: January Day of Week: Correct Memory: Appears intact Immediate Memory Recall: Sock;Blue;Bed Memory Recall Sock: Without Cue Memory Recall Blue: Without Cue Memory Recall Bed: Without Cue Attention: Alternating;Sustained Sustained Attention: Appears intact Awareness: Appears intact Problem Solving: Appears intact Safety/Judgment: Appears intact Comments: Good insight into deficits and need for self-correction/safety measures. Sensation Sensation Light Touch: Appears Intact Hot/Cold: Not tested Proprioception: Appears Intact Stereognosis: Appears Intact Additional Comments: Able to detect L lean in stance Coordination Gross Motor Movements are Fluid and Coordinated: No Fine Motor Movements are Fluid and Coordinated: No Coordination and Movement Description: ataxia on the R side UE>LE Finger Nose Finger Test: ataxia on  the R Heel Shin Test: grossly withing functional limits R vs L 9 Hole Peg Test: R: 1 min 39 seconds Motor  Motor Motor: Ataxia;Hemiplegia Motor - Skilled Clinical Observations: R sided hemiplegia and Bil ataxia R>L  Trunk/Postural Assessment  Cervical Assessment Cervical Assessment: Within Functional Limits Thoracic Assessment Thoracic Assessment: Within Functional Limits Lumbar Assessment Lumbar Assessment: Exceptions to [facility] (mild posterior pelvic tilt in sitting) Postural Control Postural Control: Deficits on evaluation (heavy reliance on BUE support in stance, L lean)  Balance Balance Balance Assessed: Yes Standardized Balance Assessment Standardized Balance Assessment: FIST;Timed Up and Go Test;Berg Balance Test Berg Balance Test Sit to Stand: Able to stand  independently using hands Standing Unsupported: Needs several tries to stand 30 seconds unsupported Sitting with Back Unsupported but Feet Supported on Floor or Stool: Able to sit safely and securely 2 minutes  Stand to Sit: Controls descent by using hands Transfers: Able to transfer with verbal cueing and /or supervision Standing Unsupported with Eyes Closed: Needs help to keep from falling Standing Ubsupported with Feet Together: Needs help to attain position and unable to hold for 15 seconds From Standing, Reach Forward with Outstretched Arm: Loses balance while trying/requires external support From Standing Position, Pick up Object from Floor: Unable to try/needs assist to keep balance From Standing Position, Turn to Look Behind Over each Shoulder: Needs supervision when turning Turn 360 Degrees: Needs assistance while turning Standing Unsupported, Alternately Place Feet on Step/Stool: Needs assistance to keep from falling or unable to try Standing Unsupported, One Foot in Front: Loses balance while stepping or standing Standing on One Leg: Unable to try or needs assist to prevent fall Total Score: 14 Function in  Sitting Test = FIST Anterior Nudge: superior sternum: Independent Posterior Nudge: between scapular spines: Independent Lateral Nudge: to dominant side at acromion: Independent Static Sitting: 30 seconds: Independent Sitting, Shake "No": left and right: Independent Sitting, Eyes Closed: 30 seconds: Independent Sitting, Lift Foot: dominant side, lift foot 1 inch twice: Independent Pick Up Object From Behind: object at midline, hands breadth posterior: Independent Forward Reach: use dominant arm, must complete full motion: Independent Lateral Reach: use dominant arm, clear opposite ischial tuberosity: Verbal cues/increased time Pick Up Object From Floor: from between feet: Independent Posterior Scooting: move backwards 2 inches: Independent Anterior Scooting: move forward 2 inches: Independent Lateral Scooting: move to dominent side 2 inches: Independent FIST TOTAL SCORE: 55 Timed Up and Go Test TUG: Normal TUG Normal TUG (seconds): 37 (with RW) Static Sitting Balance Static Sitting - Balance Support: Feet supported Static Sitting - Level of Assistance: 6: Modified independent (Device/Increase time) Dynamic Sitting Balance Dynamic Sitting - Balance Support: Feet supported;No upper extremity supported Dynamic Sitting - Level of Assistance: 5: Stand by assistance Static Standing Balance Static Standing - Balance Support: Bilateral upper extremity supported Static Standing - Level of Assistance: 4: Min assist Dynamic Standing Balance Dynamic Standing - Balance Support: Bilateral upper extremity supported;During functional activity Dynamic Standing - Level of Assistance: 4: Min assist;3: Mod assist Extremity/Trunk Assessment RUE Assessment RUE Assessment: Exceptions to [facility] Active Range of Motion (AROM) Comments: ~100 degrees shoulder flexion General Strength Comments: 4/5 in shoulder flexion, chronic mild R hemi with RUE ataxia; 53 lbs grip strength RUE Body System: Neuro Brunstrum  levels for arm and hand: Arm;Hand Brunstrum level for arm: Stage IV Movement is deviating from synergy Brunstrum level for hand: Stage V Independence from basic synergies LUE Assessment LUE Assessment: Within Functional Limits (grossly WFL for ADL) General Strength Comments: 5/5 in shoulder flexion, 47.5 lb grip strength  Care Tool Care Tool Self Care Eating    Set-up A    Oral Care    Oral Care Assist Level: Minimal Assistance - Patient > 75%    Bathing   Body parts bathed by patient: Right arm;Left arm;Chest;Abdomen;Front perineal area;Buttocks;Right upper leg;Left upper leg;Right lower leg;Left lower leg;Face     Assist Level: Minimal Assistance - Patient > 75%    Upper Body Dressing(including orthotics)   What is the patient wearing?: Pull over shirt   Assist Level: Independent    Lower Body Dressing (excluding footwear)   What is the patient wearing?: Pants Assist for lower body dressing: Minimal Assistance - Patient > 75%    Putting on/Taking off footwear   What is the patient wearing?: Shoes Assist for footwear: Independent with assistive device  Care Tool Toileting Toileting activity Toileting Activity did not occur (Clothing management and hygiene only): N/A (no void or bm)       Care Tool Bed Mobility Roll left and right activity        Sit to lying activity   Sit to lying assist level: Independent    Lying to sitting on side of bed activity   Lying to sitting on side of bed assist level: the ability to move from lying on the back to sitting on the side of the bed with no back support.: Independent     Care Tool Transfers Sit to stand transfer   Sit to stand assist level: Minimal Assistance - Patient > 75%    Chair/bed transfer   Chair/bed transfer assist level: Minimal Assistance - Patient > 75%     Toilet transfer   Assist Level: Minimal Assistance - Patient > 75%     Care Tool Cognition  Expression of Ideas and Wants Expression of  Ideas and Wants: 4. Without difficulty (complex and basic) - expresses complex messages without difficulty and with speech that is clear and easy to understand  Understanding Verbal and Non-Verbal Content Understanding Verbal and Non-Verbal Content: 4. Understands (complex and basic) - clear comprehension without cues or repetitions   Memory/Recall Ability Memory/Recall Ability : Current season;That he or she is in a hospital/hospital unit   Refer to Care Plan for [place] 1 OT Short Term Goal 1 (Week 1): STG = LTG 2/2 ELOS  Recommendations for other services: None    Skilled Therapeutic Intervention ADL ADL Eating: Set up Where Assessed-Eating: Edge of bed Grooming: Minimal assistance Where Assessed-Grooming: Standing at sink Upper Body Bathing: Supervision/safety Where Assessed-Upper Body Bathing: Shower Lower Body Bathing: Minimal assistance Where Assessed-Lower Body Bathing: Shower Upper Body Dressing: Independent Where Assessed-Upper Body Dressing: Edge of bed Lower Body Dressing: Minimal assistance Where Assessed-Lower Body Dressing: Edge of bed Toileting: Moderate assistance Where Assessed-Toileting: Glass blower/designer: Psychiatric nurse Method: Counselling psychologist: Raised toilet seat;Bedside commode;Grab bars Tub/Shower Transfer: Minimal assistance Clinical cytogeneticist Method: Optometrist: Facilities manager: Not assessed Mobility  Bed Mobility Bed Mobility: Sit to Supine;Supine to Sit Rolling Right: Supervision/verbal cueing Rolling Left: Supervision/Verbal cueing Supine to Sit: Independent Sit to Supine: Independent Transfers Sit to Stand: Minimal Assistance - Patient > 75%  Session Eval: Pt received awake semi-reclined in bed, no c/o pain and agreeable to OT eval. Reviewed role of CIR OT, evaluation process, ADL/func mobility retraining, goals for  therapy, and safety plan. Evaluation completed as documented above. Came to sitting EOB with independence. Completed stand-pivot > w/c on his L with min A due to L lean and for RW management. Pt already dressed for the day, was able to demonstrate doffing/donning B shoes with mod I (increased time.) Able to tie, as well. Total A w/c transport to and from gym for time management and energy conservation. Able to ambulate 3x10 ft with overall min A due to occasional L lean and for RW management. Simulated toilet transfer and TTB transfer at same level after verbal instructions. Administered 9HPT and assessed B grip strength with the above documented results. MD in/out morning assessment. Pt interested in further [facility] activities to target his chronic RUE ataxia, issued medium soft therapy and printed HEP and encouraged to complete with B hands. Educated on typical recovery post CVA. Pt left seated EOB with breakfast with bed alarm  engaged, call bell in reach, and all immediate needs met.  Discharge Criteria: Patient will be discharged from OT if patient refuses treatment 3 consecutive times without medical reason, if treatment goals not met, if there is a change in medical status, if patient makes no progress towards goals or if patient is discharged from hospital.  The above assessment, treatment plan, treatment alternatives and goals were discussed and mutually agreed upon: by patient  Volanda Napoleon MS, OTR/L  02/24/2021, 12:56 PM

## 2021-02-24 NOTE — Plan of Care (Signed)
°  Problem: RH Balance Goal: LTG Patient will maintain dynamic sitting balance (PT) Description: LTG:  Patient will maintain dynamic sitting balance with assistance during mobility activities (PT) Flowsheets (Taken 02/24/2021 1532) LTG: Pt will maintain dynamic sitting balance during mobility activities with:: Independent Goal: LTG Patient will maintain dynamic standing balance (PT) Description: LTG:  Patient will maintain dynamic standing balance with assistance during mobility activities (PT) Flowsheets (Taken 02/24/2021 1532) LTG: Pt will maintain dynamic standing balance during mobility activities with:: Supervision/Verbal cueing   Problem: RH Bed Mobility Goal: LTG Patient will perform bed mobility with assist (PT) Description: LTG: Patient will perform bed mobility with assistance, with/without cues (PT). Flowsheets (Taken 02/24/2021 1532) LTG: Pt will perform bed mobility with assistance level of: Independent   Problem: RH Bed to Chair Transfers Goal: LTG Patient will perform bed/chair transfers w/assist (PT) Description: LTG: Patient will perform bed to chair transfers with assistance (PT). Flowsheets (Taken 02/24/2021 1532) LTG: Pt will perform Bed to Chair Transfers with assistance level: Independent with assistive device    Problem: RH Car Transfers Goal: LTG Patient will perform car transfers with assist (PT) Description: LTG: Patient will perform car transfers with assistance (PT). Flowsheets (Taken 02/24/2021 1532) LTG: Pt will perform car transfers with assist:: Supervision/Verbal cueing   Problem: RH Furniture Transfers Goal: LTG Patient will perform furniture transfers w/assist (OT/PT) Description: LTG: Patient will perform furniture transfers  with assistance (OT/PT). Flowsheets (Taken 02/24/2021 1532) LTG: Pt will perform furniture transfers with assist:: Independent with assistive device    Problem: RH Ambulation Goal: LTG Patient will ambulate in controlled environment  (PT) Description: LTG: Patient will ambulate in a controlled environment, # of feet with assistance (PT). Flowsheets (Taken 02/24/2021 1532) LTG: Pt will ambulate in controlled environ  assist needed:: Supervision/Verbal cueing LTG: Ambulation distance in controlled environment: 160ft with LRAD Goal: LTG Patient will ambulate in home environment (PT) Description: LTG: Patient will ambulate in home environment, # of feet with assistance (PT). Flowsheets (Taken 02/24/2021 1532) LTG: Pt will ambulate in home environ  assist needed:: Supervision/Verbal cueing LTG: Ambulation distance in home environment: 48ft with LRAD   Problem: RH Wheelchair Mobility Goal: LTG Patient will propel w/c in controlled environment (PT) Description: LTG: Patient will propel wheelchair in controlled environment, # of feet with assist (PT) Flowsheets (Taken 02/24/2021 1532) LTG: Pt will propel w/c in controlled environ  assist needed:: Independent with assistive device LTG: Propel w/c distance in controlled environment: 167ft

## 2021-02-25 NOTE — Progress Notes (Signed)
PROGRESS NOTE   Subjective/Complaints:   Pt reports stress is what caused his stroke, so doesn't think he needs to BP meds- doesn't believe in synthetic medications- only natural "meds".   Upset in bed so much- says isn't able to "keep the heart moving as a muscle- needs to keep moving"-    ROS:  Pt denies SOB, abd pain, CP, N/V/C/D, and vision changes   Objective:   No results found. Recent Labs    02/24/21 0557  WBC 5.0  HGB 12.6*  HCT 36.3*  PLT 275   Recent Labs    02/24/21 0557  NA 135  K 4.1  CL 103  CO2 25  GLUCOSE 112*  BUN 32*  CREATININE 5.13*  CALCIUM 9.0    Intake/Output Summary (Last 24 hours) at 02/25/2021 1041 Last data filed at 02/25/2021 7858 Gross per 24 hour  Intake 180 ml  Output 2025 ml  Net -1845 ml        Physical Exam: Vital Signs Blood pressure (!) 158/89, pulse 70, temperature 99.1 F (37.3 C), temperature source Oral, resp. rate 16, height 5\' 8"  (1.727 m), weight 80.2 kg, SpO2 96 %.     General: awake, alert, appropriate, sitting up in bed slightly; very unhappy  about his concerns NAD HENT: conjugate gaze; oropharynx moist CV: regular rate; no JVD Pulmonary: CTA B/L; no W/R/R- good air movement GI: soft, NT, ND, (+)BS Psychiatric: appropriate- irritable- perseverative Neurological: Ox3;  Skin: intact Neuro: Alert and oriented x3, Strength 5/5 throughout    except for left 4/5 handgrip. Sensation intact bilaterally.    Assessment/Plan: 1. Functional deficits which require 3+ hours per day of interdisciplinary therapy in a comprehensive inpatient rehab setting. Physiatrist is providing close team supervision and 24 hour management of active medical problems listed below. Physiatrist and rehab team continue to assess barriers to discharge/monitor patient progress toward functional and medical goals  Care Tool:  Bathing    Body parts bathed by patient: Right  arm, Left arm, Chest, Abdomen, Front perineal area, Buttocks, Right upper leg, Left upper leg, Right lower leg, Left lower leg, Face         Bathing assist Assist Level: Minimal Assistance - Patient > 75%     Upper Body Dressing/Undressing Upper body dressing   What is the patient wearing?: Pull over shirt    Upper body assist Assist Level: Independent with assistive device    Lower Body Dressing/Undressing Lower body dressing      What is the patient wearing?: Pants     Lower body assist Assist for lower body dressing: Minimal Assistance - Patient > 75%     Toileting Toileting Toileting Activity did not occur (Clothing management and hygiene only): N/A (no void or bm)  Toileting assist Assist for toileting: Minimal Assistance - Patient > 75%     Transfers Chair/bed transfer  Transfers assist     Chair/bed transfer assist level: Moderate Assistance - Patient 50 - 74%     Locomotion Ambulation   Ambulation assist      Assist level: Moderate Assistance - Patient 50 - 74% Assistive device: Hand held assist Max distance: 30   Walk 10  feet activity   Assist     Assist level: Moderate Assistance - Patient - 50 - 74% Assistive device: Hand held assist   Walk 50 feet activity   Assist Walk 50 feet with 2 turns activity did not occur: Safety/medical concerns         Walk 150 feet activity   Assist Walk 150 feet activity did not occur: Safety/medical concerns         Walk 10 feet on uneven surface  activity   Assist Walk 10 feet on uneven surfaces activity did not occur: Safety/medical concerns         Wheelchair     Assist Is the patient using a wheelchair?: Yes Type of Wheelchair: Manual    Wheelchair assist level: Minimal Assistance - Patient > 75% Max wheelchair distance: 118ft    Wheelchair 50 feet with 2 turns activity    Assist        Assist Level: Minimal Assistance - Patient > 75%   Wheelchair 150 feet  activity     Assist      Assist Level: Minimal Assistance - Patient > 75%   Blood pressure (!) 158/89, pulse 70, temperature 99.1 F (37.3 C), temperature source Oral, resp. rate 16, height 5\' 8"  (1.727 m), weight 80.2 kg, SpO2 96 %.  Medical Problem List and Plan: 1. Functional deficits secondary to acute hemorrhage of right lentiform and external capsule             -patient may shower             -ELOS/Goals: 10-14 days modI             Con't PT/OT- CIR- pt wants ot get OOB as much as possible- explained we need to do so with staff, but will ask nursing to help get him up more in between therapies.  2.  Antithrombotics: -DVT/anticoagulation:  Mechanical: Sequential compression devices, below knee Bilateral lower extremities             -antiplatelet therapy: N/A due to [place]  1/8- educated that SCDs are the only thing we have to reduce risk of DVT 3. Pain Management: tylenol prn.  4. Mood: LCSW to follow for evaluation and support. Team to provide ego support.              -antipsychotic agents: N/A 5. Neuropsych: This patient is capable of making decisions on his own behalf. 6. Skin/Wound Care: Routine pressure relief measures.  7. Fluids/Electrolytes/Nutrition: Monitor I/O. Check BMET in am  1/7- electrolytes look good- con't to monitor 8. HTN: Monitor BP TID--continue Norvasc 9. T2DM: Hgb A1C-6.8. CM diet with diet education by RD. --fasting BS 105-111 range. Will monitor BS ac/hs 1/8- slightly elevated, but overall good- con't regimen 10. Stage IV CKD: BUN/SCr 47/4.58 at admission.   1/7- Cr 5.13- overall stable over last few days- will monitor- BUN 32 11. H/o stroke: Has declined statin in the past. Discussed importance of minimizing stress.   1/8- pt feels stroke caused by stress as compared to BP issues- doesn't think needs BP meds, but willing to take for now. Con't regimen 12. Anemia of chronic disease?: Will recheck CBC in am.  75. Hyperlipidemia: Chol-251, LDL-131  and Trig-154.              --has refused statin in the past.  14. Medication non-compliance: Will limit medications to essential only.  15. Overweight BMI 26.88: encouraged his diet and exercise habits, prior weight loss.  I spent a total of 36 minutes in  pt's room today- >50% coordination of care-  discussing his concerns as documented above- about moving, and machines as well as well as concern about "synthetic medications".             LOS: 2 days A FACE TO FACE EVALUATION WAS PERFORMED  Carlos Rice 02/25/2021, 10:41 AM

## 2021-02-25 NOTE — Progress Notes (Signed)
Occupational Therapy Session Note  Patient Details  Name: Carlos Rice MRN: [id number] Date of Birth: May 03, 1970  Today's Date: 02/26/2021 OT Individual Time: 5170-0174 OT Individual Time Calculation (min): 61 min   Short Term Goals: Week 1:  OT Short Term Goal 1 (Week 1): STG = LTG 2/2 ELOS  Skilled Therapeutic Interventions/Progress Updates:    Pt greeted while sitting EOB, slept well and had no pain. Agreeable to engage in BADLs but very concerned about privacy. OT set him up for shower and then pt used the RW with CGA overall to the TTB, 1 instance of Min A for balance support due to Lt lateral LOB when crossing the threshold of bathroom. Pt then bathed at seated level with setup assistance, OT behind curtain to honor privacy. Pt reported that he was able to reach/wash "everything" without assistance. Pt donned his shorts using lateral leans while on the TTB and then used the RW to ambulate back into the room, completed oral care while standing at the sink with CGA and then he returned to EOB to complete grooming tasks and UB dressing. Discussed at length stress mgt interventions such as music listening, mindfulness meditation, and yoga. Pt very receptive to education. He remained EOB at close of session, all needs within reach and bed alarm set. Tx focus placed on ADL retraining, functional transfers, dynamic balance, and coordination.   Therapy Documentation Precautions:  Precautions Precautions: Fall Precaution Comments: L lean in standing;  Chronic R sided Ataxia/hemi; right toe amputation Restrictions Weight Bearing Restrictions: No ADL: ADL Eating: Set up Where Assessed-Eating: Edge of bed Grooming: Minimal assistance Where Assessed-Grooming: Standing at sink Upper Body Bathing: Supervision/safety Where Assessed-Upper Body Bathing: Shower Lower Body Bathing: Minimal assistance Where Assessed-Lower Body Bathing: Shower Upper Body Dressing: Independent Where Assessed-Upper  Body Dressing: Edge of bed Lower Body Dressing: Minimal assistance Where Assessed-Lower Body Dressing: Edge of bed Toileting: Moderate assistance Where Assessed-Toileting: Glass blower/designer: Psychiatric nurse Method: Counselling psychologist: Raised toilet seat, Bedside commode, Grab bars Tub/Shower Transfer: Minimal assistance Tub/Shower Transfer Method: Optometrist: Facilities manager: Not assessed   Therapy/Group: Individual Therapy  Adith Tejada A Odessie Polzin 02/26/2021, 12:33 PM

## 2021-02-26 LAB — BASIC METABOLIC PANEL
Anion gap: 9 (ref 5–15)
BUN: 27 mg/dL — ABNORMAL HIGH (ref 6–20)
CO2: 27 mmol/L (ref 22–32)
Calcium: 9 mg/dL (ref 8.9–10.3)
Chloride: 104 mmol/L (ref 98–111)
Creatinine, Ser: 4.86 mg/dL — ABNORMAL HIGH (ref 0.61–1.24)
GFR, Estimated: 14 mL/min — ABNORMAL LOW (ref >60)
Glucose, Bld: 122 mg/dL — ABNORMAL HIGH (ref 70–99)
Potassium: 4.4 mmol/L (ref 3.5–5.1)
Sodium: 140 mmol/L (ref 135–145)

## 2021-02-26 LAB — CBC
HCT: 36.2 % — ABNORMAL LOW (ref 39.0–52.0)
Hemoglobin: 12.3 g/dL — ABNORMAL LOW (ref 13.0–17.0)
MCH: 31.5 pg (ref 26.0–34.0)
MCHC: 34 g/dL (ref 30.0–36.0)
MCV: 92.8 fL (ref 80.0–100.0)
Platelets: 277 10*3/uL (ref 150–400)
RBC: 3.9 MIL/uL — ABNORMAL LOW (ref 4.22–5.81)
RDW: 11.8 % (ref 11.5–15.5)
WBC: 3.9 10*3/uL — ABNORMAL LOW (ref 4.0–10.5)
nRBC: 0 % (ref 0.0–0.2)

## 2021-02-26 MED ORDER — MAGNESIUM GLUCONATE 500 MG PO TABS
250.0000 mg | ORAL_TABLET | Freq: Two times a day (BID) | ORAL | Status: DC
  Administered 2021-02-26 – 2021-02-27 (×3): 250 mg via ORAL
  Filled 2021-02-26 (×4): qty 1
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
## 2021-02-26 NOTE — Progress Notes (Signed)
Physical Therapy Session Note  Patient Details  Name: Carlos Rice MRN: [id number] Date of Birth: [birth year]  Today's Date: 02/26/2021 PT Individual Time: 1128-1203 PT Individual Time Calculation (min): 35 min   Short Term Goals: Week 1:  PT Short Term Goal 1 (Week 1): STG=LTG due to ELOS  Skilled Therapeutic Interventions/Progress Updates:    Pt seated EOB on arrival and agreeable to therapy, No complaint of pain. Session focused on pt education and NMR for gait. Sit to stand to RW with CGA throughout session. Pt requires min A overall for static standing balance, several LOB with as much as mod A to recover. Pt ambulated to ortho gym with min A and occ LOB, mod A to recover. Once seated on mat table, pt had many questions about his condition and prognosis, which therapist attempted to answer. Pt noted that he has "ghosting" (double vision) that worsens in standing. Pt educated on balance mechanisms and interventions. Pt directed in standing reaching with intermittent UE support. Pt then directed in pre gait stepping with intermittent UE support. Discussed why pt feels it is more difficult to step L foot forward. Pt ambulated back to room with RW and using meditation technique to normalize gait pattern with incr step length and stance time BIL. Pt demoed fatigue at end of session with incr LOB. Pt returned to EOB and was left with all needs in reach and alarm active.   Therapy Documentation Precautions:  Precautions Precautions: Fall Precaution Comments: L lean in standing;  Chronic R sided Ataxia/hemi; right toe amputation Restrictions Weight Bearing Restrictions: No General:      Therapy/Group: Individual Therapy  Mickel Fuchs 02/26/2021, 12:20 PM

## 2021-02-26 NOTE — Progress Notes (Signed)
Physical Therapy Session Note  Patient Details  Name: Carlos Rice MRN: [id number] Date of Birth: [birth year]  Today's Date: 02/26/2021 PT Individual Time: 1415-1454 PT Individual Time Calculation (min): 39 min   Short Term Goals: Week 1:  PT Short Term Goal 1 (Week 1): STG=LTG due to ELOS  Skilled Therapeutic Interventions/Progress Updates:   Received pt supine in bed, pt agreeable to PT treatment, and denied any pain during session. Session with emphasis on functional mobility/transfers, dynamic standing balance/coordination, gait training, and improved endurance. Pt transferred supine<>sitting EOB from flat bed with supervision and transferred bed<>WC stand<>pivot without AD and min A. Pt performed WC mobility >339ft x 2 trials using BUE and supervision to/from dayroom with emphasis on cardiovascular endurance and UE strength. Sit<>stand with RW and light CGA and ambulated 178ft with RW and min A around nurses station. Pt with ataxic gait with extremely narrow BOS frequently clipping heels against each other and with occasional lateral LOB L>R but adamant on correcting himself. Pt requested to "get heart rate up" and performed BUE/LE strengthening on Nustep at workload 7 for 3.5 minutes decreasing to workload 5 for additional 6.5 minutes for a total of 864 steps with emphasis on cardiovascular endurance and reciprocal movement training with 2 rest breaks. Stand<>pivot Nustep<>WC without AD and min A and very appreciate of getting a "good workout". Pt transferred WC<>bed stand<>pivot without AD and min A and RN arrived to provide medications. Concluded session with pt sitting EOB with all needs within reach and bed alarm on.   Therapy Documentation Precautions:  Precautions Precautions: Fall Precaution Comments: L lean in standing;  Chronic R sided Ataxia/hemi; right toe amputation Restrictions Weight Bearing Restrictions: No  Therapy/Group: Individual Therapy Alfonse Alpers  PT, DPT   02/26/2021, 7:34 AM

## 2021-02-26 NOTE — Progress Notes (Signed)
PROGRESS NOTE   Subjective/Complaints: Discussed that his goal is to be able to walk to and from the bathroom on his own. If he can achieve this, he would like to discharge by Thursday. He would like to bring down his number of blood pressure medications to two per day  ROS: +impaired balance   Objective:   No results found. Recent Labs    02/24/21 0557 02/26/21 0551  WBC 5.0 3.9*  HGB 12.6* 12.3*  HCT 36.3* 36.2*  PLT 275 277   Recent Labs    02/24/21 0557 02/26/21 0551  NA 135 140  K 4.1 4.4  CL 103 104  CO2 25 27  GLUCOSE 112* 122*  BUN 32* 27*  CREATININE 5.13* 4.86*  CALCIUM 9.0 9.0    Intake/Output Summary (Last 24 hours) at 02/26/2021 1047 Last data filed at 02/26/2021 0730 Gross per 24 hour  Intake 120 ml  Output 400 ml  Net -280 ml        Physical Exam: Vital Signs Blood pressure (!) 152/80, pulse 76, temperature 98.6 F (37 C), temperature source Oral, resp. rate 16, height 5\' 8"  (1.727 m), weight 80.2 kg, SpO2 98 %. Gen: no distress, normal appearing HEENT: oral mucosa pink and moist, NCAT Cardio: Reg rate Chest: normal effort, normal rate of breathing Abd: soft, non-distended Ext: no edema Psych: pleasant, normal affect Skin: intact Neurological: Ox3;  Skin: intact Neuro: Alert and oriented x3, Strength 5/5 throughout    except for left 4/5 handgrip. Sensation intact bilaterally.    Assessment/Plan: 1. Functional deficits which require 3+ hours per day of interdisciplinary therapy in a comprehensive inpatient rehab setting. Physiatrist is providing close team supervision and 24 hour management of active medical problems listed below. Physiatrist and rehab team continue to assess barriers to discharge/monitor patient progress toward functional and medical goals  Care Tool:  Bathing    Body parts bathed by patient: Right arm, Left arm, Chest, Abdomen, Front perineal area, Buttocks,  Right upper leg, Left upper leg, Right lower leg, Left lower leg, Face         Bathing assist Assist Level: Set up assist     Upper Body Dressing/Undressing Upper body dressing   What is the patient wearing?: Pull over shirt    Upper body assist Assist Level: Independent    Lower Body Dressing/Undressing Lower body dressing      What is the patient wearing?: Pants     Lower body assist Assist for lower body dressing: Set up assist     Toileting Toileting Toileting Activity did not occur (Clothing management and hygiene only): N/A (no void or bm)  Toileting assist Assist for toileting: Minimal Assistance - Patient > 75%     Transfers Chair/bed transfer  Transfers assist     Chair/bed transfer assist level: Moderate Assistance - Patient 50 - 74%     Locomotion Ambulation   Ambulation assist      Assist level: Moderate Assistance - Patient 50 - 74% Assistive device: Hand held assist Max distance: 30   Walk 10 feet activity   Assist     Assist level: Moderate Assistance - Patient - 50 - 74%  Assistive device: Hand held assist   Walk 50 feet activity   Assist Walk 50 feet with 2 turns activity did not occur: Safety/medical concerns         Walk 150 feet activity   Assist Walk 150 feet activity did not occur: Safety/medical concerns         Walk 10 feet on uneven surface  activity   Assist Walk 10 feet on uneven surfaces activity did not occur: Safety/medical concerns         Wheelchair     Assist Is the patient using a wheelchair?: Yes Type of Wheelchair: Manual    Wheelchair assist level: Minimal Assistance - Patient > 75% Max wheelchair distance: 11ft    Wheelchair 50 feet with 2 turns activity    Assist        Assist Level: Minimal Assistance - Patient > 75%   Wheelchair 150 feet activity     Assist      Assist Level: Minimal Assistance - Patient > 75%   Blood pressure (!) 152/80, pulse 76,  temperature 98.6 F (37 C), temperature source Oral, resp. rate 16, height 5\' 8"  (1.727 m), weight 80.2 kg, SpO2 98 %.  Medical Problem List and Plan: 1. Functional deficits secondary to acute hemorrhage of right lentiform and external capsule             -patient may shower             -ELOS/Goals: 10-14 days modI             Con't PT/OT- CIR- pt wants ot get OOB as much as possible- explained we need to do so with staff, but will ask nursing to help get him up more in between therapies.   Sent message to team about allowing patient to attempt standing balance alone.  2.  Antithrombotics: -DVT/anticoagulation:  Mechanical: Sequential compression devices, below knee Bilateral lower extremities             -antiplatelet therapy: N/A due to [place]             1/8- educated that SCDs are the only thing we have to reduce risk of DVT 3. Pain Management: tylenol prn.  4. Mood: LCSW to follow for evaluation and support. Team to provide ego support.              -antipsychotic agents: N/A 5. Neuropsych: This patient is capable of making decisions on his own behalf. 6. Skin/Wound Care: Routine pressure relief measures.  7. Fluids/Electrolytes/Nutrition: Monitor I/O. Check BMET in am             1/7- electrolytes look good- con't to monitor 8. HTN: Monitor BP TID--continue Norvasc. Increase magnesium to 250mg  BID 9. T2DM: Hgb A1C-6.8. CM diet with diet education by RD. 1/9: provided with list of foods to help diabetes.  10. Stage IV CKD: BUN/SCr 47/4.58 at admission.              1/7- Cr 5.13- overall stable over last few days- will monitor- BUN 32 11. H/o stroke: Has declined statin in the past. Discussed importance of minimizing stress.              1/8- pt feels stroke caused by stress as compared to BP issues- doesn't think needs BP meds, but willing to take for now. Con't regimen 12. Anemia of chronic disease?: Will recheck CBC in am.  13. Hyperlipidemia: Chol-251, LDL-131 and Trig-154.               --  has refused statin in the past.  14. Medication non-compliance: Will limit medications to essential only.  15. Overweight BMI 26.88: encouraged his diet and exercise habits, prior weight loss. Messaged team about incorporating 30 minute bike in today for cardiovascular exercise while mobility is limited due to balance.     LOS: 3 days A FACE TO FACE EVALUATION WAS PERFORMED  Carlos Rice Kinsie Belford 02/26/2021, 10:47 AM

## 2021-02-26 NOTE — Progress Notes (Signed)
Occupational Therapy Session Note  Patient Details  Name: Carlos Rice MRN: [id number] Date of Birth: [birth year]  Today's Date: 02/26/2021 OT Individual Time: 1300-1345 OT Individual Time Calculation (min): 45 min    Short Term Goals: Week 1:  OT Short Term Goal 1 (Week 1): STG = LTG 2/2 ELOS  Skilled Therapeutic Interventions/Progress Updates:    Pt received sitting up EOB with no c/o pain. Pt completed sit > stand with min A, core ataxia, no AD. He completed 5 ft of functional mobility without AD, requiring min-mod A. Using the RW he was able to complete ambulatory transfer to the therapy gym with CGA. He completed several therapeutic activities sit <> stand from the mat with focus on open chain movement, coordination, and strengthening. Pt given extra room to feel postural sway and attempt to self correct. Pt frequently requesting OT "not hold onto me" but OT informed pt that was absolutely unsafe and not an option for both OT and pt safety. He thoroughly enjoyed the challenge of these activities and completed several core stabilization with coordination challenge exercises in quadoped, with min A overall. Discussed what exercises were safe to do with and without supervision and with nursing. He returned to his room and was left sitting EOB with all needs met, bed alarm set.   Therapy Documentation Precautions:  Precautions Precautions: Fall Precaution Comments: L lean in standing;  Chronic R sided Ataxia/hemi; right toe amputation Restrictions Weight Bearing Restrictions: No   Therapy/Group: Individual Therapy  Curtis Sites 02/26/2021, 6:19 AM

## 2021-02-27 MED ORDER — ACETAMINOPHEN 325 MG PO TABS: 325.0000 mg | ORAL_TABLET | ORAL | Status: AC | PRN

## 2021-02-27 MED ORDER — MAGNESIUM GLUCONATE 500 MG PO TABS
500.0000 mg | ORAL_TABLET | Freq: Every day | ORAL | Status: DC
  Administered 2021-02-28 – 2021-03-02 (×3): 500 mg via ORAL
  Filled 2021-02-27 (×3): qty 1

## 2021-02-27 MED ORDER — HYDRALAZINE HCL 10 MG PO TABS
10.0000 mg | ORAL_TABLET | Freq: Once | ORAL | Status: AC
  Administered 2021-02-27: 10 mg via ORAL

## 2021-02-27 NOTE — Progress Notes (Signed)
PA Olin Hauser notified of pt B/P and that is has not decreased with PRN medication. Advised to give additional 10mg  of hydralazine now. Sheela Stack, LPN

## 2021-02-27 NOTE — Plan of Care (Signed)
Nutrition Education Note  RD consulted for nutrition diet education.  Lipid Panel     Component Value Date/Time   CHOL 251 (H) 02/18/2021 1500   TRIG 154 (H) 02/19/2021 0600   HDL 104 02/18/2021 1500   CHOLHDL 2.4 02/18/2021 1500   VLDL 16 02/18/2021 1500   LDLCALC 131 (H) 02/18/2021 1500   Reviewed patient's dietary recall. Pt reports he does not consume animal products and consume plant based protein, natural starches, and fruits, and vegetables. Pt reports following this diet over the past 4 years. Provided examples on ways to decrease sodium and fat intake in diet. Discouraged use of salt shaker. Encouraged fresh fruits and vegetables as well as whole grain sources of carbohydrates to maximize fiber intake. Teach back method used.  Expect good compliance.  Current diet order is heart healthy/carb modified, patient is consuming approximately 75-100% of meals at this time. Family has been bringing in foods from home/outside for pt to eat. Labs and medications reviewed. No further nutrition interventions warranted at this time. RD contact information provided. If additional nutrition issues arise, please re-consult RD.  Corrin Parker, MS, RD, LDN RD pager number/after hours weekend pager number on Amion.

## 2021-02-27 NOTE — Progress Notes (Signed)
Physical Therapy Session Note  Patient Details  Name: Carlos Rice MRN: [id number] Date of Birth: April 05, 1970  Today's Date: 02/27/2021 PT Individual Time: 1100-1158 PT Individual Time Calculation (min): 58 min   Short Term Goals: Week 1:  PT Short Term Goal 1 (Week 1): STG=LTG due to ELOS  Skilled Therapeutic Interventions/Progress Updates:   Received pt supine in bed, pt agreeable to PT treatment, and denied any pain during session. Session with emphasis on functional mobility/transfers, generalized strengthening, dynamic standing balance/coordination, gait training, NMR, and improved endurance with activity. Pt transferred supine<>sitting EOB with supervision and stand<>pivot bed<>WC without AD and min A. Pt performed WC mobility >381ft using BUE and supervision to dayroom with emphasis on cardiovascular endurance and UE strengthening. Sit<>stand with RW and CGA and pt ambulated 147ft with RW and CGA/light min A. Pt demonstrates improvements with ataxia with wider BOS and improved ability to recognize sudden onset of LOB and stopping and re-setting before continuing to ambulate. Pt transferred sit<>stand<>tall kneeling without AD and CGA/light min A and performed the following activities with emphasis on core stability: -alternating LE kickouts x10 bilaterally with CGA for stability -alternating shoulder flexion x10 bilaterally - decreased ROM RUE>LUE -alternating birddog arm/leg variation x5 bilaterally with min A for stability  - supine overhead shoulder flexion with 7.5lb dowel 2x20 second hold to stretch pecs (per pt request) -supine bridges with LEs on physioball 2x10 -supine bridges<>hamstring curl 1x15 -tall kneeling reverse nordic hamstring curls 2x12 - emphasis on glute activation -supine bridges<>alternating marches with 2.5lb ankle weight 2x10 bilaterally Pt requested to ambulate back to room with ankle weights on. Sit<>stand with RW and CGA and ambulated 135ft with RW and min A  with ankle weights - stopped due to fatigue and performed WC mobility ~278ft using BUE and supervision back to room. Stand<>pivot WC<>bed without AD and min A. Concluded session with pt sitting EOB, needs within reach, and bed alarm on.   Therapy Documentation Precautions:  Precautions Precautions: Fall Precaution Comments: L lean in standing;  Chronic R sided Ataxia/hemi; right toe amputation Restrictions Weight Bearing Restrictions: No  Therapy/Group: Individual Therapy Alfonse Alpers PT, DPT   02/27/2021, 7:25 AM

## 2021-02-27 NOTE — Progress Notes (Signed)
Physical Therapy Session Note  Patient Details  Name: Carlos Rice MRN: [id number] Date of Birth: 1970/08/30  Today's Date: 02/27/2021 PT Individual Time: 0805-0859 PT Individual Time Calculation (min): 54 min   Short Term Goals: Week 1:  PT Short Term Goal 1 (Week 1): STG=LTG due to ELOS  Skilled Therapeutic Interventions/Progress Updates:      Pt supine in bed on arrival. Requesting "5 minute" to "get ready" as he had just woken up. Allowed this time and reflected above in time calculation. Pt denies pain. Pt completed bed mobility mod I. Donned tennis shoes (including tieing them) without assist or cueing. Completed stand<>pivot transfer with minA from EOB to w/c, towards L side with VC for safety approach due to some mild impulsivity. Pt wheeled himself in w/c with supervision assist, `172ft, on level tile to main rehab gym.   While preparing to transfer to mat table, pt positioning himself directly in front of mat table - education on proper setup for transfers to reduce falls risk and he voiced understanding but would benefit from further practice to ensure carryover. Required minA for stand<>pivot transfer to mat table with truncal ataxia and LE ataxia impacting movement pattern. Pt requesting "hands off" approach during treatment session and allowed as much of this as possible with reinforcement on safety awareness and fall prevention.   Pt completed repeated sit<>stands for "active warm up" with CGA/minA overall - required VC for monitoring foot placement as L foot placed wide and anteriorly and when corrected, this improved his transfers and he was able to show more awareness during remainder of session. Provided large mirror for visual feedback when he completed toe taps 2x10 on L and then 2x10 on R in both forward/sideways directions with min/modA for balance without UE support. Increased difficulty on R > L which pt contributes to his R toe amputation but suspect more likely due to  CVA deficits. Due to posterior bias and weight on heels, introduced declined wedge where we practiced static standing without UE support, requiring min/modA for balance for truncal ataxia, L lean.   Pt assisted back to w/c with min/modA stand<>pivot transfer and pt propelled himself back to room, ~175ft, with supervision using BUE's. When returned to room, pt requesting to "walk" without assistance. Education again on fall prevention but permitted this with PT and SPT providing min/modA - ambulated ~3-79ft without AD, demonstrated wide BOS, BLE ataxia, inconsistent step lengths, and increased lateral leans L<>R. Pt requesting to end session seated EOB. Bed alarm on, all needs in reach, informed of upcoming therapy schedule.    Therapy Documentation Precautions:  Precautions Precautions: Fall Precaution Comments: L lean in standing;  Chronic R sided Ataxia/hemi; right toe amputation Restrictions Weight Bearing Restrictions: No General:    Therapy/Group: Individual Therapy  Carlos Rice 02/27/2021, 9:08 AM

## 2021-02-27 NOTE — Progress Notes (Signed)
Occupational Therapy Session Note  Patient Details  Name: Carlos Rice MRN: [id number] Date of Birth: 08-24-1970  Today's Date: 02/27/2021 OT Individual Time: 0903-1000 OT Individual Time Calculation (min): 57 min    Short Term Goals: Week 1:  OT Short Term Goal 1 (Week 1): STG = LTG 2/2 ELOS  Skilled Therapeutic Interventions/Progress Updates:  Skilled OT intervention completed with focus on DME education, activity tolerance, cardiovascular endurance, functional transfers, visual scanning, and safety awareness education. Pt received seated EOB with NT in room with pt agreeable to session. Therapist providing education about DME potentially to be recommended for home use for independence with self-care, with pt reporting he thinks a TTB would be beneficial, with education on places to purchase if interested in getting on own, with therapist recommending BSC for nighttime use at bedside but pt stating "you recommend that and I will refuse it" despite education about safety in middle of night with lethargy with transferring. Pt sit > stand with CGA, but pt insisting on not using RW, with very ataxic transfer stand pivot to w/c with min A to correct balance. Pt self-propelled in w/c <> therapy gym with supervision for BUE endurance. Then participated in BITS bell cancellation assessment to determine pt's visual scanning and field of vision. Pt required increased time to locate all bells with >8 mins in standing with CGA needed for dynamic balance while using RW. 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 Pt requiring initiation cues to use R hand during task vs L. Pt also completing large maze to promote [place] of RUE, with task upgraded to smaller spaced maze for increased challenge with [facility]. While seated EOM, pt participating in ball toss with medium sized ball to incorporate  [place] and bilateral integration into catching and tossing ball to therapist. Pt able to sit EOM with supervision, with cues needed to keep feet planted on floor for stability. Task upgraded to tossing and catching bean bag with just RUE, with pt demonstrating high motivation but min-mod difficulty with several dropping occurrences. Pt sit > stand without AD with CGA, then attempted stand pivot transfer to w/c with CGA, but pt requiring min A due to heavy L lean and ataxia, with pt trying to lean towards w/c vs pivoting with cues needed to correct. Transferred stand pivot with CGA using RW to EOB, then completed bed mobility with mod I. Pt left upright in bed with bed alarm on and all needs in reach at end of session.   Therapy Documentation Precautions:  Precautions Precautions: Fall Precaution Comments: L lean in standing;  Chronic R sided Ataxia/hemi; right toe amputation Restrictions Weight Bearing Restrictions: No  Pain: No c/o pain   Therapy/Group: Individual Therapy  Carlos Rice E Carlos Rice 02/27/2021, 7:32 AM

## 2021-02-27 NOTE — Progress Notes (Signed)
Physical Therapy Session Note  Patient Details  Name: Carlos Rice MRN: [id number] Date of Birth: Mar 27, 1970  Today's Date: 02/27/2021 PT Individual Time: 0865-7846; 1415-1440 PT Individual Time Calculation (min): 31 min and 25 min  Short Term Goals: Week 1:  PT Short Term Goal 1 (Week 1): STG=LTG due to ELOS  Skilled Therapeutic Interventions/Progress Updates:  Session 1 Pt received sitting EOB in room, denied pain and was agreeable to PT. Emphasis of session on immediate standing balance, posterior bias correction, core stability and BLE strength. Pt somewhat impulsive throughout session and required mod verbal cues to wait on therapist before attempting to move due to safety concerns. Pt performed sit <>stand to RW w/CGA and ambulated >150' from room to ortho gym w/RW and CGA-min A for occasional posterolateral LOB to L side. Noted scissoring of gait (RLE >LLE), ataxic gait pattern and variable gait speed resulting in difficulty managing RW and anterior LOB. Mod verbal cues to increase BOS and abduct BLEs to reduce scissoring as well as to slow down to reduce risk of anterior LOB. In main gym, pt requested to ambulated in // bars and ambulated down and back bars x5 w/BUE support on rails and S*. Pt requested to attempt to turn without BUE support, but lost balance posteriorly halfway through turn, requiring min A for stabilization.   On mat table, pt performed modified turkish get-ups w/3# dumbbell for improved core and shoulder stability, immediate standing balance, BLE strength and coordination. Pt performed 16 sit <>stands while maintaining 90 degrees of single arm flexion holding 3# dumbbell and instructed to maintain flexion throughout entirety of movement, 8 reps per side, CGA throughout. Pt demonstrated difficulty maintaining DB overhead and ensuring he kept B heels on the ground, as he has tendency to rise up on toes to compensate for posterior lean. Also noted pt leaning calves against  mat for added support. Min verbal cues to avoid leaning against mat table while standing and pt required min A for balance.   Pt ambulated >150' back to room w/ RW and green theraband tied around distal quads to provide tactile cue for BLE abduction and improve glute med strength, min A for posterolateral LOB to L and single anterior LOB due to poor RW management. Noted widened BOS and decreased scissoring with band until pt fatigued. Pt reported his "a** was burning off" and requested to sit. Pt transported back to room w/total A 2/2 fatigue and performed sit <>stand pivot from WC to EOB w/min A and no AD. Pt requested therapist come back if therapist had a cancellation in her schedule due to "loving the torture". Pt was left seated EOB, all needs in reach.   Session 2 Pt received supine in bed for unscheduled therapy time. Pt denied pain and was agreeable to PT. Emphasis of session on lateral hip stability and gait training. Pt performed bed mobility independently and sit <>stand pivot from EOB to [facility] w/min A and no AD. Pt self-propelled using BUEs and BLEs 20' to hallway and performed sit <>stand from [facility] to handrail w/CGA.   With green theraband tied around distal quads, pt performed lateral monster walks x15' to L and R side, 2 times each, for improved lateral hip stability, CGA throughout. Min verbal cues to maintain abduction of B feet and to challenge himself w/staying in mini squat position throughout for improved glute targeting. Noted increased difficulty leading w/LLE compared to leading w/RLE. Progressed to fwd and retro monster walks w/band, 2x15' each direction, min A  for steadying assist and mod verbal cues required for reduction of step length. Pt reported significant difficulty w/retro stepping and placing RLE properly.   Pt ambulated >150' to nurses station and back w/RW and CGA without band around quads. Noted widening of BOS, reduced scissoring of RLE and improved management of RW  compared to earlier session. Pt returned to bed and was left supine in bed, all needs in reach.   Therapy Documentation Precautions:  Precautions Precautions: Fall Precaution Comments: L lean in standing;  Chronic R sided Ataxia/hemi; right toe amputation Restrictions Weight Bearing Restrictions: No   Therapy/Group: Individual Therapy Cruzita Lederer Airen Dales, PT, DPT  02/27/2021, 7:54 AM

## 2021-02-27 NOTE — Progress Notes (Signed)
PROGRESS NOTE   Subjective/Complaints: Working with PT Improved standing tolerance Notes continued difficulties with his balance.  No new concerns from therapists  ROS: +impaired balance and coordination   Objective:   No results found. Recent Labs    02/26/21 0551  WBC 3.9*  HGB 12.3*  HCT 36.2*  PLT 277   Recent Labs    02/26/21 0551  NA 140  K 4.4  CL 104  CO2 27  GLUCOSE 122*  BUN 27*  CREATININE 4.86*  CALCIUM 9.0    Intake/Output Summary (Last 24 hours) at 02/27/2021 1042 Last data filed at 02/27/2021 0834 Gross per 24 hour  Intake --  Output 950 ml  Net -950 ml        Physical Exam: Vital Signs Blood pressure (!) 154/82, pulse 65, temperature 98.5 F (36.9 C), resp. rate 18, height 5\' 8"  (1.727 m), weight 80.2 kg, SpO2 100 %. Gen: no distress, normal appearing HEENT: oral mucosa pink and moist, NCAT Cardio: Reg rate Chest: normal effort, normal rate of breathing Abd: soft, non-distended Ext: no edema Psych: pleasant, normal affect Skin: intact Neurological: Ox3;  Skin: intact Neuro: Alert and oriented x3, Strength 5/5 throughout    except for left 4/5 handgrip. Sensation intact bilaterally.    Assessment/Plan: 1. Functional deficits which require 3+ hours per day of interdisciplinary therapy in a comprehensive inpatient rehab setting. Physiatrist is providing close team supervision and 24 hour management of active medical problems listed below. Physiatrist and rehab team continue to assess barriers to discharge/monitor patient progress toward functional and medical goals  Care Tool:  Bathing    Body parts bathed by patient: Right arm, Left arm, Chest, Abdomen, Front perineal area, Buttocks, Right upper leg, Left upper leg, Right lower leg, Left lower leg, Face         Bathing assist Assist Level: Set up assist     Upper Body Dressing/Undressing Upper body dressing   What is  the patient wearing?: Pull over shirt    Upper body assist Assist Level: Independent    Lower Body Dressing/Undressing Lower body dressing      What is the patient wearing?: Pants     Lower body assist Assist for lower body dressing: Set up assist     Toileting Toileting Toileting Activity did not occur (Clothing management and hygiene only): N/A (no void or bm)  Toileting assist Assist for toileting: Minimal Assistance - Patient > 75%     Transfers Chair/bed transfer  Transfers assist     Chair/bed transfer assist level: Minimal Assistance - Patient > 75%     Locomotion Ambulation   Ambulation assist      Assist level: Minimal Assistance - Patient > 75% Assistive device: Walker-rolling Max distance: 125ft   Walk 10 feet activity   Assist     Assist level: Minimal Assistance - Patient > 75% Assistive device: Walker-rolling   Walk 50 feet activity   Assist Walk 50 feet with 2 turns activity did not occur: Safety/medical concerns  Assist level: Minimal Assistance - Patient > 75% Assistive device: Walker-rolling    Walk 150 feet activity   Assist Walk 150 feet activity did not  occur: Safety/medical concerns  Assist level: Minimal Assistance - Patient > 75% Assistive device: Walker-rolling    Walk 10 feet on uneven surface  activity   Assist Walk 10 feet on uneven surfaces activity did not occur: Safety/medical concerns         Wheelchair     Assist Is the patient using a wheelchair?: Yes Type of Wheelchair: Manual    Wheelchair assist level: Supervision/Verbal cueing Max wheelchair distance: >316ft    Wheelchair 50 feet with 2 turns activity    Assist        Assist Level: Supervision/Verbal cueing   Wheelchair 150 feet activity     Assist      Assist Level: Supervision/Verbal cueing   Blood pressure (!) 154/82, pulse 65, temperature 98.5 F (36.9 C), resp. rate 18, height 5\' 8"  (1.727 m), weight 80.2 kg,  SpO2 100 %.  Medical Problem List and Plan: 1. Functional deficits secondary to acute hemorrhage of right lentiform and external capsule             -patient may shower             -ELOS/Goals: 10-14 days modI             Continue PT/OT- CIR- pt wants ot get OOB as much as possible- explained we need to do so with staff, but will ask nursing to help get him up more in between therapies.   Sent message to team about allowing patient to attempt standing balance alone.  2.  Antithrombotics: -DVT/anticoagulation:  Mechanical: Sequential compression devices, below knee Bilateral lower extremities             -antiplatelet therapy: N/A due to [place]             1/8- educated that SCDs are the only thing we have to reduce risk of DVT 3. Pain Management: tylenol prn.  4. Mood: LCSW to follow for evaluation and support. Team to provide ego support.              -antipsychotic agents: N/A 5. Neuropsych: This patient is capable of making decisions on his own behalf. 6. Skin/Wound Care: Routine pressure relief measures.  7. Fluids/Electrolytes/Nutrition: Monitor I/O.              1/7- electrolytes look good- con't to monitor 8. HTN: Monitor BP TID--continue Norvasc. Increase magnesium to 250mg  BID 9. T2DM: Hgb A1C-6.8. CM diet with diet education by RD. 1/9: provided with list of foods to help diabetes.  10. Stage IV CKD: BUN/SCr 47/4.58 at admission.              1/7- Cr 5.13- overall stable over last few days- will monitor- BUN 32 11. H/o stroke: Has declined statin in the past. Discussed importance of minimizing stress.              1/8- pt feels stroke caused by stress as compared to BP issues- doesn't think needs BP meds, but willing to take for now. Con't regimen 12. Anemia of chronic disease?: Will recheck CBC in am.  82. Hyperlipidemia: Chol-251, LDL-131 and Trig-154.              --has refused statin in the past.  14. Medication non-compliance: Will limit medications to essential only.  15.  Overweight BMI 26.88: encouraged his diet and exercise habits, prior weight loss. Messaged team about incorporating 30 minute bike in today for cardiovascular exercise while mobility is limited due to balance. Provided  list of foods that can help with weight loss. 16. Impaired balance: continue intensive PT/OT- messaged team to focus more on balance than ADLs    LOS: 4 days A FACE TO FACE EVALUATION WAS PERFORMED  Martha Clan P Venissa Nappi 02/27/2021, 10:42 AM

## 2021-02-27 NOTE — Discharge Instructions (Addendum)
Inpatient Rehab Discharge Instructions  Edmund Holcomb Discharge date and time:    Activities/Precautions/ Functional Status: Activity: no lifting, driving, or strenuous exercise till cleared by MD Diet: cardiac diet and diabetic diet Wound Care: none needed   Functional status:  ___ No restrictions     ___ Walk up steps independently ___ 24/7 supervision/assistance   ___ Walk up steps with assistance ___ Intermittent supervision/assistance  ___ Bathe/dress independently ___ Walk with walker     ___ Bathe/dress with assistance ___ Walk Independently    ___ Shower independently ___ Walk with assistance    ___ Shower with assistance _X__ No alcohol     ___ Return to work/school ________   Special Instructions:  No driving smoking or alcohol   COMMUNITY REFERRALS UPON DISCHARGE:    Outpatient: PT & OT             Agency:[facility] [address] [place] [address] Phone:[phone]              Appointment Date/Time:WILL CALL TO SET UP APPOINTMENTS  Medical Equipment/Items Ordered:ROLLING WALKER & TUB BENCH                                                 Agency/Supplier:[facility]   [phone]   STROKE/TIA DISCHARGE INSTRUCTIONS SMOKING Cigarette smoking nearly doubles your risk of having a stroke & is the single most alterable risk factor  If you smoke or have smoked in the last 12 months, you are advised to quit smoking for your health. Most of the excess cardiovascular risk related to smoking disappears within a year of stopping. Ask you doctor about anti-smoking medications [facility] Quit Line: 1-800-QUIT NOW Free Smoking Cessation Classes [phone]  CHOLESTEROL Know your levels; limit fat & cholesterol in your diet  Lipid Panel     Component Value Date/Time   CHOL 251 (H) 02/18/2021 1500   TRIG 154 (H) 02/19/2021 0600   HDL 104 02/18/2021 1500   CHOLHDL 2.4 02/18/2021 1500   VLDL 16 02/18/2021 1500   LDLCALC 131 (H) 02/18/2021 1500      Many patients benefit from treatment even if their cholesterol is at goal. Goal: Total Cholesterol (CHOL) less than 160 Goal:  Triglycerides (TRIG) less than 150 Goal:  HDL greater than 40 Goal:  LDL (LDLCALC) less than 100   BLOOD PRESSURE American Stroke Association blood pressure target is less that 120/80 mm/Hg  Your discharge blood pressure is:  BP: (!) 154/82 Monitor your blood pressure Limit your salt and alcohol intake Many individuals will require more than one medication for high blood pressure  DIABETES (A1c is a blood sugar average for last 3 months) Goal HGBA1c is under 7% (HBGA1c is blood sugar average for last 3 months)  Diabetes:     Lab Results  Component Value Date   HGBA1C 6.8 (H) 02/18/2021    Your HGBA1c can be lowered with medications, healthy diet, and exercise. Check your blood sugar as directed by your physician Call your physician if you experience unexplained or low blood sugars.  PHYSICAL ACTIVITY/REHABILITATION Goal is 30 minutes at least 4 days per week  Activity: No driving, Therapies:  Return to work: to be decided on follow up Activity decreases your risk of heart attack and stroke and makes your heart stronger.  It helps  control your weight and blood pressure; helps you relax and can improve your mood. Participate in a regular exercise program. Talk with your doctor about the best form of exercise for you (dancing, walking, swimming, cycling).  DIET/WEIGHT Goal is to maintain a healthy weight  Your discharge diet is:  Diet Order             Diet heart healthy/carb modified Room service appropriate? Yes; Fluid consistency: Thin  Diet effective now                   liquids Your height is:  Height: 5\' 8"  (172.7 cm) Your current weight is: Weight: 80.2 kg Your Body Mass Index (BMI) is:  BMI (Calculated): 26.89 Following the type of diet specifically designed for you will help prevent another stroke. Your goal weight range is:   Your  goal Body Mass Index (BMI) is 19-24. Healthy food habits can help reduce 3 risk factors for stroke:  High cholesterol, hypertension, and excess weight.  RESOURCES Stroke/Support Group:  Call [phone]   STROKE EDUCATION PROVIDED/REVIEWED AND GIVEN TO PATIENT Stroke warning signs and symptoms How to activate emergency medical system (call 911). Medications prescribed at discharge. Need for follow-up after discharge. Personal risk factors for stroke. Pneumonia vaccine given:  Flu vaccine given:  My questions have been answered, the writing is legible, and I understand these instructions.  I will adhere to these goals & educational materials that have been provided to me after my discharge from the hospital.      My questions have been answered and I understand these instructions. I will adhere to these goals and the provided educational materials after my discharge from the hospital.  Patient/Caregiver Signature _______________________________ Date __________  Clinician Signature _______________________________________ Date __________  Please bring this form and your medication list with you to all your follow-up doctor's appointments.

## 2021-02-28 MED ORDER — CLONIDINE HCL 0.1 MG/24HR TD PTWK
0.1000 mg | MEDICATED_PATCH | TRANSDERMAL | Status: DC
  Filled 2021-02-28: qty 1

## 2021-02-28 NOTE — Progress Notes (Signed)
Physical Therapy Session Note  Patient Details  Name: Carlos Rice MRN: [id number] Date of Birth: 07-28-1970  Today's Date: 02/28/2021 PT Individual Time: 8264-1583 and 0940-7680 PT Individual Time Calculation (min): 56 min and 41 min  Short Term Goals: Week 1:  PT Short Term Goal 1 (Week 1): STG=LTG due to ELOS  Skilled Therapeutic Interventions/Progress Updates:   Treatment Session 1 Received pt sitting EOB, pt agreeable to PT treatment, and denied any pain during session. Session with emphasis on functional mobility, generalized strengthening, dynamic standing balance, NMR, gait training, and endurance. Pt transferred sit<>stand with RW and CGA and ambulated 140ft x 2 trials with RW and CGA to/from main therapy gym - improvements in ataxia with wider BOS and overall less unsteadiness. Pt ambulated to staircase and navigated 24 steps with 2 rails and CGA/min A alternating ascending and descending with a step to and step through pattern - cues to ensure foot completley on ground prior to turning due to 1 LOB. Discussed D/C date with pt request to D/C Friday. Discussed recommendation to use RW initially upon D/C and to continue working in [place] to progress to cane - pt verbalized understanding and in agreement. Worked on blocked practice sit<>stands without AD from low mat height and holding 4.4lb medicine ball 4x10 with CGA for balance with emphasis on eccentric quad strength. Ambulated to // bars and worked on static standing balance on Rockerboard - pt able to maintain balance for ~10 seconds without UE support prior to loosing balance requiring min/mod A to correct. Transitioned to lateral taps on Rockerboard x5 reps bilaterally with BUE support - mod cues for sequencing/technique. MD present for morning rounds. Concluded session with pt sitting in [facility] with all needs within reach awaiting upcoming OT session.   Treatment Session 2 Received pt sitting EOB ready for therapy, pt agreeable to PT  treatment, and denied any pain during session. Session with emphasis on functional mobility, generalized strengthening, and endurance. NT present to check vitals and pt transferred bed<>WC stand<>pivot without AD and CGA - cues for turning technique and foot placement. Pt performed WC mobility >369ft using BUE and supervision to dayroom - challenged with sudden stops, sharp turns, 360 degree turns, and propelling backwards with emphasis on cardiovascular endurance. Pt able to set up transfer to Nustep with min cues (to get WC closer to Nustep prior to transferring) and transferred WC<>Nustep stand<>pivot without AD and min A. Pt performed BUE/LE strengthening on Nustep at workload 10 for 10 minutes for a total of 700 steps with emphasis on cardiovascular endurance - 1-2 brief rest breaks throughout. Pt transferred Nustep<>WC stand<>pivot without AD and min A and performed WC mobility >344ft using BUE and supervision back to room. Stand<>pivot WC<>bed without AD and min A. Concluded session with pt sitting EOB with all needs within reach.   Therapy Documentation Precautions:  Precautions Precautions: Fall Precaution Comments: L lean in standing;  Chronic R sided Ataxia/hemi; right toe amputation Restrictions Weight Bearing Restrictions: No  Therapy/Group: Individual Therapy Alfonse Alpers PT, DPT   02/28/2021, 7:28 AM

## 2021-02-28 NOTE — Progress Notes (Signed)
Occupational Therapy Discharge Summary  Patient Details  Name: Carlos Rice MRN: [id number] Date of Birth: Oct 10, 1970   Patient has met 8 of 10 long term goals due to improved activity tolerance, improved balance, postural control, functional use of  RIGHT upper extremity, improved attention, and improved coordination.  Patient to discharge at overall Modified Independent level.  Patient's 53 y.o. son is independent to provide the intermittent supervision assist with higher level tasks at discharge.    Reasons goals not met: higher level transfer goals not met due to ataxic movement with decreased postural control, as well as pt's decreased insight to deficits that required CGA with ambulation during functional tasks, with up to min A if pt lost balance which happened often  Recommendation:  Patient will benefit from ongoing skilled OT services in outpatient setting to continue to advance functional skills in the area of BADL and iADL.  Equipment: TTB  Reasons for discharge: treatment goals met and discharge from hospital  Patient/family agrees with progress made and goals achieved: Yes  OT Discharge Precautions/Restrictions  Precautions Precautions: Fall Precaution Comments: L lean in standing;  Chronic R sided Ataxia/hemi; right toe amputation Restrictions Weight Bearing Restrictions: No ADL ADL Eating: Independent Where Assessed-Eating: Edge of bed Grooming: Modified independent Where Assessed-Grooming: Edge of bed Upper Body Bathing: Modified independent Where Assessed-Upper Body Bathing: Shower Lower Body Bathing: Modified independent Where Assessed-Lower Body Bathing: Shower Upper Body Dressing: Independent Where Assessed-Upper Body Dressing: Edge of bed Lower Body Dressing: Modified independent Where Assessed-Lower Body Dressing: Edge of bed Toileting: Modified independent Where Assessed-Toileting: Risk analyst  Method: Counselling psychologist: Raised toilet seat, Bedside commode, Grab bars Tub/Shower Transfer: Modified independent Clinical cytogeneticist Method: Ambulating, Sit pivot Tub/Shower Equipment: Facilities manager: Not assessed Vision Baseline Vision/History: 1 Wears glasses Patient Visual Report: Diplopia Vision Assessment?: Yes Eye Alignment: Within Functional Limits Ocular Range of Motion: Within Functional Limits Alignment/Gaze Preference: Within Defined Limits Tracking/Visual Pursuits: Able to track stimulus in all quads without difficulty Saccades: Within functional limits Convergence: Impaired (comment) Visual Fields: No apparent deficits Diplopia Assessment: Disappears with one eye closed Perception  Perception: Within Functional Limits Praxis Praxis: Impaired Praxis Impairment Details: Motor planning Cognition Overall Cognitive Status: Within Functional Limits for tasks assessed Arousal/Alertness: Awake/alert Orientation Level: Oriented X4 Year: 2023 Month: January Day of Week: Correct Memory: Appears intact Immediate Memory Recall: Sock;Blue;Bed Memory Recall Sock: Without Cue Memory Recall Blue: Without Cue Memory Recall Bed: Without Cue Awareness: Impaired Problem Solving: Appears intact Safety/Judgment: Impaired Comments: slightly decreased insight into deficits; mild impulsivity Sensation Sensation Light Touch: Appears Intact Proprioception: Appears Intact Stereognosis: Appears Intact Additional Comments: Able to detect L lean in stance Coordination Gross Motor Movements are Fluid and Coordinated: No Fine Motor Movements are Fluid and Coordinated: No Coordination and Movement Description: ataxia on the R side UE>LE Finger Nose Finger Test: ataxia on the R Motor  Motor Motor: Ataxia;Hemiplegia Motor - Skilled Clinical Observations: mild incoordination due to truncal and LE ataxia, R hemiparesis, decreased standing  balance/coordination, and endurance deficits - improved since eval Mobility  Bed Mobility Bed Mobility: Rolling Right;Rolling Left;Supine to Sit;Sit to Supine Rolling Right: Independent Rolling Left: Independent Supine to Sit: Independent Sit to Supine: Independent Transfers Sit to Stand: Independent with assistive device Stand to Sit: Independent with assistive device  Trunk/Postural Assessment  Cervical Assessment Cervical Assessment: Within Functional Limits Thoracic Assessment Thoracic Assessment: Within Functional Limits Lumbar Assessment Lumbar Assessment: Within Functional Limits Postural  Control Postural Control: Deficits on evaluation Trunk Control: mild ataxia Protective Responses: delayed  Balance Static Sitting Balance Static Sitting - Balance Support: Feet supported;Bilateral upper extremity supported Static Sitting - Level of Assistance: 7: Independent Dynamic Sitting Balance Dynamic Sitting - Balance Support: Feet supported;No upper extremity supported Dynamic Sitting - Level of Assistance: 7: Independent Static Standing Balance Static Standing - Balance Support: Bilateral upper extremity supported (RW) Static Standing - Level of Assistance: 6: Modified independent (Device/Increase time) Dynamic Standing Balance Dynamic Standing - Balance Support: Bilateral upper extremity supported (RW) Dynamic Standing - Level of Assistance: 5: Stand by assistance (supervision) Dynamic Standing - Comments: with gait Extremity/Trunk Assessment RUE Assessment RUE Assessment: Exceptions to [facility] Active Range of Motion (AROM) Comments: ~100 degrees shoulder flexion General Strength Comments: 4/5 in shoulder flexion, chronic mild R hemi with RUE ataxia RUE Body System: Neuro Brunstrum levels for arm and hand: Arm;Hand Brunstrum level for arm: Stage IV Movement is deviating from synergy Brunstrum level for hand: Stage V Independence from basic synergies LUE Assessment LUE  Assessment: Within Functional Limits (grossly WFL for ADLs) General Strength Comments: 5/5 in shoulder flexion   Raahil Ong E Mahogony Gilchrest 02/28/2021, 7:41 PM

## 2021-02-28 NOTE — Progress Notes (Signed)
PROGRESS NOTE   Subjective/Complaints: Very happy with his progress with therapy- appreciates team working him hard Willing to take additional medication to get his blood pressure controlled  ROS: +impaired balance and coordination, +muscle soreness from workout   Objective:   No results found. Recent Labs    02/26/21 0551  WBC 3.9*  HGB 12.3*  HCT 36.2*  PLT 277   Recent Labs    02/26/21 0551  NA 140  K 4.4  CL 104  CO2 27  GLUCOSE 122*  BUN 27*  CREATININE 4.86*  CALCIUM 9.0    Intake/Output Summary (Last 24 hours) at 02/28/2021 0854 Last data filed at 02/28/2021 0755 Gross per 24 hour  Intake 240 ml  Output 700 ml  Net -460 ml        Physical Exam: Vital Signs Blood pressure (!) 157/101, pulse 84, temperature 98.2 F (36.8 C), temperature source Oral, resp. rate 14, height 5\' 8"  (1.727 m), weight 80.2 kg, SpO2 100 %. Gen: no distress, normal appearing HEENT: oral mucosa pink and moist, NCAT Cardio: Reg rate Chest: normal effort, normal rate of breathing Abd: soft, non-distended Ext: no edema Psych: pleasant, normal affect Skin: intact Neurological: Ox3;  Skin: intact Neuro: Alert and oriented x3, Strength 5/5 throughout   except for left 4/5 handgrip. Sensation intact bilaterally. Ambulating in parallel bars with impaired balance   Assessment/Plan: 1. Functional deficits which require 3+ hours per day of interdisciplinary therapy in a comprehensive inpatient rehab setting. Physiatrist is providing close team supervision and 24 hour management of active medical problems listed below. Physiatrist and rehab team continue to assess barriers to discharge/monitor patient progress toward functional and medical goals  Care Tool:  Bathing    Body parts bathed by patient: Right arm, Left arm, Chest, Abdomen, Front perineal area, Buttocks, Right upper leg, Left upper leg, Right lower leg, Left lower  leg, Face         Bathing assist Assist Level: Set up assist     Upper Body Dressing/Undressing Upper body dressing   What is the patient wearing?: Pull over shirt    Upper body assist Assist Level: Independent    Lower Body Dressing/Undressing Lower body dressing      What is the patient wearing?: Pants     Lower body assist Assist for lower body dressing: Set up assist     Toileting Toileting Toileting Activity did not occur (Clothing management and hygiene only): N/A (no void or bm)  Toileting assist Assist for toileting: Minimal Assistance - Patient > 75%     Transfers Chair/bed transfer  Transfers assist     Chair/bed transfer assist level: Minimal Assistance - Patient > 75%     Locomotion Ambulation   Ambulation assist      Assist level: Minimal Assistance - Patient > 75% Assistive device: Walker-rolling Max distance: 195ft   Walk 10 feet activity   Assist     Assist level: Minimal Assistance - Patient > 75% Assistive device: Walker-rolling   Walk 50 feet activity   Assist Walk 50 feet with 2 turns activity did not occur: Safety/medical concerns  Assist level: Minimal Assistance - Patient > 75% Assistive  device: Walker-rolling    Walk 150 feet activity   Assist Walk 150 feet activity did not occur: Safety/medical concerns  Assist level: Minimal Assistance - Patient > 75% Assistive device: Walker-rolling    Walk 10 feet on uneven surface  activity   Assist Walk 10 feet on uneven surfaces activity did not occur: Safety/medical concerns         Wheelchair     Assist Is the patient using a wheelchair?: Yes Type of Wheelchair: Manual    Wheelchair assist level: Supervision/Verbal cueing Max wheelchair distance: >329ft    Wheelchair 50 feet with 2 turns activity    Assist        Assist Level: Supervision/Verbal cueing   Wheelchair 150 feet activity     Assist      Assist Level: Supervision/Verbal  cueing   Blood pressure (!) 157/101, pulse 84, temperature 98.2 F (36.8 C), temperature source Oral, resp. rate 14, height 5\' 8"  (1.727 m), weight 80.2 kg, SpO2 100 %.  Medical Problem List and Plan: 1. Functional deficits secondary to acute hemorrhage of right lentiform and external capsule             -patient may shower             -ELOS/Goals: 10-14 days modI             Continue PT/OT- CIR- pt wants ot get OOB as much as possible- explained we need to do so with staff, but will ask nursing to help get him up more in between therapies.   Sent message to team about allowing patient to attempt standing balance alone.  2.  Antithrombotics: -DVT/anticoagulation:  Mechanical: Sequential compression devices, below knee Bilateral lower extremities             -antiplatelet therapy: N/A due to [place]             1/8- educated that SCDs are the only thing we have to reduce risk of DVT 3. Pain Management: tylenol prn.  4. Mood: LCSW to follow for evaluation and support. Team to provide ego support.              -antipsychotic agents: N/A 5. Neuropsych: This patient is capable of making decisions on his own behalf. 6. Skin/Wound Care: Routine pressure relief measures.  7. Fluids/Electrolytes/Nutrition: Monitor I/O.              1/7- electrolytes look good- con't to monitor 8. HTN: Monitor BP TID--continue Norvasc. Increase magnesium to 250mg  BID. Add clonidine patch.  9. T2DM: Hgb A1C-6.8. CM diet with diet education by RD. 1/9: provided with list of foods to help diabetes.  10. Stage IV CKD: improving, monitor creatinine as needed 11. H/o stroke: Has declined statin in the past. Discussed importance of minimizing stress.              1/8- pt feels stroke caused by stress as compared to BP issues- doesn't think needs BP meds, but willing to take for now. Con't regimen. Discussed stress reduction strategies, getting heat in his house today which should help 12. Anemia of chronic disease?: Will  recheck CBC in am.  53. Hyperlipidemia: Chol-251, LDL-131 and Trig-154.              --has refused statin in the past.  14. Medication non-compliance: Will limit medications to essential only.  15. Overweight BMI 26.88: encouraged his diet and exercise habits, prior weight loss. Messaged team about incorporating 30 minute bike  in today for cardiovascular exercise while mobility is limited due to balance. Provided list of foods that can help with weight loss. 16. Impaired balance: continue intensive PT/OT- messaged team to focus more on balance than ADLs    LOS: 5 days A FACE TO FACE EVALUATION WAS PERFORMED  Carlos Rice 02/28/2021, 8:54 AM

## 2021-02-28 NOTE — Patient Care Conference (Signed)
Inpatient RehabilitationTeam Conference and Plan of Care Update Date: 02/28/2021   Time: 11:35 AM    Patient Name: Carlos Rice      Medical Record Number: [id number]  Date of Birth: 04/14/1970 Sex: Male         Room/Bed: 4M06C/4M06C-01 Payor Info: Payor: [employer] MEDICARE / Plan: [employer] HMO / Product Type: *No Product type* /    Admit Date/Time:  02/23/2021  1:48 PM  Primary Diagnosis:  ICH (intracerebral hemorrhage) [facility])  Hospital Problems: Principal Problem:   ICH (intracerebral hemorrhage) ([place])    Expected Discharge Date: Expected Discharge Date: 03/02/21  Team Members Present: Physician leading conference: Dr. Leeroy Cha Social Worker Present: Erlene Quan, BSW Nurse Present: Dorien Chihuahua, RN PT Present: Becky Sax, PT OT Present: Other (comment) [facility] Alphonsa Gin, [place]) [place] Coordinator present : Gunnar Fusi, SLP     Current Status/Progress Goal Weekly Team Focus  Bowel/Bladder   cont of b/b LBM 1/10  remain cont.  assess q shift and prn   Swallow/Nutrition/ Hydration             ADL's   Supervision-set up A bathing, mod I UB dressing, supervision LB dressing, supervision toileting, min A functional transfers with/without AD  Supervision-mod I  funcitonal endurance, activity tolerance, ADL independence,   Mobility   bed moblity supervision, transfers with/without AD min A, gait 111ft with RW min A (ataxic), and supervision WC mobility >329ft  supervision/mod I  functional mobility/transfers, generalized strengthening, dynamic standing balance/coordination, gait training, NMR, endurance, and D/C planning.   Communication             Safety/Cognition/ Behavioral Observations            Pain   no complaints of pain  remain pain free  assess q shift and prn   Skin   abrasion left knee, healing  no new skin breakdown  assess q shift and prn     Discharge Planning:  discharging with mother and father. Min A , 24/7 supervision avaliable   Team  Discussion: Patient agreeable to BP medication adjustment; acknowledged CKD and medication limitations. Appreciative of consideration of aromatherapy and holistic treatment of issues. Patient's progress limited by impulsivity, decreased insight.  Patient on target to meet rehab goals: yes, currently requires supervision for transfers, with a device and CGA without a device due to bilateral loss of balance with activity. Completes toileting, bathing and dressing with supervision - mod I assist. Goals for discharge home with parents are supervision level overall.  *See Care Plan and progress notes for long and short-term goals.   Revisions to Treatment Plan:  Medication adjustment Dietician consult for food selection   Teaching Needs: Safety, medications, secondary risk management, transfers, etc.  Current Barriers to Discharge: None noted  Possible Resolutions to Barriers: Family education OP follow up services set up DME: walker and TTB     Medical Summary Current Status: impulsivity, decreased insight into deficits, uncontrolled blood pressure, overweight BMI 26.88  Barriers to Discharge: Medical stability  Barriers to Discharge Comments: impulsivity, decreased insight into deficits, uncontrolled blood pressure, overweight BMI 26.88 Possible Resolutions to [employer] Focus: continue amlodipine, start clonidine patch, consulted pharamcy to discuss risks and benefits of clonidine patch vs. other blood pressure medications   Continued Need for Acute Rehabilitation Level of Care: The patient requires daily medical management by a physician with specialized training in physical medicine and rehabilitation for the following reasons: Direction of a multidisciplinary physical rehabilitation program to maximize functional independence :  Yes Medical management of patient stability for increased activity during participation in an intensive rehabilitation regime.: Yes Analysis of  laboratory values and/or radiology reports with any subsequent need for medication adjustment and/or medical intervention. : Yes   I attest that I was present, lead the team conference, and concur with the assessment and plan of the team.   Dorien Chihuahua B 02/28/2021, 12:02 PM

## 2021-02-28 NOTE — Progress Notes (Addendum)
Patient ID: Carlos Rice, male   DOB: [birth year], 51 y.o.   MRN: [id number] Team Conference Report to Patient/Family  Team Conference discussion was reviewed with the patient and caregiver, including goals, any changes in plan of care and target discharge date.  Patient and caregiver express understanding and are in agreement.  The patient has a target discharge date of 03/02/21.  SW spoke with patient, provided conference updates. Patient pleased with d/c date. Patient requesting rolling walker and TTB. Also requesting OP referral. Patient reports the therapy is great ans pushes his limits. He appreciates everyone.   Dyanne Iha 02/28/2021, 1:07 PM

## 2021-02-28 NOTE — Progress Notes (Signed)
Occupational Therapy Session Note  Patient Details  Name: Carlos Rice MRN: [id number] Date of Birth: 1970/08/08  Today's Date: 02/28/2021 OT Individual Time: 0920-1030 & 1303-1330 OT Individual Time Calculation (min): 70 min & 27 min   Short Term Goals: Week 1:  OT Short Term Goal 1 (Week 1): STG = LTG 2/2 ELOS  Skilled Therapeutic Interventions/Progress Updates:  Session 1 Skilled OT intervention completed with focus on kitchen and household management education and practice, functional ambulation and endurance. Pt received seated in w/c, agreeable to session. Pt declining wanting to complete any self-care tasks. Pt self-propelled in w/c for BUE/cardiovascular endurance needed for self-care tasks, to ADL apartment with supervision. Pt functionally ambulated using RW through apartment and kitchen with CGA-supervision assist while using RW, to demonstrate safety, promote endurance/dynamic balance during bed making, and meal prep/washing dishes tasks. Pt reporting increased fatigue/soreness in BLEs from PT session yesterday, with intermittent seated rest breaks required, however pt self-initiating the need to rest, demonstrating good safety awareness. Pt required safety cues for bending to reach low items, as well as education provided on decreasing risk of falling by keeping both hands on RW vs holding item in one hand and walking, as well as using seated position for energy conservation and safety with higher level tasks like cutting fruit. Issued pt a RW bag due to pt's goal of mod I once d/c with pt able to teach back and demonstrate uses of bag during all tasks performed. Pt still demonstrating mild poor safety awareness with turning in walker due to ataxia, however pt only requiring CGA to correct balance and pt verbally expressing that he can feel when he needs to correct himself. Pt left seated EOB with LPN in room, bed alarm on and all needs in reach at end of session.  Session 2 Skilled  OT intervention completed with focus on shower transfers, shower safety education, and BUE endurance. Pt received seated EOB, agreeable to session. Pt completed sit > stand and stand pivot with RW to w/c with CGA. Pt self-propelled <> ADL bathroom, with supervision to promote BUE endurance needed for functional transfers. Therapist educated on sit pivot technique to TTB, with pt return demonstrating short ambulatory transfer from w/c into bathroom with CGA-supervision, then sit pivot onto tub bench with supervision. Cues provided for safety to prevent bottoming out, as well as education provided on not using suction grab bars for pulling, lateral leans for bottom care vs. standing in shower, and shower curtain management. Education provided on set up/assembly of TTB. Pt with 1 posterior LOB when going to sit in w/c, with min A needed to correct with education needed for slowing transfers down and using RW at all times not "trying without it" on his own. PT left seated EOB, with bed alarm on and all needs in reach at end of session.     Therapy Documentation Precautions:  Precautions Precautions: Fall Precaution Comments: L lean in standing;  Chronic R sided Ataxia/hemi; right toe amputation Restrictions Weight Bearing Restrictions: No  Pain: Unrated soreness in BLE, denied intervention needed but provided seated rest breaks   Therapy/Group: Individual Therapy  Audra Kagel E Lyncoln Maskell 02/28/2021, 7:34 AM

## 2021-03-01 ENCOUNTER — Telehealth: Payer: Self-pay | Admitting: Physical Medicine and Rehabilitation

## 2021-03-01 ENCOUNTER — Other Ambulatory Visit (HOSPITAL_COMMUNITY): Payer: Self-pay

## 2021-03-01 MED ORDER — CLONIDINE 0.1 MG/24HR TD PTWK
0.1000 mg | MEDICATED_PATCH | TRANSDERMAL | 0 refills | Status: DC
  Filled 2021-03-01: qty 4, 28d supply, fill #0

## 2021-03-01 MED ORDER — AMLODIPINE BESYLATE 10 MG PO TABS
10.0000 mg | ORAL_TABLET | Freq: Every day | ORAL | 0 refills | Status: DC
  Filled 2021-03-01: qty 30, 30d supply, fill #0

## 2021-03-01 MED ORDER — MAGNESIUM OXIDE 400 MG PO TABS
400.0000 mg | ORAL_TABLET | Freq: Every day | ORAL | 0 refills | Status: AC
Start: 2021-03-01 — End: ?
  Filled 2021-03-01 (×2): qty 30, 30d supply, fill #0

## 2021-03-01 NOTE — Progress Notes (Signed)
PROGRESS NOTE   Subjective/Complaints: No new complaints this morning Happy with therapy Willing to try clonidine patch BP elevated today  ROS: +impaired balance and coordination-improving, +muscle soreness from workout   Objective:   No results found. No results for input(s): WBC, HGB, HCT, PLT in the last 72 hours.  No results for input(s): NA, K, CL, CO2, GLUCOSE, BUN, CREATININE, CALCIUM in the last 72 hours.   Intake/Output Summary (Last 24 hours) at 03/01/2021 1224 Last data filed at 02/28/2021 1859 Gross per 24 hour  Intake 708 ml  Output --  Net 708 ml        Physical Exam: Vital Signs Blood pressure (!) 162/92, pulse 70, temperature 98 F (36.7 C), temperature source Oral, resp. rate 16, height 5\' 8"  (1.727 m), weight 80.2 kg, SpO2 100 %. Gen: no distress, normal appearing HEENT: oral mucosa pink and moist, NCAT Cardio: Reg rate Chest: normal effort, normal rate of breathing Abd: soft, non-distended Ext: no edema Psych: pleasant, normal affect Skin: intact Neurological: Ox3;  Skin: intact Neuro: Alert and oriented x3, Strength 5/5 throughout   except for left 4/5 handgrip. Sensation intact bilaterally. Ambulating in parallel bars with impaired balance   Assessment/Plan: 1. Functional deficits which require 3+ hours per day of interdisciplinary therapy in a comprehensive inpatient rehab setting. Physiatrist is providing close team supervision and 24 hour management of active medical problems listed below. Physiatrist and rehab team continue to assess barriers to discharge/monitor patient progress toward functional and medical goals  Care Tool:  Bathing    Body parts bathed by patient: Right arm, Left arm, Chest, Abdomen, Front perineal area, Buttocks, Right upper leg, Left upper leg, Right lower leg, Left lower leg, Face         Bathing assist Assist Level: Set up assist     Upper Body  Dressing/Undressing Upper body dressing   What is the patient wearing?: Pull over shirt    Upper body assist Assist Level: Independent    Lower Body Dressing/Undressing Lower body dressing      What is the patient wearing?: Pants     Lower body assist Assist for lower body dressing: Set up assist     Toileting Toileting Toileting Activity did not occur (Clothing management and hygiene only): N/A (no void or bm)  Toileting assist Assist for toileting: Minimal Assistance - Patient > 75%     Transfers Chair/bed transfer  Transfers assist     Chair/bed transfer assist level: Supervision/Verbal cueing     Locomotion Ambulation   Ambulation assist      Assist level: Supervision/Verbal cueing Assistive device: Walker-rolling Max distance: 157ft   Walk 10 feet activity   Assist     Assist level: Supervision/Verbal cueing Assistive device: Walker-rolling   Walk 50 feet activity   Assist Walk 50 feet with 2 turns activity did not occur: Safety/medical concerns  Assist level: Supervision/Verbal cueing Assistive device: Walker-rolling    Walk 150 feet activity   Assist Walk 150 feet activity did not occur: Safety/medical concerns  Assist level: Supervision/Verbal cueing Assistive device: Walker-rolling    Walk 10 feet on uneven surface  activity   Assist Walk 10 feet  on uneven surfaces activity did not occur: Safety/medical concerns   Assist level: Contact Guard/Touching assist Assistive device: Walker-rolling   Wheelchair     Assist Is the patient using a wheelchair?: Yes Type of Wheelchair: Manual    Wheelchair assist level: Independent Max wheelchair distance: >320ft    Wheelchair 50 feet with 2 turns activity    Assist        Assist Level: Independent   Wheelchair 150 feet activity     Assist      Assist Level: Independent   Blood pressure (!) 162/92, pulse 70, temperature 98 F (36.7 C), temperature source  Oral, resp. rate 16, height 5\' 8"  (1.727 m), weight 80.2 kg, SpO2 100 %.  Medical Problem List and Plan: 1. Functional deficits secondary to acute hemorrhage of right lentiform and external capsule             -patient may shower             -ELOS/Goals: 10-14 days modI             Continue PT/OT- CIR- pt wants ot get OOB as much as possible- explained we need to do so with staff, but will ask nursing to help get him up more in between therapies.   Sent message to team about allowing patient to attempt standing balance alone.   Discussed stress management, special session today  HFU scheduled 2/13 2.  Impaired mobility: continue Sequential compression devices, below knee Bilateral lower extremities             -antiplatelet therapy: N/A due to [place]             1/8- educated that SCDs are the only thing we have to reduce risk of DVT 3. Pain Management: N/A.  4. Mood: LCSW to follow for evaluation and support. Team to provide ego support.              -antipsychotic agents: N/A 5. Neuropsych: This patient is capable of making decisions on his own behalf. 6. Skin/Wound Care: Routine pressure relief measures.  7. Fluids/Electrolytes/Nutrition: Monitor I/O.              1/7- electrolytes look good- con't to monitor 8. HTN: Monitor BP TID--continue Norvasc. Increase magnesium to 250mg  BID. Add clonidine patch. Discussed risks and benefits.  9. T2DM: Hgb A1C-6.8. CM diet with diet education by RD. 1/9: provided with list of foods to help diabetes.  10. Stage IV CKD: improving, monitor creatinine as needed 11. H/o stroke: Has declined statin in the past. Discussed importance of minimizing stress.              1/8- pt feels stroke caused by stress as compared to BP issues- doesn't think needs BP meds, but willing to take for now. Con't regimen. Discussed stress reduction strategies, getting heat in his house today which should help 12. Anemia of chronic disease?: Will recheck CBC in am.  23.  Hyperlipidemia: Chol-251, LDL-131 and Trig-154.              --has refused statin in the past.  14. Medication non-compliance: Will limit medications to essential only.  15. Overweight BMI 26.88: encouraged his diet and exercise habits, prior weight loss. Messaged team about incorporating 30 minute bike in today for cardiovascular exercise while mobility is limited due to balance. Provided list of foods that can help with weight loss. 16. Impaired balance: continue intensive PT/OT- messaged team to focus more on balance than  ADLs. Provided with note for return to work on Tuesday.     LOS: 6 days A FACE TO FACE EVALUATION WAS PERFORMED  Clide Deutscher Shakyia Bosso 03/01/2021, 12:24 PM

## 2021-03-01 NOTE — Progress Notes (Signed)
Patient ID: Carlos Rice, male   DOB: Jan 23, 1971, 51 y.o.   MRN: [id number]  Rolling Walker and TTB ordered through [place].

## 2021-03-01 NOTE — Progress Notes (Signed)
Physical Therapy Discharge Summary  Patient Details  Name: Carlos Rice MRN: [id number] Date of Birth: May 09, 1970  Patient has met 7 of 9 long term goals due to improved activity tolerance, improved balance, improved postural control, increased strength, ability to compensate for deficits, improved attention, improved awareness, and improved coordination.  Patient to discharge at an ambulatory level Supervision using RW. Patient's care partner is independent to provide the necessary physical assistance at discharge. Pt's family did not attend family education training due to pt's request to discharge as soon as possible.   Reasons goals not met: Pt did not meet Mod I transfer goals as pt continues to require supervision due to ataxia, decreased balance/postural control, decreased coordination, and decreased insight into deficits.    Recommendation:  Patient will benefit from ongoing skilled PT services in outpatient setting to continue to advance safe functional mobility, address ongoing impairments in transfers, generalized strengthening and endurance, dynamic standing balance/coordination, gait training, and to minimize fall risk.  Equipment: RW  Reasons for discharge: treatment goals met and discharge from hospital  Patient/family agrees with progress made and goals achieved: Yes  PT Discharge Precautions/Restrictions Precautions Precautions: Fall Precaution Comments: Chronic R sided Ataxia/hemi; right toe amputation Restrictions Weight Bearing Restrictions: No Pain Interference Pain Interference Pain Effect on Sleep: 0. Does not apply - I have not had any pain or hurting in the past 5 days Pain Interference with Therapy Activities: 0. Does not apply - I have not received rehabilitationtherapy in the past 5 days Pain Interference with Day-to-Day Activities: 1. Rarely or not at all Cognition Overall Cognitive Status: Within Functional Limits for tasks assessed Arousal/Alertness:  Awake/alert Orientation Level: Oriented X4 Memory: Appears intact Awareness: Impaired Problem Solving: Appears intact Safety/Judgment: Impaired Comments: slightly decreased insight into deficits; mild impulsivity Sensation Sensation Light Touch: Appears Intact Proprioception: Appears Intact Coordination Gross Motor Movements are Fluid and Coordinated: No Fine Motor Movements are Fluid and Coordinated: No Coordination and Movement Description: mild incoordination due to truncal and LE ataxia, R hemiparesis, decreased standing balance/coordination, and endurance deficits - improved since eval Finger Nose Finger Test: ataxia on the R, WFL on LUE Heel Shin Test: [facility] but slow Motor  Motor Motor: Ataxia;Hemiplegia Motor - Skilled Clinical Observations: mild incoordination due to truncal and LE ataxia, R hemiparesis, decreased standing balance/coordination, and endurance deficits - improved since eval  Mobility Bed Mobility Bed Mobility: Rolling Right;Rolling Left;Supine to Sit;Sit to Supine Rolling Right: Independent Rolling Left: Independent Supine to Sit: Independent Sit to Supine: Independent Transfers Transfers: Sit to Stand;Stand to Sit;Stand Pivot Transfers Sit to Stand: Independent with assistive device Stand to Sit: Independent with assistive device Stand Pivot Transfers: Supervision/Verbal cueing Stand Pivot Transfer Details: Verbal cues for safe use of DME/AE;Verbal cues for technique Stand Pivot Transfer Details (indicate cue type and reason): occasional cues for RW safety and stepping technique when turning Transfer (Assistive device): Rolling walker Locomotion  Gait Ambulation: Yes Gait Assistance: Supervision/Verbal cueing Gait Distance (Feet): 150 Feet Assistive device: Rolling walker Gait Assistance Details: Verbal cues for technique Gait Assistance Details: occasional cues to widen BOS and to "reset" when becoming unsteady Gait Gait: Yes Gait Pattern:  Impaired Gait Pattern: Step-through pattern;Ataxic;Lateral hip instability;Narrow base of support Gait velocity: varies from decreased<>normal<>too fast Stairs / Additional Locomotion Stairs: Yes Stairs Assistance: Minimal Assistance - Patient > 75% Stair Management Technique: Two rails Number of Stairs: 24 Height of Stairs: 6 Ramp: Contact Guard/touching assist (RW) Wheelchair Mobility Wheelchair Mobility: Yes Wheelchair Assistance: Independent with assistive device  Wheelchair Propulsion: Both upper extremities Wheelchair Parts Management: Supervision/cueing Distance: >348ft  Trunk/Postural Assessment  Cervical Assessment Cervical Assessment: Within Functional Limits Thoracic Assessment Thoracic Assessment: Within Functional Limits Lumbar Assessment Lumbar Assessment: Within Functional Limits Postural Control Postural Control: Deficits on evaluation Trunk Control: mild ataxia Protective Responses: delayed  Balance Balance Balance Assessed: Yes Static Sitting Balance Static Sitting - Balance Support: Feet supported;Bilateral upper extremity supported Static Sitting - Level of Assistance: 7: Independent Dynamic Sitting Balance Dynamic Sitting - Balance Support: Feet supported;No upper extremity supported Dynamic Sitting - Level of Assistance: 7: Independent Static Standing Balance Static Standing - Balance Support: Bilateral upper extremity supported (RW) Static Standing - Level of Assistance: 6: Modified independent (Device/Increase time) Dynamic Standing Balance Dynamic Standing - Balance Support: Bilateral upper extremity supported (RW) Dynamic Standing - Level of Assistance: 5: Stand by assistance (supervision) Dynamic Standing - Comments: with gait Extremity Assessment  RLE Assessment RLE Assessment: Within Functional Limits General Strength Comments: grossly 5/5 LLE Assessment LLE Assessment: Within Functional Limits General Strength Comments: 5/5  Alfonse Alpers PT, DPT  03/01/2021, 7:38 AM

## 2021-03-01 NOTE — Progress Notes (Signed)
Physical Therapy Session Note  Patient Details  Name: Carlos Rice MRN: [id number] Date of Birth: 09/30/1970  Today's Date: 03/01/2021 PT Individual Time: 2924-4628 and 6381-7711 PT Individual Time Calculation (min): 55 min and 27 min  Short Term Goals: Week 1:  PT Short Term Goal 1 (Week 1): STG=LTG due to ELOS  Skilled Therapeutic Interventions/Progress Updates:   Treatment Session 1 Received pt sitting EOB ready for PT and denied any pain during session. Session with emphasis on discharge planning, functional mobility, simulated car transfers, dynamic standing balance/coordination, NMR, gait training, and endurance. Pt able to perform stand<>pivot transfers with RW and very close supervision throughout session but able to perform sit<>stands mod I with RW. Performed WC mobility 155ft using BUE mod I to ortho gym and performed ambulatory simulated car transfer with RW and very close supervision with cues for safe entry. Pt ambulated 75ft on uneven surfaces (ramp) with RW and CGA and performed WC mobility additional 61ft using BUE mod I to main therapy gym. Pt performed the following activities on the [employer] with emphasis on weight shifting, trunk control, and dynamic standing balance/coordination: -weight shift training for 30 seconds on level static increasing to level 7 for 1.5 minutes with BUE support and CGA. -limits of stability on level static with 1UE support and CGA for 1 minute and 15 seconds -limits of stability on level 7 with BUE support for 50 seconds and CGA -catch baseball game on level 12 with BUE support and CGA for 3 minutes  Sit<>stand with RW mod I and pt ambulated 138ft with RW and very close supervision. Pt with unsteadiness (specifically when turning) and with 2 instances clipping heels together, but overall demonstrates significant improvements in ataxia. Pt performed WC mobility 117ft using BUE mod I back to room and ambulated 66ft without AD and heavy min A to bed with  1 LOB when turning to sit on bed - emphasized importance of using RW upon discharge. Concluded session with pt sitting EOB with all needs within reach.   Treatment Session 2 Received pt sitting EOB with family present, ready for PT. Pt denied any pain during session. Session with emphasis on functional mobility, dynamic standing balance/coordination, NMR, and endurance. Pt transferred bed<>WC stand<>pivot without AD and CGA and performed WC mobility 147ft x 2 trials using BUE mod I to/from ortho gym. Pt transferred WC<>mat and mat<>WC without AD and CGA - pt with continued ataxia and difficulty stepping feet. Sit<>stand<>squat<>chest press with 3lb dowel x10 reps with close supervision with 1-2 LOB posterior LOB but able to self-correct. Sit<>stand without AD and CGA and performed alternating toe taps to single cone 2x5 bilaterally with mod handled assist - pt with multiple instances of LOB in all directions due to generalized unsteadiness, but improved with repetition. Pt then performed standing overhead chest press with 5lb dowel 2x15 with emphasis on UE strength and control. Pt transferred WC<>bed stand<>pivot without AD and CGA. Concluded session with pt sitting EOB with all needs within reach and family present at bedside.   Therapy Documentation Precautions:  Precautions Precautions: Fall Precaution Comments: L lean in standing;  Chronic R sided Ataxia/hemi; right toe amputation Restrictions Weight Bearing Restrictions: No  Therapy/Group: Individual Therapy Alfonse Alpers PT, DPT   03/01/2021, 7:26 AM

## 2021-03-01 NOTE — Progress Notes (Signed)
Discussed the indication for BP meds with patient and what could happen if he did not take them. (Another stroke).  We discussed the two meds amlodipine and clonidine patch. Patient did not seem particularly interested in discussing the details of each med. He said he'd already agreed to take them and he would. I encouraged compliance.   Analese Sovine S. Alford Highland, PharmD, BCPS Clinical Staff Pharmacist [email or web address]

## 2021-03-01 NOTE — Progress Notes (Addendum)
Occupational Therapy Session Note  Patient Details  Name: Carlos Rice MRN: [id number] Date of Birth: 30-May-1970  Today's Date: 03/01/2021 OT Individual Time: 1000-1059 OT Individual Time Calculation (min): 59 min    Short Term Goals: Week 1:  OT Short Term Goal 1 (Week 1): STG = LTG 2/2 ELOS  Skilled Therapeutic Interventions/Progress Updates:  Pt greeted EOB  agreeable to OT intervention. Session focus on stress mgmt, coping strategies, functional mobility, dynamic standing balance and decreasing overall caregiver burden. Pt reports feeling overwhelmed with stress d/t current living situations, provided general education on stress mgmt and coping strategies as well coping strategies to manage stressors. Provided therapeutic use of self, and active listening during session. Pt reports being interested in mediation and holistic healing. Education provided on benefits of mediation when dealing with stressors/ new diagnosis and encouraged pt to ask for quiet time while in the hospital to allow time to cope and deal with stressors via healthy coping skills such as meditation. Recommended yoga in OP to facilitate improved balance and carryover pts outlook on holistic healing and mind body connection.    Pt completed stand pivot transfer from EOB>w/c with rw and supervision. Total A transport to gym for time mgmt. Pt completed various dynamic balance tasks on compliant cushion and rocker board where instructed to  completed table top BUE [facility] task to challenge dynamic balance with no UE support to facilitate independence with ADL participation. Pt completed tasks with overall MIN A d/t L lateral lean. Pt transported to group form w/c with total A where pt handed off for next session.                               Therapy Documentation Precautions:  Precautions Precautions: Fall Precaution Comments: Chronic R sided Ataxia/hemi; right toe amputation Restrictions Weight Bearing Restrictions:  No  Pain: no pain reported during session     Therapy/Group: Individual Therapy  Precious Haws 03/01/2021, 3:59 PM

## 2021-03-01 NOTE — Telephone Encounter (Signed)
Note given for work

## 2021-03-01 NOTE — Progress Notes (Signed)
Patient ID: Carlos Rice, male   DOB: Aug 09, 1970, 51 y.o.   MRN: [id number]  Referral sent to Neuro OP

## 2021-03-01 NOTE — Progress Notes (Signed)
Inpatient Rehabilitation Discharge Medication Review by a Pharmacist  A complete drug regimen review was completed for this patient to identify any potential clinically significant medication issues.  High Risk Drug Classes Is patient taking? Indication by Medication  Antipsychotic No   Anticoagulant No   Antibiotic No   Opioid No   Antiplatelet No   Hypoglycemics/insulin No   Vasoactive Medication Yes Amlodipine and Clonidine patch for HTN  Chemotherapy No   Other No      Clinically significant medication issues were identified that warrant physician communication and completion of prescribed/recommended actions by midnight of the next day:  No  Name of provider notified for urgent issues identified:   Provider Method of Notification:     Pharmacist comments:   Time spent performing this drug regimen review (minutes):  53min with discussion with patient  Carlos Rice, PharmD, BCPS Clinical Staff Pharmacist [email or web address] [name] 03/01/2021 12:26 PM

## 2021-03-01 NOTE — Progress Notes (Signed)
Occupational Therapy Session Note  Patient Details  Name: Arie Powell MRN: [id number] Date of Birth: 27-Jan-1971  Today's Date: 03/01/2021 OT Group Time: 1100-1200 OT Group Time Calculation (min): 60 min   Short Term Goals: Week 1:  OT Short Term Goal 1 (Week 1): STG = LTG 2/2 ELOS  Skilled Therapeutic Interventions/Progress Updates:  Pt participated in group session with a focus on stress mgmt, education on healthy coping strategies, and social interaction. Focus of session on providing coping strategies to manage new current level of function as a result of new diagnosis.  Session focus on breaking down stressors into daily hassles, major life stressors and life circumstances in an effort to allow pts to chunk their stressors into groups. Pt actively sharing stressors and contributing to group conversation. Provided active listening, emotional support and therapeutic use of self. Offered education on factors that protect [place] against stress such as daily uplifts, healthy coping strategies and protective factors. Encouraged all group members to make an effort to actively recall one event from their day that was a daily uplift in an effort to protect their mindset from stressors as well as sharing this information with their caregivers to facilitate improved caregiver communication and decrease overall burden of care.  Issued pt handouts on healthy coping strategies to implement into routine. Pt transported back to room by RT.  Therapy Documentation Precautions:  Precautions Precautions: Fall Precaution Comments: Chronic R sided Ataxia/hemi; right toe amputation Restrictions Weight Bearing Restrictions: No   Pain: no pain reported during session     Therapy/Group: Group Therapy  Precious Haws 03/01/2021, 12:23 PM

## 2021-03-02 NOTE — Progress Notes (Signed)
PROGRESS NOTE   Subjective/Complaints: No new complaints this morning He is very appreciative of care here Discussed his planned detox at home, follow-up appointments  ROS: +impaired balance and coordination-improving, +muscle soreness from workout, denies pain   Objective:   No results found. No results for input(s): WBC, HGB, HCT, PLT in the last 72 hours.  No results for input(s): NA, K, CL, CO2, GLUCOSE, BUN, CREATININE, CALCIUM in the last 72 hours.   Intake/Output Summary (Last 24 hours) at 03/02/2021 0953 Last data filed at 03/01/2021 2055 Gross per 24 hour  Intake 120 ml  Output 625 ml  Net -505 ml        Physical Exam: Vital Signs Blood pressure (!) 160/80, pulse 66, temperature 97.8 F (36.6 C), temperature source Oral, resp. rate 18, height 5\' 8"  (1.727 m), weight 80.2 kg, SpO2 100 %. Gen: no distress, normal appearing HEENT: oral mucosa pink and moist, NCAT Cardio: Reg rate Chest: normal effort, normal rate of breathing Abd: soft, non-distended Ext: no edema Psych: pleasant, normal affect Skin: intact Neurological: Ox3;  Skin: intact Neuro: Alert and oriented x3, Strength 5/5 throughout   except for left 4/5 handgrip. Sensation intact bilaterally. Ambulating in parallel bars with impaired balance   Assessment/Plan: 1. Functional deficits which require 3+ hours per day of interdisciplinary therapy in a comprehensive inpatient rehab setting. Physiatrist is providing close team supervision and 24 hour management of active medical problems listed below. Physiatrist and rehab team continue to assess barriers to discharge/monitor patient progress toward functional and medical goals  Care Tool:  Bathing    Body parts bathed by patient: Right arm, Left arm, Chest, Abdomen, Front perineal area, Buttocks, Right upper leg, Left upper leg, Right lower leg, Left lower leg, Face         Bathing assist  Assist Level: Independent with assistive device     Upper Body Dressing/Undressing Upper body dressing   What is the patient wearing?: Pull over shirt    Upper body assist Assist Level: Independent    Lower Body Dressing/Undressing Lower body dressing      What is the patient wearing?: Pants     Lower body assist Assist for lower body dressing: Independent with assitive device     Toileting Toileting Toileting Activity did not occur (Clothing management and hygiene only): N/A (no void or bm)  Toileting assist Assist for toileting: Independent with assistive device     Transfers Chair/bed transfer  Transfers assist     Chair/bed transfer assist level: Supervision/Verbal cueing     Locomotion Ambulation   Ambulation assist      Assist level: Supervision/Verbal cueing Assistive device: Walker-rolling Max distance: 172ft   Walk 10 feet activity   Assist     Assist level: Supervision/Verbal cueing Assistive device: Walker-rolling   Walk 50 feet activity   Assist Walk 50 feet with 2 turns activity did not occur: Safety/medical concerns  Assist level: Supervision/Verbal cueing Assistive device: Walker-rolling    Walk 150 feet activity   Assist Walk 150 feet activity did not occur: Safety/medical concerns  Assist level: Supervision/Verbal cueing Assistive device: Walker-rolling    Walk 10 feet on uneven surface  activity   Assist Walk 10 feet on uneven surfaces activity did not occur: Safety/medical concerns   Assist level: Contact Guard/Touching assist Assistive device: Walker-rolling   Wheelchair     Assist Is the patient using a wheelchair?: Yes Type of Wheelchair: Manual    Wheelchair assist level: Independent Max wheelchair distance: >359ft    Wheelchair 50 feet with 2 turns activity    Assist        Assist Level: Independent   Wheelchair 150 feet activity     Assist      Assist Level: Independent    Blood pressure (!) 160/80, pulse 66, temperature 97.8 F (36.6 C), temperature source Oral, resp. rate 18, height 5\' 8"  (1.727 m), weight 80.2 kg, SpO2 100 %.  Medical Problem List and Plan: 1. Functional deficits secondary to acute hemorrhage of right lentiform and external capsule             -patient may shower             -ELOS/Goals: 10-14 days modI             d/c home today  Sent message to team about allowing patient to attempt standing balance alone.   Discussed stress management, special session today  HFU scheduled 2/13 2.  Impaired mobility: continue Sequential compression devices, below knee Bilateral lower extremities             -antiplatelet therapy: N/A due to [place]             1/8- educated that SCDs are the only thing we have to reduce risk of DVT 3. Pain Management: N/A.  4. Mood: LCSW to follow for evaluation and support. Team to provide ego support.              -antipsychotic agents: N/A 5. Neuropsych: This patient is capable of making decisions on his own behalf. 6. Skin/Wound Care: Routine pressure relief measures.  7. Fluids/Electrolytes/Nutrition: Monitor I/O.              1/7- electrolytes look good- con't to monitor 8. HTN: Monitor BP TID--continue Norvasc. Increase magnesium to 250mg  BID. Add clonidine patch. Discussed risks and benefits. Discussed getting a home blood pressure machine.  9. T2DM: Hgb A1C-6.8. CM diet with diet education by RD. 1/9: provided with list of foods to help diabetes.  10. Stage IV CKD: improving, monitor creatinine as needed 11. H/o stroke: Has declined statin in the past. Discussed importance of minimizing stress.              1/8- pt feels stroke caused by stress as compared to BP issues- doesn't think needs BP meds, but willing to take for now. Con't regimen. Discussed stress reduction strategies, getting heat in his house today which should help 12. Anemia of chronic disease?: Will recheck CBC in am.  67. Hyperlipidemia:  Chol-251, LDL-131 and Trig-154.              --has refused statin in the past.  14. Medication non-compliance: Will limit medications to essential only.  15. Overweight BMI 26.88: encouraged his diet and exercise habits, prior weight loss. Messaged team about incorporating 30 minute bike in today for cardiovascular exercise while mobility is limited due to balance. Provided list of foods that can help with weight loss. Discussed foods groups to avoid.  16. Impaired balance: continue outpatient PT/OT- messaged team to focus more on balance than ADLs. Provided with note for return to work on Tuesday. Reaching  out to SW regarding tub bench   >30 minutes spent in discharge of patient including review of medications and follow-up appointments, physical examination, and in answering all patient's questions     LOS: 7 days A FACE TO FACE EVALUATION WAS [place] 03/02/2021, 9:53 AM

## 2021-03-02 NOTE — Progress Notes (Signed)
INPATIENT REHABILITATION DISCHARGE NOTE   Discharge instructions by: Linna Hoff PA-C  Verbalized understanding: yes  Skin care/Wound care healing? No wounds   Pain: 0/10  IV's: removed  Tubes/Drains: none  O2: room air   Safety instructions: given to family  Patient belongings: given to family   Discharged to: home  Discharged via: Wheelchair   Notes:

## 2021-03-02 NOTE — Progress Notes (Signed)
Inpatient Rehabilitation Care Coordinator Discharge Note   Patient Details  Name: Carlos Rice MRN: [id number] Date of Birth: [birth year]   Discharge location: Home  Length of Stay: 7 days  Discharge activity level: Mod I  Home/community participation: Parents  Patient response IR:[id number] Literacy - How often do you need to have someone help you when you read instructions, pamphlets, or other written material from your doctor or pharmacy?: Never  Patient response MG:QQPYPP Isolation - How often do you feel lonely or isolated from those around you?: Never  Services provided included: SW, Pharmacy, TR, CM, RN, SLP, OT, PT, MD, RD  Financial Services:  Financial Services Utilized: Private Insurance [employer]  Choices offered to/list presented to: pt  Follow-up services arranged:  Outpatient    Outpatient Servicies: [facility]      Patient response to transportation need: Is the patient able to respond to transportation needs?: Yes In the past 12 months, has lack of transportation kept you from medical appointments or from getting medications?: No In the past 12 months, has lack of transportation kept you from meetings, work, or from getting things needed for daily living?: No    Comments (or additional information):  Patient/Family verbalized understanding of follow-up arrangements:  Yes  Individual responsible for coordination of the follow-up plan: patient  Confirmed correct DME delivered: Dyanne Iha 03/02/2021    Dyanne Iha

## 2021-03-05 DIAGNOSIS — N189 Chronic kidney disease, unspecified: Secondary | ICD-10-CM

## 2021-03-05 DIAGNOSIS — E119 Type 2 diabetes mellitus without complications: Secondary | ICD-10-CM

## 2021-03-05 DIAGNOSIS — Z9114 Patient's other noncompliance with medication regimen: Secondary | ICD-10-CM

## 2021-03-05 DIAGNOSIS — D631 Anemia in chronic kidney disease: Secondary | ICD-10-CM

## 2021-03-05 DIAGNOSIS — I69398 Other sequelae of cerebral infarction: Secondary | ICD-10-CM

## 2021-03-05 DIAGNOSIS — H5340 Unspecified visual field defects: Secondary | ICD-10-CM
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
## 2021-03-06 ENCOUNTER — Other Ambulatory Visit: Payer: Self-pay

## 2021-03-06 ENCOUNTER — Encounter: Payer: Medicare HMO | Attending: Physical Medicine and Rehabilitation | Admitting: Physical Medicine and Rehabilitation

## 2021-03-06 DIAGNOSIS — I10 Essential (primary) hypertension: Secondary | ICD-10-CM | POA: Diagnosis not present

## 2021-03-06 DIAGNOSIS — I610 Nontraumatic intracerebral hemorrhage in hemisphere, subcortical: Secondary | ICD-10-CM

## 2021-03-06 NOTE — Progress Notes (Signed)
° °  Subjective:    Patient ID: Carlos Rice, male    DOB: [birth year], 51 y.o.   MRN: [id number]  HPI An audio/video tele-health visit is felt to be the most appropriate encounter for this patient at this time. This is a follow up tele-visit via phone. The patient is at home. MD is at office. Prior to scheduling this appointment, our staff discussed the limitations of evaluation and management by telemedicine and the availability of in-person appointments. The patient expressed understanding and agreed to proceed.   Carlos Rice is a 50 year old man presenting for hospital follow-up of ICH.  1) ICH -he starts outpatient therapy tomorrow -he hopes they work him as hard as he worked in inpatient rehab -he feels he is slowed down by his current walker and would prefer a rolling walker instead -he was able to bike 2 miles and his son felt this was too much.  -he has been able to take 5 steps without his walker  2) HTN -His BP has improved to 150s/90s    Review of Systems +impaired balance    Objective:   Physical Exam Not performed as seen via phone        Assessment & Plan:  1) ICH -prescribed rolling walker -continued therapy tomorrow -commended on bike riding and encouraged exercise  2) HTN -encouraged him to keep blood pressure log to share with me  5 minutes spent in discussing his blood pressure, 2 mile bike-ride, current ambulation with RW, starting therapy tomorrow.

## 2021-03-07 ENCOUNTER — Ambulatory Visit: Payer: Medicare HMO | Attending: Physician Assistant | Admitting: Occupational Therapy

## 2021-03-07 ENCOUNTER — Encounter: Payer: Self-pay | Admitting: Occupational Therapy

## 2021-03-07 ENCOUNTER — Ambulatory Visit: Payer: Medicare HMO

## 2021-03-07 ENCOUNTER — Other Ambulatory Visit: Payer: Self-pay

## 2021-03-07 VITALS — BP 162/82

## 2021-03-07 DIAGNOSIS — M6281 Muscle weakness (generalized): Secondary | ICD-10-CM | POA: Insufficient documentation

## 2021-03-07 DIAGNOSIS — R41842 Visuospatial deficit: Secondary | ICD-10-CM | POA: Insufficient documentation

## 2021-03-07 DIAGNOSIS — R2681 Unsteadiness on feet: Secondary | ICD-10-CM | POA: Diagnosis present

## 2021-03-07 DIAGNOSIS — R2689 Other abnormalities of gait and mobility: Secondary | ICD-10-CM | POA: Diagnosis present

## 2021-03-07 DIAGNOSIS — R278 Other lack of coordination: Secondary | ICD-10-CM | POA: Insufficient documentation

## 2021-03-07 DIAGNOSIS — R270 Ataxia, unspecified: Secondary | ICD-10-CM | POA: Insufficient documentation

## 2021-03-07 NOTE — Therapy (Signed)
[facility] [address] [place], [place], [address] Phone: [phone]   Fax:  [phone]  Occupational Therapy Evaluation  Patient Details  Name: Carlos Rice MRN: [id number] Date of Birth: October 28, 1970 Referring Provider (OT): Erenest Rasher f/u   Encounter Date: 03/07/2021   OT End of Session - 03/07/21 1406     Visit Number 1    Number of Visits 13    Date for OT Re-Evaluation 05/02/21    Authorization Type [employer]    Authorization Time Period $20 copay - Auth Req'd - [id number]    OT Start Time 1400    OT Stop Time 1445    OT Time Calculation (min) 45 min    Activity Tolerance Patient tolerated treatment well    Behavior During Therapy [facility] for tasks assessed/performed             Past Medical History:  Diagnosis Date   CKD (chronic kidney disease) stage 4, GFR 15-29 ml/min (HCC)    CVA (cerebral vascular accident) ([place])    High blood pressure    History of noncompliance with medical treatment    T2DM (type 2 diabetes mellitus) ([place])     Past Surgical History:  Procedure Laterality Date   AMPUTATION TOE Right    great toe   AMPUTATION TOE Left    5th toe   DG GREAT TOE RIGHT FOOT     great toe     ROTATOR CUFF REPAIR Right 2018    There were no vitals filed for this visit.   Subjective Assessment - 03/07/21 1448     Subjective  Pt is a 51 year old male that presents to Neuro OPOT. Pt with history of T2DM, CKD, CVA, HTN who was admitted on 02/17/21 with fall out of his car with acute onset of left sided weakness with inability to stand. BP 250/146 on enroute to ED. CT head done revealing acute hemorrhage in right lentiform nucleus and external capsule with minimal surrounding edema. [place] felt to be hypertensive in nature and he was started on clevidipine drip for BP management. Pt reports hating the walker and wanting to get a different one. Pt lives with son and 2 dogs in a 1st floor  apartment and has no difficulty accessing home or bathroom. Pt uses tub bench and 2 wheel rolling walker however reports not using the walker in the home much. Pt reports goals to "get off this walker and I just wanna walk" and then reported "I want to write" and "I want to walk around [facility]."    Pertinent History T2DM, CKD, CVA, HTN    Limitations Fall Risk. Ataxia RUE.    Patient Stated Goals "get off this walker and I just wanna walk" and then reported "I want to write" and "I want to walk around [facility]."    Currently in Pain? No/denies    Pain Score 0-No pain               [facility] OT Assessment - 03/07/21 1407       Assessment   Medical Diagnosis ICH    Referring Provider (OT) [place] f/u    Onset Date/Surgical Date 02/17/21   hx of previous CVAs. - date most recent   Hand Dominance Right    Prior Therapy Acute      Precautions   Precautions Fall      Balance Screen   Has the patient fallen in the  past 6 months No      Home  Environment   Family/patient expects to be discharged to: Private residence    [place]   + son, 2 dogs   Available Help at Discharge Family    Type of [place] Access Level entry    [place] One level    Bathroom Shower/Tub Tub/Shower unit    [place] - 2 wheels;Tub bench      Prior Function   Level of Independence Independent    Vocation Part time employment   continues to work from home   [employer] - claims    Leisure play with my dog      ADL   ADL comments Pt reports not needing any help with ADLs.      IADL   Prior Level of Function Shopping independent with all IADLs prior    Shopping Completely unable to shop    Light Housekeeping Needs help with all home maintenance tasks   mom is doing it now - usually takes to [place] now   Meal Prep Plans, prepares and serves adequate meals independently    [place] on family or friends for  transportation   pt reports he could and never was told not to   Medication Management Is responsible for taking medication in correct dosages at correct time    Physiological scientist financial matters independently (budgets, writes checks, pays rent, bills goes to bank), collects and keeps track of income      Mobility   Mobility Status Comments ambulates with rolling walker with close supervision      Written Expression   Dominant Hand Right      Vision - History   Baseline Vision Wears glasses for distance only   at night when i am driving   Additional Comments reports ghosting from previous strokes 2016      Vision Assessment   Comment pt reports the ghosting has gotten a little more since most recent CVA      Cognition   Overall Cognitive Status No family/caregiver present to determine baseline cognitive functioning      Observation/Other Assessments   Focus on Therapeutic Outcomes (FOTO)  was not captured at evaluation by front desk      Sensation   Light Touch Appears Intact    Hot/Cold Appears Intact      Coordination   Finger Nose Finger Test RUE impaired    9 Hole Peg Test Right;Left    Right 9 Hole Peg Test 84s    Left 9 Hole Peg Test 45.28s    Box and Blocks R 32, L 38    Other Ataxia in RUE      ROM / Strength   AROM / PROM / Strength AROM;Strength      AROM   Overall AROM  Within functional limits for tasks performed      Strength   Overall Strength Within functional limits for tasks performed    Overall Strength Comments RUE slightly weaker than LUE but functional      Hand Function   Right Hand Gross Grasp Functional    Right Hand Grip (lbs) 66.7    Left Hand Gross Grasp Functional    Left Hand Grip (lbs) 78  OT Short Term Goals - 03/07/21 1543       OT SHORT TERM GOAL #1   Title Pt will be independent with HEP for coordination and grip strength    Time 4    Period Weeks    Status  New    Target Date 04/04/21      OT SHORT TERM GOAL #2   Title Pt will demonstrate dynamic standing balance with no LOB in order to increase independence and safety with ADLs.    Time 4    Period Weeks    Status New      OT SHORT TERM GOAL #3   Title Pt will increase Box and Blocks score, Bilterally, by 5 blocks    Baseline R 32, L 38    Time 4    Period Weeks    Status New      OT SHORT TERM GOAL #4   Title Pt will demonstrate writing first and last name with 100% legibility with use of adapted strategies for ataxia    Time 4    Period Weeks    Status New               OT Long Term Goals - 03/07/21 1545       OT LONG TERM GOAL #1   Title Pt will be independent with any updated HEPs    Time 8    Period Weeks    Status New    Target Date 05/02/21      OT LONG TERM GOAL #2   Title Pt will increase coordination in RUE by completing 9 hole peg test in 75 seconds or less.    Baseline R 84s, L 45.28s    Time 8    Period Weeks    Status New      OT LONG TERM GOAL #3   Title Pt will increase legibiity of handwriting with use of strategies by writing 1-2 sentences with 100% legibility.    Time 8    Period Weeks    Status New      OT LONG TERM GOAL #4   Title Pt will verbalize understanding of adapted strategies for increasing independence and safety with ADLs and IADLs.    Time 8    Period Weeks    Status New      OT LONG TERM GOAL #5   Title Pt will increase grip strength in RUE by 5 lbs or greater.    Baseline R 66.7, L 78    Time 8    Period Weeks    Status New                   Plan - 03/07/21 1540     Clinical Impression Statement Pt is a 51 year old male that presents to Neuro OPOT. Pt with history of T2DM, CKD, CVA, HTN who was admitted on 02/17/21 with fall out of his car with acute onset of left sided weakness with inability to stand. BP 250/146 on enroute to ED. CT head done revealing acute hemorrhage in right lentiform nucleus and  external capsule with minimal surrounding edema. [place] felt to be hypertensive in nature and he was started on clevidipine drip for BP management. Pt presents today with RUE ataxia, poor coordination and decreased strength, unsteadiness on feet. Pt would benefit from skilled occupational therapy for targeting these listed areas of deficit and increase independence with ADLs and IADLs.    OT Occupational Profile  and History Detailed Assessment- Review of Records and additional review of physical, cognitive, psychosocial history related to current functional performance    Occupational performance deficits (Please refer to evaluation for details): IADL's;ADL's;Leisure;Work    Marketing executive / Function / Physical Skills ADL;Decreased knowledge of use of DME;Strength;Coordination;FMC;Flexibility;Mobility;ROM;IADL;Vision;Proprioception;UE functional use;GMC;Dexterity    Rehab Potential Good    Clinical Decision Making Several treatment options, min-mod task modification necessary    Comorbidities Affecting Occupational Performance: May have comorbidities impacting occupational performance    Modification or Assistance to Complete Evaluation  Min-Moderate modification of tasks or assist with assess necessary to complete eval    OT Frequency 2x / week    OT Duration 8 weeks   12 visits over 8 weeks d/t any scheduling conflicts.   OT Treatment/Interventions Self-care/ADL training;Splinting;DME and/or AE instruction;Fluidtherapy;Aquatic Therapy;Therapeutic activities;Therapeutic exercise;Manual Therapy;Patient/family education;Visual/perceptual remediation/compensation;Functional Mobility Training;Passive range of motion;Neuromuscular education    Plan Coordination HEP, standing balance -dynamic for increased independence with ADLs, RUE strategies for ataxia for handwriting    Consulted and Agree with Plan of Care Patient             Patient will benefit from skilled therapeutic intervention in order to  improve the following deficits and impairments:   Body Structure / Function / Physical Skills: ADL, Decreased knowledge of use of DME, Strength, Coordination, FMC, Flexibility, Mobility, ROM, IADL, Vision, Proprioception, UE functional use, GMC, Dexterity       Visit Diagnosis: Muscle weakness (generalized)  Other lack of coordination  Ataxia  Unsteadiness on feet  Visuospatial deficit    Problem List Patient Active Problem List   Diagnosis Date Noted   Anemia in chronic kidney disease 03/05/2021   Chronic kidney disease 03/05/2021   Diabetes ([place]) 03/05/2021   Non compliance w medication regimen 03/05/2021   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 [place], [place], [address] Phone: [phone]   Fax:  [phone]  Name: Adrain Nesbit MRN: [id number] Date of Birth: 10-19-1970

## 2021-03-08 ENCOUNTER — Encounter (HOSPITAL_BASED_OUTPATIENT_CLINIC_OR_DEPARTMENT_OTHER): Payer: Medicare HMO | Admitting: Physical Medicine and Rehabilitation

## 2021-03-08 DIAGNOSIS — I610 Nontraumatic intracerebral hemorrhage in hemisphere, subcortical: Secondary | ICD-10-CM | POA: Diagnosis not present

## 2021-03-08 DIAGNOSIS — I10 Essential (primary) hypertension: Secondary | ICD-10-CM

## 2021-03-08 NOTE — Therapy (Signed)
[facility] [address] [place], [place], [address] Phone: [phone]   Fax:  [phone]  Physical Therapy Evaluation  Patient Details  Name: Carlos Rice MRN: [id number] Date of Birth: 08-Sep-1970 Referring Provider (PT): Lauraine Rinne   Encounter Date: 03/07/2021   PT End of Session - 03/07/21 1539     Visit Number 1    Number of Visits 17    Date for PT Re-Evaluation 05/04/21    Authorization Type [employer] medicare so 10th visit progress note    PT Start Time 1533    PT Stop Time 1622    PT Time Calculation (min) 49 min    Equipment Utilized During Treatment Gait belt    Activity Tolerance Patient tolerated treatment well    Behavior During Therapy WFL for tasks assessed/performed             Past Medical History:  Diagnosis Date   CKD (chronic kidney disease) stage 4, GFR 15-29 ml/min (HCC)    CVA (cerebral vascular accident) ([place])    High blood pressure    History of noncompliance with medical treatment    T2DM (type 2 diabetes mellitus) ([place])     Past Surgical History:  Procedure Laterality Date   AMPUTATION TOE Right    great toe   AMPUTATION TOE Left    5th toe   DG GREAT TOE RIGHT FOOT     great toe     ROTATOR CUFF REPAIR Right 2018    Vitals:   03/07/21 1551  BP: (!) 162/82      Subjective Assessment - 03/07/21 1542     Subjective Pt has [place] on 02/17/21 and then went to inpatient rehab. Pt currently using RW and does not like it. Reports that his doctor has put an order in for 4WW but does not have yet. Pt reports prior to this stroke he was walking 1/4 mile with no device, stationary bike and treadmill at gym for 1/2 and hour. He would go to gym 3-5 days/week.    Pertinent History CVAx7. Did have rehab in [place] with prior but was not happy with it.  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    Currently in Pain? No/denies                [facility] PT Assessment - 03/07/21 1540       Assessment   Medical Diagnosis ICH    Referring Provider (PT) Lauraine Rinne    Onset Date/Surgical Date 02/17/21    Hand Dominance Right    Prior Therapy inpatient rehab      Precautions   Precautions Fall      Balance Screen   Has the patient fallen in the past 6 months Yes    How many times? 1    Has the patient had a decrease in activity level because of a fear of falling?  No    Is the patient reluctant to leave their home because of a fear of falling?  No      Home Environment   Living Environment Private residence    Living Arrangements Children   103 year old son   Available Help at Discharge Family    Type of [place] Access Level entry    [place] One level  [place] - 2 wheels;[place] - single point      Prior Function   Level of Independence Independent    Vocation Part time employment   continues to work from home   [employer] - claims    Leisure play with my dog      Cognition   Overall Cognitive Status Within Functional Limits for tasks assessed      Sensation   Light Touch Appears Intact      Coordination   Gross Motor Movements are Fluid and Coordinated No   heel/shin slightly affected   Fine Motor Movements are Fluid and Coordinated Yes   finger opposition intact but slightly slower on left   Heel Shin Test [facility] but slow bilateral      ROM / Strength   AROM / PROM / Strength Strength      Strength   Overall Strength Comments reports history of right RTC repair    Strength Assessment Site Shoulder;Elbow;Hand;Hip;Knee;Ankle    Right/Left Shoulder Right;Left    Right Shoulder Flexion 4+/5    Left Shoulder Flexion 5/5    Right/Left Elbow Right;Left    Right Elbow Flexion 5/5    Right Elbow Extension 5/5    Left Elbow Flexion 5/5    Left Elbow Extension 5/5    Right/Left hand  Right;Left    Right Hand Gross Grasp Functional    Left Hand Gross Grasp Functional    Right/Left Hip Right;Left    Right Hip Flexion 5/5    Right Hip ABduction 4+/5    Right Hip ADduction 4+/5    Left Hip Flexion 4+/5    Left Hip ABduction 4+/5    Left Hip ADduction 4+/5    Right/Left Knee Right;Left    Right Knee Flexion 4+/5    Right Knee Extension 5/5    Left Knee Flexion 4+/5    Left Knee Extension 5/5    Right/Left Ankle Right;Left    Right Ankle Dorsiflexion 5/5    Left Ankle Dorsiflexion 4+/5      Transfers   Transfers Sit to Stand;Stand to Sit    Sit to Stand 4: Min guard    Five time sit to stand comments  17.96 from chair without hands CGA with PT stabilizing chair as he tends to throw back. Multiple times to rise at times    Stand to Sit 4: Min guard    Comments Pt unsteady with rising with ataxic movements and losing balance posterior at times.      Ambulation/Gait   Ambulation/Gait Yes    Ambulation/Gait Assistance 4: Min guard;4: Min assist    Ambulation/Gait Assistance Details Assist to stabilize RW at times as pt tends to lose balance to left at times. Pt wanted to try rollator so trialed for 60' and he kept hands on brakes to control. Did fairly well but will need continued practice.    Ambulation Distance (Feet) 100 Feet    Assistive device Rolling walker;4-wheeled walker    Gait Pattern Step-through pattern;Ataxic    Ambulation Surface Level;Indoor    Gait velocity 28.76 sec=0.95m/s      Standardized Balance Assessment   Standardized Balance Assessment Berg Balance Test      Berg Balance Test   Sit to Stand Able to stand using hands after several tries    Standing Unsupported Able to stand 2 minutes with supervision    Sitting with Back Unsupported but Feet Supported on Floor or Stool Able to sit safely  and securely 2 minutes    Stand to Sit Controls descent by using hands    Transfers Able to transfer with verbal cueing and /or supervision    Standing  Unsupported with Eyes Closed Able to stand 10 seconds with supervision    Standing Unsupported with Feet Together Able to place feet together independently and stand for 1 minute with supervision    From Standing, Reach Forward with Outstretched Arm Can reach confidently >25 cm (10")    From Standing Position, Pick up Object from Floor Unable to pick up and needs supervision    From Standing Position, Turn to Look Behind Over each Shoulder Needs supervision when turning    Turn 360 Degrees Needs assistance while turning    Standing Unsupported, Alternately Place Feet on Step/Stool Able to complete >2 steps/needs minimal assist    Standing Unsupported, One Foot in [employer] balance while stepping or standing    Standing on One Leg Tries to lift leg/unable to hold 3 seconds but remains standing independently    Total Score 28                        Objective measurements completed on examination: See above findings.                PT Education - 03/08/21 0820     Education Details PT plan of care.    Person(s) Educated Patient    Methods Explanation    Comprehension Verbalized understanding              PT Short Term Goals - 03/08/21 1218       PT SHORT TERM GOAL #1   Title Pt will be able to perform initial HEP for strengthening and balance on own.    Time 4    Period Weeks    Status New    Target Date 04/05/21      PT SHORT TERM GOAL #2   Title Pt will decrease 5 x sit to stand from 17.96 sec to<15 sec with improved control for improved balance and functional strength.    Baseline 03/07/21 17.96 sec without hands from chair    Time 4    Period Weeks    Status New    Target Date 04/05/21      PT SHORT TERM GOAL #3   Title Pt will be able to ambulate 400' on level surfaces with walker mod I for improved household mobility and short community distances.    Time 4    Period Weeks    Status New    Target Date 04/05/21      PT SHORT TERM GOAL  #4   Title Pt will increase Berg from 28/56 to >34/56 for improved balance.    Baseline 03/07/21 28/56    Time 4    Period Weeks    Status New    Target Date 04/05/21               PT Long Term Goals - 03/08/21 1220       PT LONG TERM GOAL #1   Title Pt will be independent with progressive HEP for strengthening, balance and aerobic activity to continue gains on own.    Time 8    Period Weeks    Status New    Target Date 05/01/21      PT LONG TERM GOAL #2   Title Pt will increase gait speed to >0.48m/s for  improved gait safety.    Baseline 03/07/21 0.62m/s    Time 8    Period Weeks    Status New    Target Date 05/04/21      PT LONG TERM GOAL #3   Title Pt will increase Berg from 28 to >40/56 for improved balance and decreased fall risk.    Baseline 03/07/21 28/56    Time 8    Period Weeks    Status New    Target Date 05/04/21      PT LONG TERM GOAL #4   Title Pt will ambulate >500' on varied surfaces with LRAD mod I for improved community access.    Time 8    Period Weeks    Status New    Target Date 05/04/21                    Plan - 03/08/21 2122     Clinical Impression Statement Pt is 51 y/o male with history of multiple CVA. Recent intracerebral hemorrhage of basal ganglia on 02/17/21. Pt has mild weakness noted on left 4+/5. He is fall risk based on 5 x sit to stand of 17.96 sec and is tends to lose balance posterior. Also high fall risk based on Berg score of 28/56. Pt demonstrates increased ataxia in standing when UE support is removed. He is walking with RW CGA/min assist. Does better when slows down but has LOB to left at times. Ataxia seems to be more from trunk. Pt ambulating at slow gait speed of 0.34m/s indicating decreased safety with household mobility. Pt will benefit from skilled PT to address strength, balance and functional mobility deficits.    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  HTN DM R Toe amputation, nonadherence to medications    Examination-Activity Limitations Locomotion Level;Transfers;Stairs;Stand;Dressing;Bend    Examination-Participation Restrictions Meal Prep;Cleaning;Community Activity;Yard Work    Merchant navy officer Evolving/Moderate complexity    Clinical Decision Making Moderate    Rehab Potential Good    PT Frequency 2x / week   plus eval   PT Duration 8 weeks    PT Treatment/Interventions ADLs/Self Care Home Management;Aquatic Therapy;DME Instruction;Gait training;Stair training;Functional mobility training;Therapeutic activities;Therapeutic exercise;Balance training;Neuromuscular re-education;Manual techniques;Passive range of motion;Vestibular;Patient/family education    PT Next Visit Plan Monitor BP closely. Gait training with walker. Pt wants to use rollator and actually did ok with brief trial last visit but will need more training and education. Reports he is getting one. Work on engaging core with balance activities. May want to try tall kneeling as well. Eventual through to use BWS over treadmill.    Consulted and Agree with Plan of Care Patient             Patient will benefit from skilled therapeutic intervention in order to improve the following deficits and impairments:  Abnormal gait, Decreased balance, Decreased mobility, Decreased strength, Decreased knowledge of use of DME, Decreased activity tolerance, Decreased coordination  Visit Diagnosis: Other abnormalities of gait and mobility  Muscle weakness (generalized)  Unsteadiness on feet     Problem List Patient Active Problem List   Diagnosis Date Noted   Anemia in chronic kidney disease 03/05/2021   Chronic kidney disease 03/05/2021   Diabetes ([place]) 03/05/2021   Non compliance w medication regimen 03/05/2021   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

## 2021-03-09 NOTE — Progress Notes (Signed)
° °  Subjective:    Patient ID: Carlos Rice, male    DOB: [birth year], 51 y.o.   MRN: [id number]  HPI An audio/video tele-health visit is felt to be the most appropriate encounter for this patient at this time. This is a follow up tele-visit via phone. The patient is at home. MD is at office. Prior to scheduling this appointment, our staff discussed the limitations of evaluation and management by telemedicine and the availability of in-person appointments. The patient expressed understanding and agreed to proceed.   Carlos Rice is a 51 year old man presenting for f/u of ICH.  1) ICH -started outpatient therapy and had a good eval day -he hopes they work him as hard as he worked in inpatient rehab -he feels he is slowed down by his current walker and would prefer a rolling walker instead- placed order, he asks how much cost will be and how soon he can get this -he was able to bike 2 miles and his son felt this was too much.  -he has been able to take 5 steps without his walker  2) HTN -His BP has improved to 150s/90s- has been stable at this level -likes kiwis    Review of Systems +impaired balance and coordination    Objective:   Physical Exam Not performed as seen via phone        Assessment & Plan:  1) ICH -prescribed rolling walker, advised he call clinic to check with April on status of referral, discussed that company would let him know potential cost before selling it to him.  -continue outpatient therapy -commended on bike riding and encouraged exercise  2) HTN -encouraged him to keep blood pressure log to share with me -recommended eating 1 kiwi daily.   8 minutes spent in discussion of his physical therapy, recommending to call April to check on status of referral for DME, discussed that DME company would let him know cost of walker prior to selling it to him.

## 2021-03-13 ENCOUNTER — Encounter: Payer: Self-pay | Admitting: Occupational Therapy

## 2021-03-13 ENCOUNTER — Ambulatory Visit: Payer: Medicare HMO | Admitting: Occupational Therapy

## 2021-03-13 ENCOUNTER — Other Ambulatory Visit: Payer: Self-pay

## 2021-03-13 DIAGNOSIS — R2681 Unsteadiness on feet: Secondary | ICD-10-CM

## 2021-03-13 DIAGNOSIS — R278 Other lack of coordination: Secondary | ICD-10-CM

## 2021-03-13 DIAGNOSIS — M6281 Muscle weakness (generalized): Secondary | ICD-10-CM | POA: Diagnosis not present

## 2021-03-13 DIAGNOSIS — R2689 Other abnormalities of gait and mobility: Secondary | ICD-10-CM

## 2021-03-13 DIAGNOSIS — R41842 Visuospatial deficit: Secondary | ICD-10-CM

## 2021-03-13 NOTE — Patient Instructions (Signed)
Basic Activities:    Use your affected hand to perform the following activities for 20-30 minutes 1-2 times/day.  Stop activity if you experience pain.   - Flip playing cards - Deal top card with thumb - Toss ball - Rotate ball in your hand  - Turn doorknob - Open/close cabinet door with handle - Pick up coins and place in a container - Stack coins (stacks of 5) and manipulate one at a time to fingertips to place in bank/container - Fold towels - Stack blocks - Pick up 1-inch blocks - Put empty clothes hangers on a rack, remove, and repeat    

## 2021-03-13 NOTE — Therapy (Signed)
[facility] [address] [place], [place], [address] Phone: [phone]   Fax:  [phone]  Occupational Therapy Treatment  Patient Details  Name: Carlos Rice MRN: [id number] Date of Birth: 1970-03-13 Referring Provider (OT): Erenest Rasher f/u   Encounter Date: 03/13/2021   OT End of Session - 03/13/21 1614     Visit Number 2    Number of Visits 13    Date for OT Re-Evaluation 05/02/21    Authorization Type [employer]    Authorization Time Period $20 copay - Auth Req'd - VL:MN    OT Start Time 1612    OT Stop Time 1700    OT Time Calculation (min) 48 min    Activity Tolerance Patient tolerated treatment well    Behavior During Therapy [facility] for tasks assessed/performed             Past Medical History:  Diagnosis Date   CKD (chronic kidney disease) stage 4, GFR 15-29 ml/min (HCC)    CVA (cerebral vascular accident) ([place])    High blood pressure    History of noncompliance with medical treatment    T2DM (type 2 diabetes mellitus) ([place])     Past Surgical History:  Procedure Laterality Date   AMPUTATION TOE Right    great toe   AMPUTATION TOE Left    5th toe   DG GREAT TOE RIGHT FOOT     great toe     ROTATOR CUFF REPAIR Right 2018    There were no vitals filed for this visit.   Subjective Assessment - 03/13/21 1613     Subjective  "i'm getting a new walker with the wheels on it - I've been walking without hte walker, nothing crazy but like 4-5 steps. I also feel like my vision has gotten more off - i'm seeing more double now"    Pertinent History T2DM, CKD, CVA, HTN    Limitations Fall Risk. Ataxia RUE.    Patient Stated Goals "get off this walker and I just wanna walk" and then reported "I want to write" and "I want to walk around [facility]."    Currently in Pain? No/denies    Pain Score 0-No pain                          OT Treatments/Exercises (OP) - 03/13/21 1617        ADLs   ADL Comments pt verbalized understanding of goals that were reviewed today.             worked on [employer] with RUE with min difficulty with coordination. Pt with difficulty with colors yellow vs orange and blue vs purple. Pt attended to left side of board only at first.  Self corrected color errors with copying pattern.        OT Education - 03/13/21 1621     Education Details Coordination HEP    Person(s) Educated Patient    Methods Explanation;Demonstration    Comprehension Verbalized understanding;Returned demonstration              OT Short Term Goals - 03/13/21 1620       OT SHORT TERM GOAL #1   Title Pt will be independent with HEP for coordination and grip strength    Time 4    Period Weeks    Status On-going    Target Date 04/04/21      OT SHORT TERM GOAL #  2   Title Pt will demonstrate dynamic standing balance with no LOB in order to increase independence and safety with ADLs.    Time 4    Period Weeks    Status New      OT SHORT TERM GOAL #3   Title Pt will increase Box and Blocks score, Bilterally, by 5 blocks    Baseline R 32, L 38    Time 4    Period Weeks    Status On-going      OT SHORT TERM GOAL #4   Title Pt will demonstrate writing first and last name with 100% legibility with use of adapted strategies for ataxia    Time 4    Period Weeks    Status New               OT Long Term Goals - 03/07/21 1545       OT LONG TERM GOAL #1   Title Pt will be independent with any updated HEPs    Time 8    Period Weeks    Status New    Target Date 05/02/21      OT LONG TERM GOAL #2   Title Pt will increase coordination in RUE by completing 9 hole peg test in 75 seconds or less.    Baseline R 84s, L 45.28s    Time 8    Period Weeks    Status New      OT LONG TERM GOAL #3   Title Pt will increase legibiity of handwriting with use of strategies by writing 1-2 sentences with 100% legibility.    Time 8    Period Weeks     Status New      OT LONG TERM GOAL #4   Title Pt will verbalize understanding of adapted strategies for increasing independence and safety with ADLs and IADLs.    Time 8    Period Weeks    Status New      OT LONG TERM GOAL #5   Title Pt will increase grip strength in RUE by 5 lbs or greater.    Baseline R 66.7, L 78    Time 8    Period Weeks    Status New                   Plan - 03/13/21 1654     Clinical Impression Statement Pt verbalized understanding of goals set at evaluation.    OT Occupational Profile and History Detailed Assessment- Review of Records and additional review of physical, cognitive, psychosocial history related to current functional performance    Occupational performance deficits (Please refer to evaluation for details): IADL's;ADL's;Leisure;Work    Marketing executive / Function / Physical Skills ADL;Decreased knowledge of use of DME;Strength;Coordination;FMC;Flexibility;Mobility;ROM;IADL;Vision;Proprioception;UE functional use;GMC;Dexterity    Rehab Potential Good    Clinical Decision Making Several treatment options, min-mod task modification necessary    Comorbidities Affecting Occupational Performance: May have comorbidities impacting occupational performance    Modification or Assistance to Complete Evaluation  Min-Moderate modification of tasks or assist with assess necessary to complete eval    OT Frequency 2x / week    OT Duration 8 weeks   12 visits over 8 weeks d/t any scheduling conflicts.   OT Treatment/Interventions Self-care/ADL training;Splinting;DME and/or AE instruction;Fluidtherapy;Aquatic Therapy;Therapeutic activities;Therapeutic exercise;Manual Therapy;Patient/family education;Visual/perceptual remediation/compensation;Functional Mobility Training;Passive range of motion;Neuromuscular education    Plan standing balance -dynamic for increased independence with ADLs, RUE strategies for ataxia for handwriting  Consulted and Agree with  Plan of Care Patient             Patient will benefit from skilled therapeutic intervention in order to improve the following deficits and impairments:   Body Structure / Function / Physical Skills: ADL, Decreased knowledge of use of DME, Strength, Coordination, FMC, Flexibility, Mobility, ROM, IADL, Vision, Proprioception, UE functional use, GMC, Dexterity       Visit Diagnosis: Other abnormalities of gait and mobility  Unsteadiness on feet  Muscle weakness (generalized)  Other lack of coordination  Visuospatial deficit    Problem List Patient Active Problem List   Diagnosis Date Noted   Anemia in chronic kidney disease 03/05/2021   Chronic kidney disease 03/05/2021   Diabetes ([place]) 03/05/2021   Non compliance w medication regimen 03/05/2021   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 [place], [place], [address] Phone: [phone]   Fax:  [phone]  Name: Carlos Rice MRN: [id number] Date of Birth: May 25, 1970

## 2021-03-14 ENCOUNTER — Ambulatory Visit: Payer: Medicare HMO

## 2021-03-15 ENCOUNTER — Other Ambulatory Visit: Payer: Self-pay

## 2021-03-15 ENCOUNTER — Encounter (HOSPITAL_BASED_OUTPATIENT_CLINIC_OR_DEPARTMENT_OTHER): Payer: Medicare HMO | Admitting: Physical Medicine and Rehabilitation

## 2021-03-15 DIAGNOSIS — I10 Essential (primary) hypertension: Secondary | ICD-10-CM

## 2021-03-15 DIAGNOSIS — I610 Nontraumatic intracerebral hemorrhage in hemisphere, subcortical: Secondary | ICD-10-CM | POA: Diagnosis not present

## 2021-03-15 NOTE — Progress Notes (Signed)
° °  Subjective:    Patient ID: Carlos Rice, male    DOB: 06/04/1970, 51 y.o.   MRN: [id number]  HPI An audio/video tele-health visit is felt to be the most appropriate encounter for this patient at this time. This is a follow up tele-visit via phone. The patient is at home. MD is at office. Prior to scheduling this appointment, our staff discussed the limitations of evaluation and management by telemedicine and the availability of in-person appointments. The patient expressed understanding and agreed to proceed.   Carlos Rice is a 51 year old man presenting for f/u of ICH, impaired mobility, and HTN  1) ICH -started outpatient therapy and had a good eval day -he hopes they work him as hard as he worked in inpatient rehab -he feels he is slowed down by his current walker and would prefer a rolling walker instead- placed order, he asks how much cost will be and how soon he can get this -he was able to bike 2 miles and his son felt this was too much.  -he has been able to take 5 steps without his walker -he has tried applying tennis balls to his walker and this hasn't helped! -has not received the walker yet.  -missed therapy yesterday since his mother did not want to drive in the heavy rain -he has been doing core and leg exercises  2) HTN -His BP has improved to 140s/96 -he just got kiwis -he has been trying juicing.     Review of Systems +impaired balance and coordination    Objective:   Physical Exam Not performed as seen via phone        Assessment & Plan:  1) ICH -prescribed rolling walker, advised he call clinic to check with April on status of referral, discussed that company would let him know potential cost before selling it to him.  -continue outpatient therapy -commended on bike riding and encouraged exercise  2) HTN -encouraged him to keep blood pressure log to share with me -recommended eating 1 kiwi daily.  -continue juicing, BP improving  5 minutes  spent in discussion of his impaired mobility, need for rolling walker, communicating with [employer], blood pressure

## 2021-03-16 ENCOUNTER — Other Ambulatory Visit: Payer: Self-pay | Admitting: Physical Medicine and Rehabilitation

## 2021-03-16 DIAGNOSIS — I610 Nontraumatic intracerebral hemorrhage in hemisphere, subcortical: Secondary | ICD-10-CM

## 2021-03-16 DIAGNOSIS — I10 Essential (primary) hypertension: Secondary | ICD-10-CM

## 2021-03-19 ENCOUNTER — Ambulatory Visit: Payer: Medicare HMO

## 2021-03-19 ENCOUNTER — Encounter: Payer: Self-pay | Admitting: Occupational Therapy

## 2021-03-19 ENCOUNTER — Ambulatory Visit: Payer: Medicare HMO | Admitting: Occupational Therapy

## 2021-03-19 ENCOUNTER — Other Ambulatory Visit: Payer: Self-pay

## 2021-03-19 DIAGNOSIS — R278 Other lack of coordination: Secondary | ICD-10-CM

## 2021-03-19 DIAGNOSIS — M6281 Muscle weakness (generalized): Secondary | ICD-10-CM

## 2021-03-19 DIAGNOSIS — R2689 Other abnormalities of gait and mobility: Secondary | ICD-10-CM

## 2021-03-19 DIAGNOSIS — R270 Ataxia, unspecified: Secondary | ICD-10-CM

## 2021-03-19 DIAGNOSIS — R41842 Visuospatial deficit: Secondary | ICD-10-CM

## 2021-03-19 DIAGNOSIS — R2681 Unsteadiness on feet: Secondary | ICD-10-CM

## 2021-03-19 NOTE — Therapy (Signed)
[facility] [address] [place], [place], [address] Phone: [phone]   Fax:  [phone]  Occupational Therapy Treatment  Patient Details  Name: Carlos Rice MRN: [id number] Date of Birth: Aug 11, 1970 Referring Provider (OT): Erenest Rasher f/u   Encounter Date: 03/19/2021   OT End of Session - 03/19/21 1624     Visit Number 3    Number of Visits 13    Date for OT Re-Evaluation 05/02/21    Authorization Type [employer]    Authorization Time Period $20 copay - Auth Req'd - VL:MN    OT Start Time 1618    OT Stop Time 1658    OT Time Calculation (min) 40 min    Activity Tolerance Patient tolerated treatment well    Behavior During Therapy [facility] for tasks assessed/performed             Past Medical History:  Diagnosis Date   CKD (chronic kidney disease) stage 4, GFR 15-29 ml/min (HCC)    CVA (cerebral vascular accident) ([place])    High blood pressure    History of noncompliance with medical treatment    T2DM (type 2 diabetes mellitus) ([place])     Past Surgical History:  Procedure Laterality Date   AMPUTATION TOE Right    great toe   AMPUTATION TOE Left    5th toe   DG GREAT TOE RIGHT FOOT     great toe     ROTATOR CUFF REPAIR Right 2018    There were no vitals filed for this visit.   Subjective Assessment - 03/19/21 1623     Subjective  "my first question for you is how do i raise this thing?" about his new rollator that MD ordered    Pertinent History T2DM, CKD, CVA, HTN    Limitations Fall Risk. Ataxia RUE.    Patient Stated Goals "get off this walker and I just wanna walk" and then reported "I want to write" and "I want to walk around [facility]."    Currently in Pain? No/denies    Pain Score 0-No pain                          OT Treatments/Exercises (OP) - 03/19/21 1628       ADLs   Functional Mobility adjusted rollator for handles to be slightly higher per patient's  request however OT reocmmends patient received more education and training on use of rollator for use but raised for increased safety for the day since patient was using it to attend therapy today.      Exercises   Exercises Hand      Fine Motor Coordination (Hand/Wrist)   Fine Motor Coordination Handwriting;Grooved pegs    Handwriting trialing different adapted grips for patient for increasing legibility with handwriting. Trialed foam grip, weighted pen and felt tip with foam/coban. Pt reports likely foam grip the best    Grooved pegs with RUE with min cues for only using RUE and with mod/max drops and mod/max difficulty with coordination                      OT Short Term Goals - 03/13/21 1620       OT SHORT TERM GOAL #1   Title Pt will be independent with HEP for coordination and grip strength    Time 4    Period Weeks    Status On-going  Target Date 04/04/21      OT SHORT TERM GOAL #2   Title Pt will demonstrate dynamic standing balance with no LOB in order to increase independence and safety with ADLs.    Time 4    Period Weeks    Status New      OT SHORT TERM GOAL #3   Title Pt will increase Box and Blocks score, Bilterally, by 5 blocks    Baseline R 32, L 38    Time 4    Period Weeks    Status On-going      OT SHORT TERM GOAL #4   Title Pt will demonstrate writing first and last name with 100% legibility with use of adapted strategies for ataxia    Time 4    Period Weeks    Status New               OT Long Term Goals - 03/07/21 1545       OT LONG TERM GOAL #1   Title Pt will be independent with any updated HEPs    Time 8    Period Weeks    Status New    Target Date 05/02/21      OT LONG TERM GOAL #2   Title Pt will increase coordination in RUE by completing 9 hole peg test in 75 seconds or less.    Baseline R 84s, L 45.28s    Time 8    Period Weeks    Status New      OT LONG TERM GOAL #3   Title Pt will increase legibiity of  handwriting with use of strategies by writing 1-2 sentences with 100% legibility.    Time 8    Period Weeks    Status New      OT LONG TERM GOAL #4   Title Pt will verbalize understanding of adapted strategies for increasing independence and safety with ADLs and IADLs.    Time 8    Period Weeks    Status New      OT LONG TERM GOAL #5   Title Pt will increase grip strength in RUE by 5 lbs or greater.    Baseline R 66.7, L 78    Time 8    Period Weeks    Status New                   Plan - 03/19/21 1624     Clinical Impression Statement Pt arrived with rollator ordered by MD. Pt req'd mod cues for safety with rollator use during session. Recommended speaking to PT and practicing with PT.    OT Occupational Profile and History Detailed Assessment- Review of Records and additional review of physical, cognitive, psychosocial history related to current functional performance    Occupational performance deficits (Please refer to evaluation for details): IADL's;ADL's;Leisure;Work    Marketing executive / Function / Physical Skills ADL;Decreased knowledge of use of DME;Strength;Coordination;FMC;Flexibility;Mobility;ROM;IADL;Vision;Proprioception;UE functional use;GMC;Dexterity    Rehab Potential Good    Clinical Decision Making Several treatment options, min-mod task modification necessary    Comorbidities Affecting Occupational Performance: May have comorbidities impacting occupational performance    Modification or Assistance to Complete Evaluation  Min-Moderate modification of tasks or assist with assess necessary to complete eval    OT Frequency 2x / week    OT Duration 8 weeks   12 visits over 8 weeks d/t any scheduling conflicts.   OT Treatment/Interventions Self-care/ADL training;Splinting;DME and/or AE instruction;Fluidtherapy;Aquatic Therapy;Therapeutic activities;Therapeutic  exercise;Manual Therapy;Patient/family education;Visual/perceptual remediation/compensation;Functional  Mobility Training;Passive range of motion;Neuromuscular education    Plan standing balance -dynamic for increased independence with ADLs, coordination, handwriting    Consulted and Agree with Plan of Care Patient             Patient will benefit from skilled therapeutic intervention in order to improve the following deficits and impairments:   Body Structure / Function / Physical Skills: ADL, Decreased knowledge of use of DME, Strength, Coordination, FMC, Flexibility, Mobility, ROM, IADL, Vision, Proprioception, UE functional use, GMC, Dexterity       Visit Diagnosis: Other abnormalities of gait and mobility  Unsteadiness on feet  Muscle weakness (generalized)  Other lack of coordination  Visuospatial deficit  Ataxia    Problem List Patient Active Problem List   Diagnosis Date Noted   Anemia in chronic kidney disease 03/05/2021   Chronic kidney disease 03/05/2021   Diabetes ([place]) 03/05/2021   Non compliance w medication regimen 03/05/2021   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 [place], [place], [address] Phone: [phone]   Fax:  [phone]  Name: Carlos Rice MRN: [id number] Date of Birth: 1970-09-09

## 2021-03-22 ENCOUNTER — Other Ambulatory Visit: Payer: Self-pay

## 2021-03-22 ENCOUNTER — Ambulatory Visit: Payer: Medicare HMO | Attending: Physician Assistant

## 2021-03-22 VITALS — BP 162/90

## 2021-03-22 DIAGNOSIS — R41842 Visuospatial deficit: Secondary | ICD-10-CM | POA: Insufficient documentation

## 2021-03-22 DIAGNOSIS — R270 Ataxia, unspecified: Secondary | ICD-10-CM | POA: Diagnosis present

## 2021-03-22 DIAGNOSIS — M6281 Muscle weakness (generalized): Secondary | ICD-10-CM | POA: Diagnosis present

## 2021-03-22 DIAGNOSIS — R2689 Other abnormalities of gait and mobility: Secondary | ICD-10-CM | POA: Diagnosis not present

## 2021-03-22 DIAGNOSIS — R278 Other lack of coordination: Secondary | ICD-10-CM | POA: Insufficient documentation

## 2021-03-22 DIAGNOSIS — R2681 Unsteadiness on feet: Secondary | ICD-10-CM | POA: Insufficient documentation

## 2021-03-22 NOTE — Therapy (Signed)
[facility] [address] [place], [place], [address] Phone: [phone]   Fax:  [phone]  Physical Therapy Treatment  Patient Details  Name: Carlos Rice MRN: [id number] Date of Birth: [birth year] Referring Provider (PT): Lauraine Rinne   Encounter Date: 03/22/2021   PT End of Session - 03/22/21 0941     Visit Number 2    Number of Visits 17    Date for PT Re-Evaluation 05/04/21    Authorization Type human[employer] medicare so 10th visit progress note    PT Start Time 0939    PT Stop Time 1017    PT Time Calculation (min) 38 min    Equipment Utilized During Treatment Gait belt    Activity Tolerance Patient tolerated treatment well    Behavior During Therapy WFL for tasks assessed/performed             Past Medical History:  Diagnosis Date   CKD (chronic kidney disease) stage 4, GFR 15-29 ml/min (HCC)    CVA (cerebral vascular accident) ([place])    High blood pressure    History of noncompliance with medical treatment    T2DM (type 2 diabetes mellitus) ([place])     Past Surgical History:  Procedure Laterality Date   AMPUTATION TOE Right    great toe   AMPUTATION TOE Left    5th toe   DG GREAT TOE RIGHT FOOT     great toe     ROTATOR CUFF REPAIR Right 2018    Vitals:   03/22/21 0944  BP: (!) 162/90     Subjective Assessment - 03/22/21 0941     Subjective Pt reports that he wants to have more therapy. Has been trying to walk some without walker. He denies any falls. He did get the 4 wheeled walker.    Pertinent History CVAx7. Did have rehab in [place] with prior but was not happy with it.  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    Currently in Pain? No/denies                               [facility] Adult PT Treatment/Exercise - 03/22/21 0947       Transfers   Transfers Sit  to Stand;Stand to Sit    Sit to Stand 5: Supervision;4: Min guard;With upper extremity assist    Sit to Stand Details Verbal cues for technique    Sit to Stand Details (indicate cue type and reason) Verbal cues to engage core and control movement    Stand to Sit 5: Supervision;4: Min guard      Ambulation/Gait   Ambulation/Gait Yes    Ambulation/Gait Assistance 4: Min guard    Ambulation/Gait Assistance Details Verbal cues to keep walker close. Pt keeps hands on breaks. Cued to focus on foot placement, engage core and stay upright. Pt reported arm fatigued as went on.    Ambulation Distance (Feet) 345 Feet    Assistive device Rollator    Gait Pattern Step-through pattern;Decreased step length - right;Decreased step length - left;Decreased dorsiflexion - right;Decreased dorsiflexion - left;Ataxic    Ambulation Surface Level;Indoor      Neuro Re-ed    Neuro Re-ed Details  Tall kneeling on mat with green physioball in front: trying to maintain tall kneeling without UE support x  30 sec, then hip hinge with 1 UE support on ball x 10 then raising 3.3# ball overhead and controlling back down x 10 then D1 diagonals with the ball x 5 to each side. Verbal cues to engage core throughout with CGA and a couple breaks between activities as did fatigue. BP=168/92 after                     PT Education - 03/22/21 1450     Education Details Instructed in tall kneeling in front of couch at home to work on engaging core with slow, controlled movements.    Person(s) Educated Patient    Methods Explanation;Demonstration;Handout    Comprehension Verbalized understanding              PT Short Term Goals - 03/08/21 1218       PT SHORT TERM GOAL #1   Title Pt will be able to perform initial HEP for strengthening and balance on own.    Time 4    Period Weeks    Status New    Target Date 04/05/21      PT SHORT TERM GOAL #2   Title Pt will decrease 5 x sit to stand from 17.96 sec to<15 sec  with improved control for improved balance and functional strength.    Baseline 03/07/21 17.96 sec without hands from chair    Time 4    Period Weeks    Status New    Target Date 04/05/21      PT SHORT TERM GOAL #3   Title Pt will be able to ambulate 400' on level surfaces with walker mod I for improved household mobility and short community distances.    Time 4    Period Weeks    Status New    Target Date 04/05/21      PT SHORT TERM GOAL #4   Title Pt will increase Berg from 28/56 to >34/56 for improved balance.    Baseline 03/07/21 28/56    Time 4    Period Weeks    Status New    Target Date 04/05/21               PT Long Term Goals - 03/08/21 1220       PT LONG TERM GOAL #1   Title Pt will be independent with progressive HEP for strengthening, balance and aerobic activity to continue gains on own.    Time 8    Period Weeks    Status New    Target Date 05/01/21      PT LONG TERM GOAL #2   Title Pt will increase gait speed to >0.38m/s for improved gait safety.    Baseline 03/07/21 0.18m/s    Time 8    Period Weeks    Status New    Target Date 05/04/21      PT LONG TERM GOAL #3   Title Pt will increase Berg from 28 to >40/56 for improved balance and decreased fall risk.    Baseline 03/07/21 28/56    Time 8    Period Weeks    Status New    Target Date 05/04/21      PT LONG TERM GOAL #4   Title Pt will ambulate >500' on varied surfaces with LRAD mod I for improved community access.    Time 8    Period Weeks    Status New    Target Date 05/04/21  Plan - 03/22/21 1451     Clinical Impression Statement Pt needed some cuing to keep walker close and unsteady at times especially with turning with ataxic movements. 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 Focused on slow, controlled movements with engaging core in tall kneeling to start today. Pt did fatigue quickly.    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  HTN DM R Toe amputation, nonadherence to medications    Examination-Activity Limitations Locomotion Level;Transfers;Stairs;Stand;Dressing;Bend    Examination-Participation Restrictions Meal Prep;Cleaning;Community Activity;Yard Work    Merchant navy officer Evolving/Moderate complexity    Rehab Potential Good    PT Frequency 2x / week   plus eval   PT Duration 8 weeks    PT Treatment/Interventions ADLs/Self Care Home Management;Aquatic Therapy;DME Instruction;Gait training;Stair training;Functional mobility training;Therapeutic activities;Therapeutic exercise;Balance training;Neuromuscular re-education;Manual techniques;Passive range of motion;Vestibular;Patient/family education    PT Next Visit Plan Monitor BP closely. Continue gait training with pt's new rollator. Have advised not to go without in home. Continue to work on engaging core with balance activities with slow, controlled movements- try tall kneeling as well. Eventual thought to use BWS over treadmill.    Consulted and Agree with Plan of Care Patient             Patient will benefit from skilled therapeutic intervention in order to improve the following deficits and impairments:  Abnormal gait, Decreased balance, Decreased mobility, Decreased strength, Decreased knowledge of use of DME, Decreased activity tolerance, Decreased coordination  Visit Diagnosis: Other abnormalities of gait and mobility  Muscle weakness (generalized)  Unsteadiness on feet     Problem List Patient Active Problem List   Diagnosis Date Noted   Anemia in chronic kidney disease 03/05/2021   Chronic kidney disease 03/05/2021   Diabetes ([place]) 03/05/2021   Non compliance w medication regimen 03/05/2021   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 [place], [place], [address] Phone: [phone]   Fax:  [phone]  Name: Carlos Rice MRN: [id number] Date of Birth: 1970/09/23

## 2021-03-22 NOTE — Patient Instructions (Signed)
Access Code: [email or web address] URL: [email or web address] Date: 03/22/2021 Prepared by: Cherly Anderson  Exercises Tall Kneeling Hip Hinge - 2 x daily - 5 x weekly - 2 sets - 10 reps Tall Kneeling Diagonal Lift - 2 x daily - 5 x weekly - 2 sets - 10 reps

## 2021-03-26 ENCOUNTER — Ambulatory Visit: Payer: Medicare HMO | Admitting: Physical Therapy

## 2021-03-26 ENCOUNTER — Other Ambulatory Visit: Payer: Self-pay

## 2021-03-26 ENCOUNTER — Encounter: Payer: Self-pay | Admitting: Occupational Therapy

## 2021-03-26 ENCOUNTER — Ambulatory Visit: Payer: Medicare HMO | Admitting: Occupational Therapy

## 2021-03-26 VITALS — BP 170/94 | HR 78

## 2021-03-26 DIAGNOSIS — R2689 Other abnormalities of gait and mobility: Secondary | ICD-10-CM

## 2021-03-26 DIAGNOSIS — R41842 Visuospatial deficit: Secondary | ICD-10-CM

## 2021-03-26 DIAGNOSIS — R278 Other lack of coordination: Secondary | ICD-10-CM

## 2021-03-26 DIAGNOSIS — R2681 Unsteadiness on feet: Secondary | ICD-10-CM

## 2021-03-26 DIAGNOSIS — M6281 Muscle weakness (generalized): Secondary | ICD-10-CM

## 2021-03-26 DIAGNOSIS — R270 Ataxia, unspecified: Secondary | ICD-10-CM

## 2021-03-26 NOTE — Patient Instructions (Signed)
1. Grip Strengthening (Resistive Putty)   Squeeze putty using thumb and all fingers. Repeat _20___ times. Do __2__ sessions per day.   2. Roll putty into tube on table and pinch between each finger and thumb x 10 reps each. (can do ring and small finger together)     Copyright  VHI. All rights reserved.   

## 2021-03-26 NOTE — Therapy (Signed)
[facility] [address] [place], [place], [address] Phone: [phone]   Fax:  [phone]  Physical Therapy Treatment  Patient Details  Name: Carlos Rice MRN: [id number] Date of Birth: 02-10-1971 Referring Provider (PT): Lauraine Rinne   Encounter Date: 03/26/2021   PT End of Session - 03/26/21 2127     Visit Number 3    Number of Visits 17    Date for PT Re-Evaluation 05/04/21    Authorization Type [employer]; $20 copay per discipline; 10th visit PN    Authorization Time Period 17 visits approved from 1/18 - 3/17    Authorization - Visit Number 2    Authorization - Number of Visits 17    Progress Note Due on Visit 10    PT Start Time 1530    PT Stop Time 1615    PT Time Calculation (min) 45 min    Equipment Utilized During Treatment Gait belt    Activity Tolerance Patient tolerated treatment well    Behavior During Therapy WFL for tasks assessed/performed             Past Medical History:  Diagnosis Date   CKD (chronic kidney disease) stage 4, GFR 15-29 ml/min (HCC)    CVA (cerebral vascular accident) ([place])    High blood pressure    History of noncompliance with medical treatment    T2DM (type 2 diabetes mellitus) ([place])     Past Surgical History:  Procedure Laterality Date   AMPUTATION TOE Right    great toe   AMPUTATION TOE Left    5th toe   DG GREAT TOE RIGHT FOOT     great toe     ROTATOR CUFF REPAIR Right 2018    Vitals:   03/26/21 1535  BP: (!) 170/94  Pulse: 78     Subjective Assessment - 03/26/21 1532     Subjective Is looking for a new house.  Did try tall kneeling at home; used a 15lb weight - didn't do as many reps.  Rode recumbent bike this weekend for about an hour.  BP has been running 155/90.    Pertinent History CVAx7. Did have rehab in [place] with prior but was not happy with it.  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    Currently in Pain? No/denies               [facility] Adult PT Treatment/Exercise - 03/26/21 1542       Transfers   Transfers Sit to Stand;Stand to Sit    Sit to Stand 4: Min guard    Stand to Sit 4: Min guard    Transfer Cueing When standing with rollator pt uses rollator for UE support.  Performed sit > stand with out rollator with min A to stabilize once standing      Ambulation/Gait   Ambulation/Gait Yes    Ambulation/Gait Assistance 2: Max assist    Ambulation/Gait Assistance Details Performed without rollator to assess gait; pt demonstrates decreased stance time on R and decreased step length and foot clearance on L; keeps weight forwards.  Anterior LOB and lateral LOB.    Ambulation Distance (Feet) 30 Feet    Assistive device None    Ambulation Surface Level;Indoor    Pre-Gait Activities in // bars performed walking forwards x 6 reps without UE support with  PT in front of patient providing resistance at pelvis to facilitate grading of movement, control of anterior-lateral weight shifting, increased stance time on LLE and more upright posture.  Also performed gait forwards in // bars without UE support and without resistance but with PT behind pt for safety - continued to provide cues for weight shifting, increased stance time on LLE and increased step length and foot clearance bilaterally.      Neuro Re-ed    Neuro Re-ed Details  Quadruped weight shifting forwards with knee to elbow taps x 10 reps each side.  Also performed weight shift forwards with single knee lift and hold x 5 seconds x 5 reps each side.  Min A due to R shoulder weakness.  Changed to tall kneeling with UE support on physioball changing between tall kneeling to half kneeling with R foot forwards with min A for weight shift and upright trunk.  Changed to UE support on chair for more support when bringing L foot forwards.  Performed 5 reps each  side.               PT Short Term Goals - 03/08/21 1218       PT SHORT TERM GOAL #1   Title Pt will be able to perform initial HEP for strengthening and balance on own.    Time 4    Period Weeks    Status New    Target Date 04/05/21      PT SHORT TERM GOAL #2   Title Pt will decrease 5 x sit to stand from 17.96 sec to<15 sec with improved control for improved balance and functional strength.    Baseline 03/07/21 17.96 sec without hands from chair    Time 4    Period Weeks    Status New    Target Date 04/05/21      PT SHORT TERM GOAL #3   Title Pt will be able to ambulate 400' on level surfaces with walker mod I for improved household mobility and short community distances.    Time 4    Period Weeks    Status New    Target Date 04/05/21      PT SHORT TERM GOAL #4   Title Pt will increase Berg from 28/56 to >34/56 for improved balance.    Baseline 03/07/21 28/56    Time 4    Period Weeks    Status New    Target Date 04/05/21               PT Long Term Goals - 03/08/21 1220       PT LONG TERM GOAL #1   Title Pt will be independent with progressive HEP for strengthening, balance and aerobic activity to continue gains on own.    Time 8    Period Weeks    Status New    Target Date 05/01/21      PT LONG TERM GOAL #2   Title Pt will increase gait speed to >0.7m/s for improved gait safety.    Baseline 03/07/21 0.17m/s    Time 8    Period Weeks    Status New    Target Date 05/04/21      PT LONG TERM GOAL #3   Title Pt will increase Berg from 28 to >40/56 for improved balance and decreased fall risk.    Baseline 03/07/21 28/56    Time 8    Period Weeks    Status New    Target  Date 05/04/21      PT LONG TERM GOAL #4   Title Pt will ambulate >500' on varied surfaces with LRAD mod I for improved community access.    Time 8    Period Weeks    Status New    Target Date 05/04/21                   Plan - 03/26/21 2129     Clinical Impression  Statement Continued to utilize quadruped, tall kneeling and resisted gait in // bars to facilitate increased proximal stability during weight shifting to improve LE motor control and gait sequence.  As pt fatigued he demonstrated increased LE scissoring, increased trunk flexion and WB through UE when ambulating with rollator.  Pt also demonstrates limited tolerance to quadruped due to R shoulder weakness and pain.    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    Examination-Activity Limitations Locomotion Level;Transfers;Stairs;Stand;Dressing;Bend    Examination-Participation Restrictions Meal Prep;Cleaning;Community Activity;Yard Work    Merchant navy officer Evolving/Moderate complexity    Rehab Potential Good    PT Frequency 2x / week   plus eval   PT Duration 8 weeks    PT Treatment/Interventions ADLs/Self Care Home Management;Aquatic Therapy;DME Instruction;Gait training;Stair training;Functional mobility training;Therapeutic activities;Therapeutic exercise;Balance training;Neuromuscular re-education;Manual techniques;Passive range of motion;Vestibular;Patient/family education    PT Next Visit Plan Monitor BP closely. Continue gait training with pt's new rollator. Continue to work on engaging core with balance activities with slow, controlled movements-tall kneeling and quadruped as shoulder is able to tolerate. Eventual thought to use BWS over treadmill.    Consulted and Agree with Plan of Care Patient             Patient will benefit from skilled therapeutic intervention in order to improve the following deficits and impairments:  Abnormal gait, Decreased balance, Decreased mobility, Decreased strength, Decreased knowledge of use of DME, Decreased activity tolerance, Decreased coordination  Visit Diagnosis: Other abnormalities of  gait and mobility  Muscle weakness (generalized)  Unsteadiness on feet  Other lack of coordination     Problem List Patient Active Problem List   Diagnosis Date Noted   Anemia in chronic kidney disease 03/05/2021   Chronic kidney disease 03/05/2021   Diabetes ([place]) 03/05/2021   Non compliance w medication regimen 03/05/2021   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

## 2021-03-26 NOTE — Therapy (Signed)
[facility] [address] [place], [place], [address] Phone: [phone]   Fax:  [phone]  Occupational Therapy Treatment  Patient Details  Name: Carlos Rice MRN: [id number] Date of Birth: 06-Sep-1970 Referring Provider (OT): Erenest Rasher f/u   Encounter Date: 03/26/2021   OT End of Session - 03/26/21 1622     Visit Number 4    Number of Visits 13    Date for OT Re-Evaluation 05/02/21    Authorization Type [employer]    Authorization Time Period $20 copay - Auth Req'd - VL:MN    OT Start Time 1618    OT Stop Time 1700    OT Time Calculation (min) 42 min    Activity Tolerance Patient tolerated treatment well    Behavior During Therapy [facility] for tasks assessed/performed             Past Medical History:  Diagnosis Date   CKD (chronic kidney disease) stage 4, GFR 15-29 ml/min (HCC)    CVA (cerebral vascular accident) ([place])    High blood pressure    History of noncompliance with medical treatment    T2DM (type 2 diabetes mellitus) ([place])     Past Surgical History:  Procedure Laterality Date   AMPUTATION TOE Right    great toe   AMPUTATION TOE Left    5th toe   DG GREAT TOE RIGHT FOOT     great toe     ROTATOR CUFF REPAIR Right 2018    There were no vitals filed for this visit.   Subjective Assessment - 03/26/21 1621     Subjective  "i went house hunting this weekend"    Pertinent History T2DM, CKD, CVA, HTN    Limitations Fall Risk. Ataxia RUE.    Patient Stated Goals "get off this walker and I just wanna walk" and then reported "I want to write" and "I want to walk around [facility]."    Currently in Pain? No/denies    Pain Score 0-No pain                          OT Treatments/Exercises (OP) - 03/26/21 1636       Hand Exercises   Other Hand Exercises Hand Gripper Level 3 with black spring with mod/max difficulty and drops      Neurological Re-education Exercises    Other Exercises 1 resistance clothespins 1-8# with RUE for increased sustained grasp and pinch and coordination with reach with [place]                    OT Education - 03/26/21 1626     Education Details Red Theraputty    Person(s) Educated Patient    Methods Explanation;Demonstration;Handout    Comprehension Verbalized understanding;Returned demonstration              OT Short Term Goals - 03/13/21 1620       OT SHORT TERM GOAL #1   Title Pt will be independent with HEP for coordination and grip strength    Time 4    Period Weeks    Status On-going    Target Date 04/04/21      OT SHORT TERM GOAL #2   Title Pt will demonstrate dynamic standing balance with no LOB in order to increase independence and safety with ADLs.    Time 4    Period Weeks    Status New  OT SHORT TERM GOAL #3   Title Pt will increase Box and Blocks score, Bilterally, by 5 blocks    Baseline R 32, L 38    Time 4    Period Weeks    Status On-going      OT SHORT TERM GOAL #4   Title Pt will demonstrate writing first and last name with 100% legibility with use of adapted strategies for ataxia    Time 4    Period Weeks    Status New               OT Long Term Goals - 03/07/21 1545       OT LONG TERM GOAL #1   Title Pt will be independent with any updated HEPs    Time 8    Period Weeks    Status New    Target Date 05/02/21      OT LONG TERM GOAL #2   Title Pt will increase coordination in RUE by completing 9 hole peg test in 75 seconds or less.    Baseline R 84s, L 45.28s    Time 8    Period Weeks    Status New      OT LONG TERM GOAL #3   Title Pt will increase legibiity of handwriting with use of strategies by writing 1-2 sentences with 100% legibility.    Time 8    Period Weeks    Status New      OT LONG TERM GOAL #4   Title Pt will verbalize understanding of adapted strategies for increasing independence and safety with ADLs and IADLs.    Time 8    Period  Weeks    Status New      OT LONG TERM GOAL #5   Title Pt will increase grip strength in RUE by 5 lbs or greater.    Baseline R 66.7, L 78    Time 8    Period Weeks    Status New                   Plan - 03/26/21 1646     Clinical Impression Statement Pt continues to progress with increased strength and coordination.    OT Occupational Profile and History Detailed Assessment- Review of Records and additional review of physical, cognitive, psychosocial history related to current functional performance    Occupational performance deficits (Please refer to evaluation for details): IADL's;ADL's;Leisure;Work    Marketing executive / Function / Physical Skills ADL;Decreased knowledge of use of DME;Strength;Coordination;FMC;Flexibility;Mobility;ROM;IADL;Vision;Proprioception;UE functional use;GMC;Dexterity    Rehab Potential Good    Clinical Decision Making Several treatment options, min-mod task modification necessary    Comorbidities Affecting Occupational Performance: May have comorbidities impacting occupational performance    Modification or Assistance to Complete Evaluation  Min-Moderate modification of tasks or assist with assess necessary to complete eval    OT Frequency 2x / week    OT Duration 8 weeks   12 visits over 8 weeks d/t any scheduling conflicts.   OT Treatment/Interventions Self-care/ADL training;Splinting;DME and/or AE instruction;Fluidtherapy;Aquatic Therapy;Therapeutic activities;Therapeutic exercise;Manual Therapy;Patient/family education;Visual/perceptual remediation/compensation;Functional Mobility Training;Passive range of motion;Neuromuscular education    Plan standing balance -dynamic for increased independence with ADLs, coordination, handwriting    Consulted and Agree with Plan of Care Patient             Patient will benefit from skilled therapeutic intervention in order to improve the following deficits and impairments:   Body Structure / Function /  Physical Skills: ADL, Decreased knowledge of use of DME, Strength, Coordination, [place], Flexibility, Mobility, ROM, IADL, Vision, Proprioception, UE functional use, GMC, Dexterity       Visit Diagnosis: Other abnormalities of gait and mobility  Muscle weakness (generalized)  Unsteadiness on feet  Other lack of coordination  Visuospatial deficit  Ataxia    Problem List Patient Active Problem List   Diagnosis Date Noted   Anemia in chronic kidney disease 03/05/2021   Chronic kidney disease 03/05/2021   Diabetes ([place]) 03/05/2021   Non compliance w medication regimen 03/05/2021   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 [place], [place], [address] Phone: [phone]   Fax:  [phone]  Name: Carlos Rice MRN: [id number] Date of Birth: 03-15-1970

## 2021-03-29 ENCOUNTER — Ambulatory Visit: Payer: Medicare HMO

## 2021-03-29 ENCOUNTER — Other Ambulatory Visit: Payer: Self-pay

## 2021-03-29 VITALS — BP 160/94

## 2021-03-29 DIAGNOSIS — R2689 Other abnormalities of gait and mobility: Secondary | ICD-10-CM | POA: Diagnosis not present

## 2021-03-29 DIAGNOSIS — R2681 Unsteadiness on feet: Secondary | ICD-10-CM

## 2021-03-29 DIAGNOSIS — M6281 Muscle weakness (generalized): Secondary | ICD-10-CM
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
## 2021-04-02 ENCOUNTER — Other Ambulatory Visit: Payer: Self-pay

## 2021-04-02 ENCOUNTER — Ambulatory Visit: Payer: Medicare HMO | Admitting: Occupational Therapy

## 2021-04-02 ENCOUNTER — Ambulatory Visit: Payer: Medicare HMO | Admitting: Physical Therapy

## 2021-04-02 ENCOUNTER — Encounter: Payer: Medicare HMO | Attending: Physical Medicine and Rehabilitation | Admitting: Physical Medicine and Rehabilitation

## 2021-04-02 ENCOUNTER — Encounter: Payer: Self-pay | Admitting: Occupational Therapy

## 2021-04-02 VITALS — BP 150/90

## 2021-04-02 DIAGNOSIS — R41842 Visuospatial deficit: Secondary | ICD-10-CM

## 2021-04-02 DIAGNOSIS — I10 Essential (primary) hypertension: Secondary | ICD-10-CM | POA: Insufficient documentation

## 2021-04-02 DIAGNOSIS — R2681 Unsteadiness on feet: Secondary | ICD-10-CM

## 2021-04-02 DIAGNOSIS — R2689 Other abnormalities of gait and mobility: Secondary | ICD-10-CM

## 2021-04-02 DIAGNOSIS — R278 Other lack of coordination: Secondary | ICD-10-CM

## 2021-04-02 DIAGNOSIS — M6281 Muscle weakness (generalized): Secondary | ICD-10-CM

## 2021-04-02 NOTE — Patient Instructions (Addendum)
Access Code: [email or web address] URL: [email or web address] Date: 04/02/2021 Prepared by: Misty Stanley  Exercises Tall Kneeling Hip Hinge - 2 x daily - 5 x weekly - 2 sets - 10 reps Tall Kneeling Diagonal Lift - 2 x daily - 5 x weekly - 2 sets - 10 reps Standing Hip Abduction -Foot Pressing Into Wall- 1 x daily - 7 x weekly - 2 sets - 10 reps

## 2021-04-02 NOTE — Therapy (Signed)
[facility] [address] [place], [place], [address] Phone: [phone]   Fax:  [phone]  Occupational Therapy Treatment  Patient Details  Name: Carlos Rice MRN: [id number] Date of Birth: 1970-09-12 Referring Provider (OT): Erenest Rasher f/u   Encounter Date: 04/02/2021   OT End of Session - 04/02/21 1540     Visit Number 5    Number of Visits 13    Date for OT Re-Evaluation 05/02/21    Authorization Type [employer]    Authorization Time Period $20 copay - Auth Req'd - VL:MN    OT Start Time 1537    OT Stop Time 1615    OT Time Calculation (min) 38 min    Activity Tolerance Patient tolerated treatment well    Behavior During Therapy [facility] for tasks assessed/performed             Past Medical History:  Diagnosis Date   CKD (chronic kidney disease) stage 4, GFR 15-29 ml/min (HCC)    CVA (cerebral vascular accident) ([place])    High blood pressure    History of noncompliance with medical treatment    T2DM (type 2 diabetes mellitus) ([place])     Past Surgical History:  Procedure Laterality Date   AMPUTATION TOE Right    great toe   AMPUTATION TOE Left    5th toe   DG GREAT TOE RIGHT FOOT     great toe     ROTATOR CUFF REPAIR Right 2018    There were no vitals filed for this visit.   Subjective Assessment - 04/02/21 1540     Subjective  "i took my dog for a walk without the walker"    Pertinent History T2DM, CKD, CVA, HTN    Limitations Fall Risk. Ataxia RUE.    Patient Stated Goals "get off this walker and I just wanna walk" and then reported "I want to write" and "I want to walk around [facility]."    Currently in Pain? No/denies    Pain Score 0-No pain                          OT Treatments/Exercises (OP) - 04/02/21 0001       Fine Motor Coordination (Hand/Wrist)   Fine Motor Coordination In hand manipuation training    In Hand Manipulation Training with pennies with  picking up stacks iwth mod drops and difficulty with RUE                      OT Short Term Goals - 03/13/21 1620       OT SHORT TERM GOAL #1   Title Pt will be independent with HEP for coordination and grip strength    Time 4    Period Weeks    Status On-going    Target Date 04/04/21      OT SHORT TERM GOAL #2   Title Pt will demonstrate dynamic standing balance with no LOB in order to increase independence and safety with ADLs.    Time 4    Period Weeks    Status New      OT SHORT TERM GOAL #3   Title Pt will increase Box and Blocks score, Bilterally, by 5 blocks    Baseline R 32, L 38    Time 4    Period Weeks    Status On-going      OT SHORT TERM GOAL #  4   Title Pt will demonstrate writing first and last name with 100% legibility with use of adapted strategies for ataxia    Time 4    Period Weeks    Status New               OT Long Term Goals - 03/07/21 1545       OT LONG TERM GOAL #1   Title Pt will be independent with any updated HEPs    Time 8    Period Weeks    Status New    Target Date 05/02/21      OT LONG TERM GOAL #2   Title Pt will increase coordination in RUE by completing 9 hole peg test in 75 seconds or less.    Baseline R 84s, L 45.28s    Time 8    Period Weeks    Status New      OT LONG TERM GOAL #3   Title Pt will increase legibiity of handwriting with use of strategies by writing 1-2 sentences with 100% legibility.    Time 8    Period Weeks    Status New      OT LONG TERM GOAL #4   Title Pt will verbalize understanding of adapted strategies for increasing independence and safety with ADLs and IADLs.    Time 8    Period Weeks    Status New      OT LONG TERM GOAL #5   Title Pt will increase grip strength in RUE by 5 lbs or greater.    Baseline R 66.7, L 78    Time 8    Period Weeks    Status New                   Plan - 04/02/21 1606     Clinical Impression Statement Pt is progressing towards goals.  Pt with good in hand manipulation but with increased drops with decreased attention.    OT Occupational Profile and History Detailed Assessment- Review of Records and additional review of physical, cognitive, psychosocial history related to current functional performance    Occupational performance deficits (Please refer to evaluation for details): IADL's;ADL's;Leisure;Work    Marketing executive / Function / Physical Skills ADL;Decreased knowledge of use of DME;Strength;Coordination;FMC;Flexibility;Mobility;ROM;IADL;Vision;Proprioception;UE functional use;GMC;Dexterity    Rehab Potential Good    Clinical Decision Making Several treatment options, min-mod task modification necessary    Comorbidities Affecting Occupational Performance: May have comorbidities impacting occupational performance    Modification or Assistance to Complete Evaluation  Min-Moderate modification of tasks or assist with assess necessary to complete eval    OT Frequency 2x / week    OT Duration 8 weeks   12 visits over 8 weeks d/t any scheduling conflicts.   OT Treatment/Interventions Self-care/ADL training;Splinting;DME and/or AE instruction;Fluidtherapy;Aquatic Therapy;Therapeutic activities;Therapeutic exercise;Manual Therapy;Patient/family education;Visual/perceptual remediation/compensation;Functional Mobility Training;Passive range of motion;Neuromuscular education    Plan handwriting, standing dynamic balance, coordination RUE    Consulted and Agree with Plan of Care Patient             Patient will benefit from skilled therapeutic intervention in order to improve the following deficits and impairments:   Body Structure / Function / Physical Skills: ADL, Decreased knowledge of use of DME, Strength, Coordination, FMC, Flexibility, Mobility, ROM, IADL, Vision, Proprioception, UE functional use, GMC, Dexterity       Visit Diagnosis: Other abnormalities of gait and mobility  Unsteadiness on feet  Other lack of  coordination  Muscle weakness (generalized)  Visuospatial deficit    Problem List Patient Active Problem List   Diagnosis Date Noted   Anemia in chronic kidney disease 03/05/2021   Chronic kidney disease 03/05/2021   Diabetes ([place]) 03/05/2021   Non compliance w medication regimen 03/05/2021   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

## 2021-04-04 NOTE — Therapy (Signed)
[facility] [address] [place], [place], [address] Phone: [phone]   Fax:  [phone]  Physical Therapy Treatment  Patient Details  Name: Carlos Rice MRN: [id number] Date of Birth: [birth year] Referring Provider (PT): Lauraine Rinne   Encounter Date: 04/02/2021   PT End of Session - 04/04/21 1537     Visit Number 5    Number of Visits 17    Date for PT Re-Evaluation 05/04/21    Authorization Type [employer]; $20 copay per discipline; 10th visit PN, 17 visits 1/18-3/17/23    Authorization Time Period 17 visits approved from 1/18 - 3/17    Authorization - Visit Number 5    Authorization - Number of Visits 17    Progress Note Due on Visit 10    PT Start Time 1627    PT Stop Time 1708    PT Time Calculation (min) 41 min    Equipment Utilized During Treatment --    Activity Tolerance Patient tolerated treatment well    Behavior During Therapy WFL for tasks assessed/performed             Past Medical History:  Diagnosis Date   CKD (chronic kidney disease) stage 4, GFR 15-29 ml/min (HCC)    CVA (cerebral vascular accident) ([place])    High blood pressure    History of noncompliance with medical treatment    T2DM (type 2 diabetes mellitus) ([place])     Past Surgical History:  Procedure Laterality Date   AMPUTATION TOE Right    great toe   AMPUTATION TOE Left    5th toe   DG GREAT TOE RIGHT FOOT     great toe     ROTATOR CUFF REPAIR Right 2018    Vitals:   04/02/21 1630  BP: (!) 150/90      04/02/21 1629  Symptoms/Limitations  Subjective Legs felt soupy after last session but it was a "good soupy".  Would like to try the treadmill again.  Today is a good day.  Pertinent History CVAx7. Did have rehab in [place] with prior but was not happy with it.  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  Pain Assessment  Currently in Pain? No/denies      04/02/21 1657  Ambulation/Gait  Gait Comments BWS over treadmill with large harness with about 30# unweighted: Performed for 5 minutes + 5 minutes at 0.7 mph with bilat UE support > one UE support > intermittent no UE support.  Therapist remained behind pt providing facilitation at pelvis for proximal stabilization, to increase stance time, and to level pelvis during R stance due to glute med weakness.  Pt able to maintain without UE support for 20-30 seconds at a time.  When PT facilitated full lateral weight shift pt demonstrated increased foot clearance.  Exercises  Exercises Other Exercises  Other Exercises  Attempted to perform modified R side plank on elbow with knees flexed for oblique and glute med activation but pt reported to much stress/strain on R shoulder.  Transitioned to standing and performed closed chain, isometric hip ABD with R foot pushing into wall x 5-6 seconds.  Performed on each side and provided for HEP.     PT Education - 04/04/21 1536     Education Details glute med strengthening in closed chain    Person(s) Educated Patient    Methods  Explanation;Demonstration;Handout    Comprehension Verbalized understanding;Returned demonstration              PT Short Term Goals - 03/08/21 1218       PT SHORT TERM GOAL #1   Title Pt will be able to perform initial HEP for strengthening and balance on own.    Time 4    Period Weeks    Status New    Target Date 04/05/21      PT SHORT TERM GOAL #2   Title Pt will decrease 5 x sit to stand from 17.96 sec to<15 sec with improved control for improved balance and functional strength.    Baseline 03/07/21 17.96 sec without hands from chair    Time 4    Period Weeks    Status New    Target Date 04/05/21      PT SHORT TERM GOAL #3   Title Pt will be able to ambulate 400' on level surfaces with walker mod I for improved household mobility and short community  distances.    Time 4    Period Weeks    Status New    Target Date 04/05/21      PT SHORT TERM GOAL #4   Title Pt will increase Berg from 28/56 to >34/56 for improved balance.    Baseline 03/07/21 28/56    Time 4    Period Weeks    Status New    Target Date 04/05/21               PT Long Term Goals - 03/08/21 1220       PT LONG TERM GOAL #1   Title Pt will be independent with progressive HEP for strengthening, balance and aerobic activity to continue gains on own.    Time 8    Period Weeks    Status New    Target Date 05/01/21      PT LONG TERM GOAL #2   Title Pt will increase gait speed to >0.45m/s for improved gait safety.    Baseline 03/07/21 0.49m/s    Time 8    Period Weeks    Status New    Target Date 05/04/21      PT LONG TERM GOAL #3   Title Pt will increase Berg from 28 to >40/56 for improved balance and decreased fall risk.    Baseline 03/07/21 28/56    Time 8    Period Weeks    Status New    Target Date 05/04/21      PT LONG TERM GOAL #4   Title Pt will ambulate >500' on varied surfaces with LRAD mod I for improved community access.    Time 8    Period Weeks    Status New    Target Date 05/04/21                   Plan - 04/04/21 1538     Clinical Impression Statement Continued to utilize body weight support over treadmill; pt able to tolerate increased time on treadmill today and was able to perform gait without UE support for longer intervals but continued to require mod-max assist and facilitation at pelvis from PT when releasing UE support.  Initiated closed chain glute med strengthening to improve stability during stance phase and assist with BOS.    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  Examination-Activity Limitations Locomotion Level;Transfers;Stairs;Stand;Dressing;Bend     Examination-Participation Restrictions Meal Prep;Cleaning;Community Activity;Yard Work    Merchant navy officer Evolving/Moderate complexity    Rehab Potential Good    PT Frequency 2x / week   plus eval   PT Duration 8 weeks    PT Treatment/Interventions ADLs/Self Care Home Management;Aquatic Therapy;DME Instruction;Gait training;Stair training;Functional mobility training;Therapeutic activities;Therapeutic exercise;Balance training;Neuromuscular re-education;Manual techniques;Passive range of motion;Vestibular;Patient/family education    PT Next Visit Plan CHECK STG.  Monitor BP closely. Continue BWS over treadmill as long as BP ok (used large harness with about 30# unweighted). Would be great to have some assistance to be able to help at legs if trial no UE support to work on more trunk control. Continue gait training with pt's new rollator. Continue to work on engaging core with balance activities with slow, controlled movements-tall kneeling and quadruped as shoulder is able to tolerate.    Consulted and Agree with Plan of Care Patient             Patient will benefit from skilled therapeutic intervention in order to improve the following deficits and impairments:  Abnormal gait, Decreased balance, Decreased mobility, Decreased strength, Decreased knowledge of use of DME, Decreased activity tolerance, Decreased coordination  Visit Diagnosis: Other abnormalities of gait and mobility  Unsteadiness on feet  Muscle weakness (generalized)  Other lack of coordination     Problem List Patient Active Problem List   Diagnosis Date Noted   Anemia in chronic kidney disease 03/05/2021   Chronic kidney disease 03/05/2021   Diabetes ([place]) 03/05/2021   Non compliance w medication regimen 03/05/2021   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 [place], [place], [address] Phone: [phone]   Fax:  [phone]  Name: Shomari Scicchitano MRN: [id number] Date of Birth: 01-17-1971

## 2021-04-05 ENCOUNTER — Other Ambulatory Visit: Payer: Self-pay

## 2021-04-05 ENCOUNTER — Encounter: Payer: Self-pay | Admitting: Occupational Therapy

## 2021-04-05 ENCOUNTER — Ambulatory Visit: Payer: Medicare HMO

## 2021-04-05 ENCOUNTER — Ambulatory Visit: Payer: Medicare HMO | Admitting: Occupational Therapy

## 2021-04-05 DIAGNOSIS — R278 Other lack of coordination: Secondary | ICD-10-CM

## 2021-04-05 DIAGNOSIS — M6281 Muscle weakness (generalized): Secondary | ICD-10-CM

## 2021-04-05 DIAGNOSIS — R2681 Unsteadiness on feet: Secondary | ICD-10-CM

## 2021-04-05 DIAGNOSIS — R270 Ataxia, unspecified: Secondary | ICD-10-CM

## 2021-04-05 DIAGNOSIS — R41842 Visuospatial deficit: Secondary | ICD-10-CM

## 2021-04-05 DIAGNOSIS — R2689 Other abnormalities of gait and mobility: Secondary | ICD-10-CM | POA: Diagnosis not present

## 2021-04-05 NOTE — Therapy (Signed)
[facility] [address] [place], [place], [address] Phone: [phone]   Fax:  [phone]  Occupational Therapy Treatment  Patient Details  Name: Carlos Rice MRN: [id number] Date of Birth: Oct 14, 1970 Referring Provider (OT): Erenest Rasher f/u   Encounter Date: 04/05/2021   OT End of Session - 04/05/21 1619     Visit Number 6    Number of Visits 13    Date for OT Re-Evaluation 05/02/21    Authorization Type [employer]    Authorization Time Period $20 copay - Auth Req'd - VL:MN    OT Start Time 1616    OT Stop Time 1655    OT Time Calculation (min) 39 min    Activity Tolerance Patient tolerated treatment well    Behavior During Therapy [facility] for tasks assessed/performed             Past Medical History:  Diagnosis Date   CKD (chronic kidney disease) stage 4, GFR 15-29 ml/min (HCC)    CVA (cerebral vascular accident) ([place])    High blood pressure    History of noncompliance with medical treatment    T2DM (type 2 diabetes mellitus) ([place])     Past Surgical History:  Procedure Laterality Date   AMPUTATION TOE Right    great toe   AMPUTATION TOE Left    5th toe   DG GREAT TOE RIGHT FOOT     great toe     ROTATOR CUFF REPAIR Right 2018    There were no vitals filed for this visit.   Subjective Assessment - 04/05/21 1619     Subjective  "I can't believe I missed a whole session - I need these sessions"    Pertinent History T2DM, CKD, CVA, HTN    Limitations Fall Risk. Ataxia RUE.    Patient Stated Goals "get off this walker and I just wanna walk" and then reported "I want to write" and "I want to walk around [facility]."    Currently in Pain? No/denies    Pain Score 0-No pain                checked goals.  Grooved Pegs with RUE with increased time and mod difficulty and mod drops. Removed with tweezers.                     OT Short Term Goals - 04/05/21 1622        OT SHORT TERM GOAL #1   Title Pt will be independent with HEP for coordination and grip strength    Time 4    Period Weeks    Status Achieved    Target Date 04/04/21      OT SHORT TERM GOAL #2   Title Pt will demonstrate dynamic standing balance with no LOB in order to increase independence and safety with ADLs.    Time 4    Period Weeks    Status On-going   pt continues to be unsteady while standing d/t ataxia     OT SHORT TERM GOAL #3   Title Pt will increase Box and Blocks score, Bilterally, by 5 blocks    Baseline R 32, L 38    Time 4    Period Weeks    Status On-going   L 35, R 32 04/05/21     OT SHORT TERM GOAL #4   Title Pt will demonstrate writing first and last name with 100% legibility with use of  adapted strategies for ataxia    Time 4    Period Weeks    Status Achieved               OT Long Term Goals - 03/07/21 1545       OT LONG TERM GOAL #1   Title Pt will be independent with any updated HEPs    Time 8    Period Weeks    Status New    Target Date 05/02/21      OT LONG TERM GOAL #2   Title Pt will increase coordination in RUE by completing 9 hole peg test in 75 seconds or less.    Baseline R 84s, L 45.28s    Time 8    Period Weeks    Status New      OT LONG TERM GOAL #3   Title Pt will increase legibiity of handwriting with use of strategies by writing 1-2 sentences with 100% legibility.    Time 8    Period Weeks    Status New      OT LONG TERM GOAL #4   Title Pt will verbalize understanding of adapted strategies for increasing independence and safety with ADLs and IADLs.    Time 8    Period Weeks    Status New      OT LONG TERM GOAL #5   Title Pt will increase grip strength in RUE by 5 lbs or greater.    Baseline R 66.7, L 78    Time 8    Period Weeks    Status New                   Plan - 04/05/21 1629     Clinical Impression Statement Pt has met 2/4 STGs at goal check today.    OT Occupational Profile and History  Detailed Assessment- Review of Records and additional review of physical, cognitive, psychosocial history related to current functional performance    Occupational performance deficits (Please refer to evaluation for details): IADL's;ADL's;Leisure;Work    Marketing executive / Function / Physical Skills ADL;Decreased knowledge of use of DME;Strength;Coordination;FMC;Flexibility;Mobility;ROM;IADL;Vision;Proprioception;UE functional use;GMC;Dexterity    Rehab Potential Good    Clinical Decision Making Several treatment options, min-mod task modification necessary    Comorbidities Affecting Occupational Performance: May have comorbidities impacting occupational performance    Modification or Assistance to Complete Evaluation  Min-Moderate modification of tasks or assist with assess necessary to complete eval    OT Frequency 2x / week    OT Duration 8 weeks   12 visits over 8 weeks d/t any scheduling conflicts.   OT Treatment/Interventions Self-care/ADL training;Splinting;DME and/or AE instruction;Fluidtherapy;Aquatic Therapy;Therapeutic activities;Therapeutic exercise;Manual Therapy;Patient/family education;Visual/perceptual remediation/compensation;Functional Mobility Training;Passive range of motion;Neuromuscular education    Plan handwriting, standing dynamic balance, coordination RUE    Consulted and Agree with Plan of Care Patient             Patient will benefit from skilled therapeutic intervention in order to improve the following deficits and impairments:   Body Structure / Function / Physical Skills: ADL, Decreased knowledge of use of DME, Strength, Coordination, FMC, Flexibility, Mobility, ROM, IADL, Vision, Proprioception, UE functional use, GMC, Dexterity       Visit Diagnosis: Unsteadiness on feet  Other lack of coordination  Muscle weakness (generalized)  Visuospatial deficit  Ataxia    Problem List Patient Active Problem List   Diagnosis Date Noted   Anemia in chronic  kidney disease 03/05/2021   Chronic  kidney disease 03/05/2021   Diabetes ([place]) 03/05/2021   Non compliance w medication regimen 03/05/2021   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 [place], [place], [address] Phone: [phone]   Fax:  [phone]  Name: Wei Poplaski MRN: [id number] Date of Birth: 04/14/1970

## 2021-04-05 NOTE — Patient Instructions (Signed)
1. Grip Strengthening (Resistive Putty)   Squeeze putty using thumb and all fingers. Repeat _20___ times. Do __2__ sessions per day.   2. Roll putty into tube on table and pinch between each finger and thumb x 10 reps each. (can do ring and small finger together)     Copyright  VHI. All rights reserved.   

## 2021-04-09 ENCOUNTER — Ambulatory Visit: Payer: Medicare HMO | Admitting: Physical Therapy

## 2021-04-09 ENCOUNTER — Encounter: Payer: Self-pay | Admitting: Occupational Therapy

## 2021-04-09 ENCOUNTER — Ambulatory Visit: Payer: Medicare HMO | Admitting: Occupational Therapy

## 2021-04-09 ENCOUNTER — Other Ambulatory Visit: Payer: Self-pay

## 2021-04-09 DIAGNOSIS — R270 Ataxia, unspecified: Secondary | ICD-10-CM

## 2021-04-09 DIAGNOSIS — R2689 Other abnormalities of gait and mobility: Secondary | ICD-10-CM | POA: Diagnosis not present

## 2021-04-09 DIAGNOSIS — R2681 Unsteadiness on feet: Secondary | ICD-10-CM

## 2021-04-09 DIAGNOSIS — M6281 Muscle weakness (generalized): Secondary | ICD-10-CM

## 2021-04-09 DIAGNOSIS — R41842 Visuospatial deficit: Secondary | ICD-10-CM

## 2021-04-09 DIAGNOSIS — R278 Other lack of coordination: Secondary | ICD-10-CM

## 2021-04-09 NOTE — Therapy (Signed)
[name] [address] [place], [place], [address] Phone: [phone]   Fax:  [phone]  Physical Therapy Treatment  Patient Details  Name: Carlos Rice MRN: [id number] Date of Birth: [birth year] Referring Provider (PT): Lauraine Rinne   Encounter Date: 04/09/2021   PT End of Session - 04/09/21 2015     Visit Number 6    Number of Visits 17    Date for PT Re-Evaluation 05/04/21    Authorization Type [employer]; $20 copay per discipline; 10th visit PN, 17 visits 1/18-3/17/23    Authorization Time Period 17 visits approved from 1/18 - 3/17    Authorization - Visit Number 6    Authorization - Number of Visits 17    Progress Note Due on Visit 10    PT Start Time 1536    PT Stop Time 1615    PT Time Calculation (min) 39 min    Activity Tolerance Patient tolerated treatment well    Behavior During Therapy [facility] for tasks assessed/performed             Past Medical History:  Diagnosis Date   CKD (chronic kidney disease) stage 4, GFR 15-29 ml/min (HCC)    CVA (cerebral vascular accident) ([place])    High blood pressure    History of noncompliance with medical treatment    T2DM (type 2 diabetes mellitus) ([place])     Past Surgical History:  Procedure Laterality Date   AMPUTATION TOE Right    great toe   AMPUTATION TOE Left    5th toe   DG GREAT TOE RIGHT FOOT     great toe     ROTATOR CUFF REPAIR Right 2018    There were no vitals filed for this visit.   Subjective Assessment - 04/09/21 1539     Subjective Woke up early this morning but having a good day; no pain.  Tried the hip exercise at home but isn't sure if he is doing something wrong; doesn't feel it like he thinks he should.    Pertinent History CVAx7. Did have rehab in [place] with prior but was not happy with it.  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    Currently in Pain? No/denies                [facility] PT Assessment - 04/09/21 1542       Assessment   Medical Diagnosis ICH    Referring Provider (PT) Lauraine Rinne    Onset Date/Surgical Date 02/17/21    Hand Dominance Right      Precautions   Precautions Fall      Standardized Balance Assessment   Standardized Balance Assessment Berg Balance Test;Five Times Sit to Stand    Five times sit to stand comments  23.66 sec w/ legs hitting EOM during rise to standing; 21.84 sec with improved forward lean, legs hitting EOM intermittently      Berg Balance Test   Sit to Stand Able to stand  independently using hands    Standing Unsupported Able to stand 2 minutes with supervision    Sitting with Back Unsupported but Feet Supported on Floor or Stool Able to sit safely and securely 2 minutes    Stand to Sit Controls descent by using hands    Transfers Able to transfer with verbal cueing and /or supervision  Standing Unsupported with Eyes Closed Able to stand 10 seconds with supervision    Standing Unsupported with Feet Together Able to place feet together independently but unable to hold for 30 seconds    From Standing, Reach Forward with Outstretched Arm Can reach confidently >25 cm (10")    From Standing Position, Pick up Object from Floor Able to pick up shoe, needs supervision   tissue box, uses legs against mat on return to standing   From Standing Position, Turn to Look Behind Over each Shoulder Looks behind one side only/other side shows less weight shift   LOB w/ R side weight shift   Turn 360 Degrees Needs assistance while turning    Standing Unsupported, Alternately Place Feet on Step/Stool Able to complete >2 steps/needs minimal assist    Standing Unsupported, One Foot in Front Loses balance while stepping or standing    Standing on One Leg Unable to try or needs assist to prevent fall    Total Score 31               PT Education -  04/09/21 2014     Education Details progress towards goals; areas to continue to focus on based on STG check    Person(s) Educated Patient    Methods Explanation    Comprehension Verbalized understanding              PT Short Term Goals - 04/09/21 1614       PT SHORT TERM GOAL #1   Title Pt will be able to perform initial HEP for strengthening and balance on own.    Time 4    Period Weeks    Status Achieved    Target Date 04/05/21      PT SHORT TERM GOAL #2   Title Pt will decrease 5 x sit to stand from 17.96 sec to<15 sec with improved control for improved balance and functional strength.    Baseline 03/07/21 17.96 sec without hands from chair > 21 seconds without UE from mat    Time 4    Period Weeks    Status Not Met    Target Date 04/05/21      PT SHORT TERM GOAL #3   Title Pt will be able to ambulate 400' on level surfaces with walker mod I for improved household mobility and short community distances.    Time 4    Period Weeks    Status On-going    Target Date 04/05/21      PT SHORT TERM GOAL #4   Title Pt will increase Berg from 28/56 to >34/56 for improved balance.    Baseline 03/07/21 28/56 > 31/56    Time 4    Period Weeks    Status Partially Met    Target Date 04/05/21               PT Long Term Goals - 03/08/21 1220       PT LONG TERM GOAL #1   Title Pt will be independent with progressive HEP for strengthening, balance and aerobic activity to continue gains on own.    Time 8    Period Weeks    Status New    Target Date 05/01/21      PT LONG TERM GOAL #2   Title Pt will increase gait speed to >0.85ms for improved gait safety.    Baseline 03/07/21 0.390m    Time 8    Period Weeks    Status  New    Target Date 05/04/21      PT LONG TERM GOAL #3   Title Pt will increase Berg from 28 to >40/56 for improved balance and decreased fall risk.    Baseline 03/07/21 28/56    Time 8    Period Weeks    Status New    Target Date 05/04/21       PT LONG TERM GOAL #4   Title Pt will ambulate >500' on varied surfaces with LRAD mod I for improved community access.    Time 8    Period Weeks    Status New    Target Date 05/04/21                   Plan - 04/09/21 2016     Clinical Impression Statement Assessed patient's progress towards STG.  Pt is making slow but steady progress and has met 1 STG; pt is performing HEP consistently and independently; pt partially met BERG goal with improvement to 31/56 but not to goal level; pt did not meet 5 time sit to stand goal and required increased time to perform with multiple posterior LOB and wider BOS.  Will assess walking goal at next session.  Pt continues to demonstrate significant truncal and limb ataxia and impaired grading of movement.  Will continue to address and progress towards LTG.    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    Examination-Activity Limitations Locomotion Level;Transfers;Stairs;Stand;Dressing;Bend    Examination-Participation Restrictions Meal Prep;Cleaning;Community Activity;Yard Work    Merchant navy officer Evolving/Moderate complexity    Rehab Potential Good    PT Frequency 2x / week   plus eval   PT Duration 8 weeks    PT Treatment/Interventions ADLs/Self Care Home Management;Aquatic Therapy;DME Instruction;Gait training;Stair training;Functional mobility training;Therapeutic activities;Therapeutic exercise;Balance training;Neuromuscular re-education;Manual techniques;Passive range of motion;Vestibular;Patient/family education    PT Next Visit Plan Check final STG: ambulationg 400'.  Monitor BP closely. Continue BWS over treadmill as long as BP ok (used large harness with about 30# unweighted). Would be great to have some assistance to be able to help at legs if trial no UE support to work on more  trunk control. Continue gait training with pt's new rollator. Continue to work on engaging core with balance activities with slow, controlled movements-tall kneeling and quadruped as shoulder is able to tolerate.    Consulted and Agree with Plan of Care Patient             Patient will benefit from skilled therapeutic intervention in order to improve the following deficits and impairments:  Abnormal gait, Decreased balance, Decreased mobility, Decreased strength, Decreased knowledge of use of DME, Decreased activity tolerance, Decreased coordination  Visit Diagnosis: Unsteadiness on feet  Other lack of coordination  Muscle weakness (generalized)  Other abnormalities of gait and mobility  Ataxia     Problem List Patient Active Problem List   Diagnosis Date Noted   Anemia in chronic kidney disease 03/05/2021   Chronic kidney disease 03/05/2021   Diabetes ([place]) 03/05/2021   Non compliance w medication regimen 03/05/2021   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

## 2021-04-09 NOTE — Therapy (Signed)
[facility] [address] [place], [place], [address] Phone: [phone]   Fax:  [phone]  Occupational Therapy Treatment  Patient Details  Name: Carlos Rice MRN: [id number] Date of Birth: [birth year] Referring Provider (OT): Erenest Rasher f/u   Encounter Date: 04/09/2021   OT End of Session - 04/09/21 1627     Visit Number 7    Number of Visits 13    Date for OT Re-Evaluation 05/02/21    Authorization Type [employer]    Authorization Time Period $20 copay - Auth Req'd - VL:MN    OT Start Time 1618    OT Stop Time 1700    OT Time Calculation (min) 42 min    Activity Tolerance Patient tolerated treatment well    Behavior During Therapy [facility] for tasks assessed/performed             Past Medical History:  Diagnosis Date   CKD (chronic kidney disease) stage 4, GFR 15-29 ml/min (HCC)    CVA (cerebral vascular accident) ([place])    High blood pressure    History of noncompliance with medical treatment    T2DM (type 2 diabetes mellitus) ([place])     Past Surgical History:  Procedure Laterality Date   AMPUTATION TOE Right    great toe   AMPUTATION TOE Left    5th toe   DG GREAT TOE RIGHT FOOT     great toe     ROTATOR CUFF REPAIR Right 2018    There were no vitals filed for this visit.   Subjective Assessment - 04/09/21 1623     Subjective  "I don't have enough shoulder strength to do this exercise (modified side plank)"    Pertinent History T2DM, CKD, CVA, HTN    Limitations Fall Risk. Ataxia RUE.    Patient Stated Goals "get off this walker and I just wanna walk" and then reported "I want to write" and "I want to walk around [facility]."    Currently in Pain? No/denies                physioball - forward reaching  x 10 reps with working on RUE shoulder range of motion.   Spring Rod with RUE for increased resistance for RUE for ataxia and for strengthening  Resistance [employer] 1-8#  with RUE for shoulder endurance with no complaints of fatigue or pain.                   OT Education - 04/09/21 1651     Education Details seratus punches in sitting - yellow theraband    Person(s) Educated Patient    Methods Explanation;Demonstration;Handout    Comprehension Verbalized understanding;Returned demonstration;Need further instruction              OT Short Term Goals - 04/05/21 1622       OT SHORT TERM GOAL #1   Title Pt will be independent with HEP for coordination and grip strength    Time 4    Period Weeks    Status Achieved    Target Date 04/04/21      OT SHORT TERM GOAL #2   Title Pt will demonstrate dynamic standing balance with no LOB in order to increase independence and safety with ADLs.    Time 4    Period Weeks    Status On-going   pt continues to be unsteady while standing d/t ataxia     OT SHORT TERM GOAL #  3   Title Pt will increase Box and Blocks score, Bilterally, by 5 blocks    Baseline R 32, L 38    Time 4    Period Weeks    Status On-going   L 35, R 32 04/05/21     OT SHORT TERM GOAL #4   Title Pt will demonstrate writing first and last name with 100% legibility with use of adapted strategies for ataxia    Time 4    Period Weeks    Status Achieved               OT Long Term Goals - 03/07/21 1545       OT LONG TERM GOAL #1   Title Pt will be independent with any updated HEPs    Time 8    Period Weeks    Status New    Target Date 05/02/21      OT LONG TERM GOAL #2   Title Pt will increase coordination in RUE by completing 9 hole peg test in 75 seconds or less.    Baseline R 84s, L 45.28s    Time 8    Period Weeks    Status New      OT LONG TERM GOAL #3   Title Pt will increase legibiity of handwriting with use of strategies by writing 1-2 sentences with 100% legibility.    Time 8    Period Weeks    Status New      OT LONG TERM GOAL #4   Title Pt will verbalize understanding of adapted strategies for  increasing independence and safety with ADLs and IADLs.    Time 8    Period Weeks    Status New      OT LONG TERM GOAL #5   Title Pt will increase grip strength in RUE by 5 lbs or greater.    Baseline R 66.7, L 78    Time 8    Period Weeks    Status New                   Plan - 04/09/21 1646     Clinical Impression Statement Pt progressing towards goals.    OT Occupational Profile and History Detailed Assessment- Review of Records and additional review of physical, cognitive, psychosocial history related to current functional performance    Occupational performance deficits (Please refer to evaluation for details): IADL's;ADL's;Leisure;Work    Marketing executive / Function / Physical Skills ADL;Decreased knowledge of use of DME;Strength;Coordination;FMC;Flexibility;Mobility;ROM;IADL;Vision;Proprioception;UE functional use;GMC;Dexterity    Rehab Potential Good    Clinical Decision Making Several treatment options, min-mod task modification necessary    Comorbidities Affecting Occupational Performance: May have comorbidities impacting occupational performance    Modification or Assistance to Complete Evaluation  Min-Moderate modification of tasks or assist with assess necessary to complete eval    OT Frequency 2x / week    OT Duration 8 weeks   12 visits over 8 weeks d/t any scheduling conflicts.   OT Treatment/Interventions Self-care/ADL training;Splinting;DME and/or AE instruction;Fluidtherapy;Aquatic Therapy;Therapeutic activities;Therapeutic exercise;Manual Therapy;Patient/family education;Visual/perceptual remediation/compensation;Functional Mobility Training;Passive range of motion;Neuromuscular education    Plan handwriting, standing dynamic balance, coordination RUE    Consulted and Agree with Plan of Care Patient             Patient will benefit from skilled therapeutic intervention in order to improve the following deficits and impairments:   Body Structure /  Function / Physical Skills: ADL, Decreased knowledge of use  of DME, Strength, Coordination, [place], Flexibility, Mobility, ROM, IADL, Vision, Proprioception, UE functional use, GMC, Dexterity       Visit Diagnosis: Unsteadiness on feet  Muscle weakness (generalized)  Other lack of coordination  Visuospatial deficit  Ataxia    Problem List Patient Active Problem List   Diagnosis Date Noted   Anemia in chronic kidney disease 03/05/2021   Chronic kidney disease 03/05/2021   Diabetes ([place]) 03/05/2021   Non compliance w medication regimen 03/05/2021   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

## 2021-04-12 ENCOUNTER — Other Ambulatory Visit: Payer: Self-pay | Admitting: Physical Medicine and Rehabilitation

## 2021-04-12 ENCOUNTER — Other Ambulatory Visit: Payer: Self-pay

## 2021-04-12 ENCOUNTER — Ambulatory Visit: Payer: Medicare HMO | Admitting: Physical Therapy

## 2021-04-12 ENCOUNTER — Ambulatory Visit: Payer: Medicare HMO | Admitting: Occupational Therapy

## 2021-04-12 ENCOUNTER — Encounter (HOSPITAL_BASED_OUTPATIENT_CLINIC_OR_DEPARTMENT_OTHER): Payer: Medicare HMO | Admitting: Physical Medicine and Rehabilitation

## 2021-04-12 VITALS — BP 210/110

## 2021-04-12 DIAGNOSIS — R2681 Unsteadiness on feet: Secondary | ICD-10-CM

## 2021-04-12 DIAGNOSIS — I10 Essential (primary) hypertension: Secondary | ICD-10-CM

## 2021-04-12 DIAGNOSIS — M6281 Muscle weakness (generalized): Secondary | ICD-10-CM

## 2021-04-12 DIAGNOSIS — R278 Other lack of coordination: Secondary | ICD-10-CM

## 2021-04-12 MED ORDER — CLONIDINE HCL 0.1 MG/24HR TD PTWK: 0.1000 mg | MEDICATED_PATCH | TRANSDERMAL | 11 refills | Status: DC

## 2021-04-12 MED ORDER — AMLODIPINE BESYLATE 10 MG PO TABS: 10.0000 mg | ORAL_TABLET | Freq: Every day | ORAL | 0 refills | Status: DC

## 2021-04-12 NOTE — Therapy (Signed)
[facility] [address] [place], [place], [address] Phone: [phone]   Fax:  [phone]  Physical Therapy Treatment/ Arrived No Charge  Patient Details  Name: Carlos Rice MRN: [id number] Date of Birth: 03-11-1970 Referring Provider (PT): Lauraine Rinne   Encounter Date: 04/12/2021   PT End of Session - 04/12/21 1510     Visit Number 6    Number of Visits 17    Date for PT Re-Evaluation 05/04/21    Authorization Type [employer]; $20 copay per discipline; 10th visit PN, 17 visits 1/18-3/17/23    Authorization Time Period 17 visits approved from 1/18 - 3/17    Authorization - Visit Number 6    Authorization - Number of Visits 17    Progress Note Due on Visit 10    PT Start Time 1458    PT Stop Time 1510    PT Time Calculation (min) 12 min    Activity Tolerance Patient tolerated treatment well    Behavior During Therapy [facility] for tasks assessed/performed             Past Medical History:  Diagnosis Date   CKD (chronic kidney disease) stage 4, GFR 15-29 ml/min (HCC)    CVA (cerebral vascular accident) ([place])    High blood pressure    History of noncompliance with medical treatment    T2DM (type 2 diabetes mellitus) ([place])     Past Surgical History:  Procedure Laterality Date   AMPUTATION TOE Right    great toe   AMPUTATION TOE Left    5th toe   DG GREAT TOE RIGHT FOOT     great toe     ROTATOR CUFF REPAIR Right 2018    Vitals:   04/12/21 1504  BP: (!) 210/110     Subjective Assessment - 04/12/21 1451     Subjective Reports not using rollator at home - taking dog outside without AD and furniture walking. No pain    Pertinent History CVAx7. Did have rehab in [place] with prior but was not happy with it.  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    Currently in Pain? No/denies                                         PT Education - 04/12/21 1515     Education Details Educated pt on BEFAST and encouraged pt to monitor BP at home    Person(s) Educated Patient    Methods Explanation    Comprehension Verbalized understanding              PT Short Term Goals - 04/09/21 1614       PT SHORT TERM GOAL #1   Title Pt will be able to perform initial HEP for strengthening and balance on own.    Time 4    Period Weeks    Status Achieved    Target Date 04/05/21      PT SHORT TERM GOAL #2   Title Pt will decrease 5 x sit to stand from 17.96 sec to<15 sec with improved control for improved balance and functional strength.    Baseline 03/07/21 17.96 sec without hands from chair > 21 seconds without UE from mat    Time  4    Period Weeks    Status Not Met    Target Date 04/05/21      PT SHORT TERM GOAL #3   Title Pt will be able to ambulate 400' on level surfaces with walker mod I for improved household mobility and short community distances.    Time 4    Period Weeks    Status On-going    Target Date 04/05/21      PT SHORT TERM GOAL #4   Title Pt will increase Berg from 28/56 to >34/56 for improved balance.    Baseline 03/07/21 28/56 > 31/56    Time 4    Period Weeks    Status Partially Met    Target Date 04/05/21               PT Long Term Goals - 03/08/21 1220       PT LONG TERM GOAL #1   Title Pt will be independent with progressive HEP for strengthening, balance and aerobic activity to continue gains on own.    Time 8    Period Weeks    Status New    Target Date 05/01/21      PT LONG TERM GOAL #2   Title Pt will increase gait speed to >0.53m/s for improved gait safety.    Baseline 03/07/21 0.2m/s    Time 8    Period Weeks    Status New    Target Date 05/04/21      PT LONG TERM GOAL #3   Title Pt will increase Berg from 28 to >40/56 for improved balance and decreased fall risk.    Baseline 03/07/21 28/56     Time 8    Period Weeks    Status New    Target Date 05/04/21      PT LONG TERM GOAL #4   Title Pt will ambulate >500' on varied surfaces with LRAD mod I for improved community access.    Time 8    Period Weeks    Status New    Target Date 05/04/21                   Plan - 04/12/21 1511     Clinical Impression Statement Session postponed due to pt's BP of 210/110 mmHg, taken manually x3. Pt reports he ran out of Clonidine patch 2 days ago and does not have an updated script. Contacted Dr. Ranell Patrick to obtain new script and encouraged pt to go to ED to assist in lowering BP, which he adamantly refused. Informed pt of risk of another CVA, pt verbalized understanding.    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    Examination-Activity Limitations Locomotion Level;Transfers;Stairs;Stand;Dressing;Bend    Examination-Participation Restrictions Meal Prep;Cleaning;Community Activity;Yard Work    [employer] Evolving/Moderate complexity    Rehab Potential Good    PT Frequency 2x / week   plus eval   PT Duration 8 weeks    PT Treatment/Interventions ADLs/Self Care Home Management;Aquatic Therapy;DME Instruction;Gait training;Stair training;Functional mobility training;Therapeutic activities;Therapeutic exercise;Balance training;Neuromuscular re-education;Manual techniques;Passive range of motion;Vestibular;Patient/family education    PT Next Visit Plan Check final STG: ambulationg 400'.  Monitor BP closely. Continue BWS over treadmill as long as BP ok (used large harness with about 30# unweighted). Would be great to have some assistance to be able to help at legs if trial no UE  support to work on more trunk control. Continue gait training with pt's new rollator. Continue to work on engaging core with balance activities  with slow, controlled movements-tall kneeling and quadruped as shoulder is able to tolerate.    Consulted and Agree with Plan of Care Patient             Patient will benefit from skilled therapeutic intervention in order to improve the following deficits and impairments:  Abnormal gait, Decreased balance, Decreased mobility, Decreased strength, Decreased knowledge of use of DME, Decreased activity tolerance, Decreased coordination  Visit Diagnosis: Muscle weakness (generalized)  Unsteadiness on feet  Other lack of coordination     Problem List Patient Active Problem List   Diagnosis Date Noted   Anemia in chronic kidney disease 03/05/2021   Chronic kidney disease 03/05/2021   Diabetes ([place]) 03/05/2021   Non compliance w medication regimen 03/05/2021   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 [place], [place], [address] Phone: [phone]   Fax:  [phone]  Name: Calem Cocozza MRN: [id number] Date of Birth: Dec 28, 1970

## 2021-04-13 NOTE — Progress Notes (Signed)
° °  Subjective:    Patient ID: Carlos Rice, male    DOB: Apr 09, 1970, 51 y.o.   MRN: [id number]  HPI An audio/video tele-health visit is felt to be the most appropriate encounter for this patient at this time. This is a follow up tele-visit via phone. The patient is at home. MD is at office. Prior to scheduling this appointment, our staff discussed the limitations of evaluation and management by telemedicine and the availability of in-person appointments. The patient expressed understanding and agreed to proceed.   Carlos Rice is a 51 year old man presenting for f/u of ICH, impaired mobility, and HTN  1) ICH -started outpatient therapy and had a good eval day -he hopes they work him as hard as he worked in inpatient rehab -he feels he is slowed down by his current walker and would prefer a rolling walker instead- placed order, he asks how much cost will be and how soon he can get this -he was able to bike 2 miles and his son felt this was too much.  -he has been able to take 5 steps without his walker -he has tried applying tennis balls to his walker and this hasn't helped! -has not received the walker yet.  -missed therapy yesterday since his mother did not want to drive in the heavy rain -he has been doing core and leg exercises  2) HTN -His BP had improved to 140s/96, but approached systolic 196Q today as he is out of his clonidine patches and amlodipine.  -he just got kiwis -he has been trying juicing.     Review of Systems +impaired balance and coordination    Objective:   Physical Exam Not performed as seen via phone        Assessment & Plan:  1) ICH -prescribed rolling walker, advised he call clinic to check with Carlos Rice on status of referral, discussed that company would let him know potential cost before selling it to him.  -continue outpatient therapy -commended on bike riding and encouraged exercise  2) HTN -encouraged him to keep blood pressure log to share  with me -recommended eating 1 kiwi daily.  -continue juicing, BP improving -sent in clonodine patch and amlodipine for him today- discussed with Carlos Rice his outpatient therapist  3) Atherosclerosis -encouraged coQ10 as recommended by his holistic practitioner.  5 minutes spent in discussion of his elevated Bps, that he ran out of clonidine patch and amlodipine, sending refills for both

## 2021-04-16 ENCOUNTER — Other Ambulatory Visit: Payer: Self-pay

## 2021-04-16 ENCOUNTER — Ambulatory Visit: Payer: Medicare HMO | Admitting: Physical Therapy

## 2021-04-16 ENCOUNTER — Ambulatory Visit: Payer: Medicare HMO | Admitting: Occupational Therapy

## 2021-04-16 ENCOUNTER — Encounter: Payer: Self-pay | Admitting: Occupational Therapy

## 2021-04-16 VITALS — BP 160/90 | HR 68

## 2021-04-16 DIAGNOSIS — R270 Ataxia, unspecified: Secondary | ICD-10-CM

## 2021-04-16 DIAGNOSIS — R2681 Unsteadiness on feet: Secondary | ICD-10-CM

## 2021-04-16 DIAGNOSIS — R41842 Visuospatial deficit: Secondary | ICD-10-CM

## 2021-04-16 DIAGNOSIS — R2689 Other abnormalities of gait and mobility: Secondary | ICD-10-CM

## 2021-04-16 DIAGNOSIS — R278 Other lack of coordination: Secondary | ICD-10-CM

## 2021-04-16 DIAGNOSIS — M6281 Muscle weakness (generalized): Secondary | ICD-10-CM

## 2021-04-16 NOTE — Therapy (Signed)
[facility] [address] [place], [place], [address] Phone: [phone]   Fax:  [phone]  Physical Therapy Treatment  Patient Details  Name: Carlos Rice MRN: [id number] Date of Birth: 25-Jun-1970 Referring Provider (PT): Lauraine Rinne   Encounter Date: 04/16/2021   PT End of Session - 04/16/21 1636     Visit Number 7    Number of Visits 17    Date for PT Re-Evaluation 05/04/21    Authorization Type [employer]; $20 copay per discipline; 10th visit PN, 17 visits 1/18-3/17/23    Authorization Time Period 17 visits approved from 1/18 - 3/17    Authorization - Visit Number 8    Authorization - Number of Visits 17    Progress Note Due on Visit 10    PT Start Time 3532    PT Stop Time 1615    PT Time Calculation (min) 40 min    Activity Tolerance Patient tolerated treatment well    Behavior During Therapy [facility] for tasks assessed/performed             Past Medical History:  Diagnosis Date   CKD (chronic kidney disease) stage 4, GFR 15-29 ml/min (HCC)    CVA (cerebral vascular accident) ([place])    High blood pressure    History of noncompliance with medical treatment    T2DM (type 2 diabetes mellitus) ([place])     Past Surgical History:  Procedure Laterality Date   AMPUTATION TOE Right    great toe   AMPUTATION TOE Left    5th toe   DG GREAT TOE RIGHT FOOT     great toe     ROTATOR CUFF REPAIR Right 2018    Vitals:   04/16/21 1542  BP: (!) 160/90  Pulse: 68  SpO2: 98%     Subjective Assessment - 04/16/21 1542     Subjective Picked up BP medication; BP better today.    Pertinent History CVAx7. Did have rehab in [place] with prior but was not happy with it.  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    Currently in Pain? No/denies               [facility] Adult PT Treatment/Exercise  - 04/16/21 1554       Transfers   Transfers Sit to Stand;Stand to Sit    Sit to Stand 4: Min guard    Sit to Stand Details (indicate cue type and reason) continues to stand by pulling up on rollator and pushing back of knees against mat to shift weight forwards over BOS    Stand to Sit 4: Min guard      Ambulation/Gait   Ambulation/Gait Yes    Ambulation/Gait Assistance 4: Min assist;3: Mod assist    Ambulation/Gait Assistance Details 400' around gym on level surfaces.  PT assistance required to slow down rollator due to COG shifting forwards to feet causing shuffling; cues to keep R shoulder depressed and to utilize trunk muscles to keep upright posture.    Ambulation Distance (Feet) 400 Feet    Assistive device Rollator      Exercises   Exercises Knee/Hip      Knee/Hip Exercises: Standing   Functional Squat 3 sets    Functional Squat Limitations sit <> squat from mat with UE support on chair in front of patient with focus on  sliding hands and COG forwards over BOS without pulling to squat.  Once in sustained squat performed large, medium, small range repetitive squats x 10 reps.  Performed sustained squat with isolated weight shifting on R and LLE and then alternating weight shift R and L with contralateral LE step forwards x 5 reps each side and 5 reps alternating.  Also performed sustained squat with weight shift and contralateral LE step out to the side x 5 reps.  Turned and placed hands on mat and maintained squat while performing lateral stepping to L and R along mat with UE support on mat and then without UE support on mat with mod A from PT to maintain balance and prevent forwards LOB.               PT Education - 04/16/21 1635     Education Details how to safely perform sustained squat at home but advised not to do side stepping or release UE support    Person(s) Educated Patient    Methods Explanation    Comprehension Verbalized understanding              PT  Short Term Goals - 04/16/21 1636       PT SHORT TERM GOAL #1   Title Pt will be able to perform initial HEP for strengthening and balance on own.    Time 4    Period Weeks    Status Achieved    Target Date 04/05/21      PT SHORT TERM GOAL #2   Title Pt will decrease 5 x sit to stand from 17.96 sec to<15 sec with improved control for improved balance and functional strength.    Baseline 03/07/21 17.96 sec without hands from chair > 21 seconds without UE from mat    Time 4    Period Weeks    Status Not Met    Target Date 04/05/21      PT SHORT TERM GOAL #3   Title Pt will be able to ambulate 400' on level surfaces with walker mod I for improved household mobility and short community distances.    Baseline supervision-min A for safety after longer distances    Time 4    Period Weeks    Status Not Met    Target Date 04/05/21      PT SHORT TERM GOAL #4   Title Pt will increase Berg from 28/56 to >34/56 for improved balance.    Baseline 03/07/21 28/56 > 31/56    Time 4    Period Weeks    Status Partially Met    Target Date 04/05/21               PT Long Term Goals - 03/08/21 1220       PT LONG TERM GOAL #1   Title Pt will be independent with progressive HEP for strengthening, balance and aerobic activity to continue gains on own.    Time 8    Period Weeks    Status New    Target Date 05/01/21      PT LONG TERM GOAL #2   Title Pt will increase gait speed to >0.57m/s for improved gait safety.    Baseline 03/07/21 0.53m/s    Time 8    Period Weeks    Status New    Target Date 05/04/21      PT LONG TERM GOAL #3   Title Pt will increase Berg from 28 to >40/56 for improved balance  and decreased fall risk.    Baseline 03/07/21 28/56    Time 8    Period Weeks    Status New    Target Date 05/04/21      PT LONG TERM GOAL #4   Title Pt will ambulate >500' on varied surfaces with LRAD mod I for improved community access.    Time 8    Period Weeks    Status New     Target Date 05/04/21                   Plan - 04/16/21 1637     Clinical Impression Statement Pt BP improved today now that pt is back on BP medication and patch.  Continued assessment of final STG; pt did not meet ambulation goal of MOD I over level, indoor surfaces and continues to require supervision and intermittent min A when ambulating longer distances due to shoulder pain, flexed trunk and decreased control of rollator due to COG shifted forwards over BOS.  Rest of therapy session focused on controlled weight shifting forwards and using squat position to maintain COG over BOS during controlled LE movements.  Will continue to address and progress towards LTG.    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    Examination-Activity Limitations Locomotion Level;Transfers;Stairs;Stand;Dressing;Bend    Examination-Participation Restrictions Meal Prep;Cleaning;Community Activity;Yard Work    [employer] officer Evolving/Moderate complexity    Rehab Potential Good    PT Frequency 2x / week   plus eval   PT Duration 8 weeks    PT Treatment/Interventions ADLs/Self Care Home Management;Aquatic Therapy;DME Instruction;Gait training;Stair training;Functional mobility training;Therapeutic activities;Therapeutic exercise;Balance training;Neuromuscular re-education;Manual techniques;Passive range of motion;Vestibular;Patient/family education    PT Next Visit Plan Monitor BP closely. Continue BWS over treadmill as long as BP ok (used large harness with about 30# unweighted). Continue gait training with pt's new rollator. Continue to work on engaging core with balance activities with slow, controlled movements-sustained squats, tall kneeling and quadruped as shoulder is able to tolerate.    Consulted and Agree with Plan of Care Patient              Patient will benefit from skilled therapeutic intervention in order to improve the following deficits and impairments:  Abnormal gait, Decreased balance, Decreased mobility, Decreased strength, Decreased knowledge of use of DME, Decreased activity tolerance, Decreased coordination  Visit Diagnosis: Muscle weakness (generalized)  Unsteadiness on feet  Other lack of coordination  Other abnormalities of gait and mobility  Ataxia     Problem List Patient Active Problem List   Diagnosis Date Noted   Anemia in chronic kidney disease 03/05/2021   Chronic kidney disease 03/05/2021   Diabetes ([place]) 03/05/2021   Non compliance w medication regimen 03/05/2021   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

## 2021-04-16 NOTE — Therapy (Signed)
[facility] [address] [place], [place], [address] Phone: [phone]   Fax:  [phone]  Occupational Therapy Treatment  Patient Details  Name: Carlos Rice MRN: [id number] Date of Birth: Jul 22, 1970 Referring Provider (OT): Erenest Rasher f/u   Encounter Date: 04/16/2021   OT End of Session - 04/16/21 1617     Visit Number 8    Number of Visits 13    Date for OT Re-Evaluation 05/02/21    Authorization Type [employer]    Authorization Time Period $20 copay - Auth Req'd - VL:MN, 12 visits by 05/02/21    Authorization - Visit Number 8    Authorization - Number of Visits 12    OT Start Time 1324    OT Stop Time 1700    OT Time Calculation (min) 43 min    Activity Tolerance Patient tolerated treatment well    Behavior During Therapy [facility] for tasks assessed/performed             Past Medical History:  Diagnosis Date   CKD (chronic kidney disease) stage 4, GFR 15-29 ml/min (HCC)    CVA (cerebral vascular accident) ([place])    High blood pressure    History of noncompliance with medical treatment    T2DM (type 2 diabetes mellitus) ([place])     Past Surgical History:  Procedure Laterality Date   AMPUTATION TOE Right    great toe   AMPUTATION TOE Left    5th toe   DG GREAT TOE RIGHT FOOT     great toe     ROTATOR CUFF REPAIR Right 2018    There were no vitals filed for this visit.   Subjective Assessment - 04/16/21 1617     Subjective  "whatcha got for me today, Boss"    Pertinent History T2DM, CKD, CVA, HTN    Limitations Fall Risk. Ataxia RUE.    Patient Stated Goals "get off this walker and I just wanna walk" and then reported "I want to write" and "I want to walk around [facility]."    Currently in Pain? No/denies    Pain Score 0-No pain             Reviewed Theraband exercises - upgraded punches to red theraband.   Small Peg Board with RUE with mod difficulty and mod drops with  copying pattern with 100% accuracy. Pt req'd increased time.                     OT Short Term Goals - 04/05/21 1622       OT SHORT TERM GOAL #1   Title Pt will be independent with HEP for coordination and grip strength    Time 4    Period Weeks    Status Achieved    Target Date 04/04/21      OT SHORT TERM GOAL #2   Title Pt will demonstrate dynamic standing balance with no LOB in order to increase independence and safety with ADLs.    Time 4    Period Weeks    Status On-going   pt continues to be unsteady while standing d/t ataxia     OT SHORT TERM GOAL #3   Title Pt will increase Box and Blocks score, Bilterally, by 5 blocks    Baseline R 32, L 38    Time 4    Period Weeks    Status On-going   L 35, R 32 04/05/21  OT SHORT TERM GOAL #4   Title Pt will demonstrate writing first and last name with 100% legibility with use of adapted strategies for ataxia    Time 4    Period Weeks    Status Achieved               OT Long Term Goals - 03/07/21 1545       OT LONG TERM GOAL #1   Title Pt will be independent with any updated HEPs    Time 8    Period Weeks    Status New    Target Date 05/02/21      OT LONG TERM GOAL #2   Title Pt will increase coordination in RUE by completing 9 hole peg test in 75 seconds or less.    Baseline R 84s, L 45.28s    Time 8    Period Weeks    Status New      OT LONG TERM GOAL #3   Title Pt will increase legibiity of handwriting with use of strategies by writing 1-2 sentences with 100% legibility.    Time 8    Period Weeks    Status New      OT LONG TERM GOAL #4   Title Pt will verbalize understanding of adapted strategies for increasing independence and safety with ADLs and IADLs.    Time 8    Period Weeks    Status New      OT LONG TERM GOAL #5   Title Pt will increase grip strength in RUE by 5 lbs or greater.    Baseline R 66.7, L 78    Time 8    Period Weeks    Status New                    Plan - 04/16/21 1701     Clinical Impression Statement Pt continues to progress towards goals. Pt with ataxia impeding overall coordination but has made many gains with [facility]    OT Occupational Profile and History Detailed Assessment- Review of Records and additional review of physical, cognitive, psychosocial history related to current functional performance    Occupational performance deficits (Please refer to evaluation for details): IADL's;ADL's;Leisure;Work    Marketing executive / Function / Physical Skills ADL;Decreased knowledge of use of DME;Strength;Coordination;FMC;Flexibility;Mobility;ROM;IADL;Vision;Proprioception;UE functional use;GMC;Dexterity    Rehab Potential Good    Clinical Decision Making Several treatment options, min-mod task modification necessary    Comorbidities Affecting Occupational Performance: May have comorbidities impacting occupational performance    Modification or Assistance to Complete Evaluation  Min-Moderate modification of tasks or assist with assess necessary to complete eval    OT Frequency 2x / week    OT Duration 8 weeks   12 visits over 8 weeks d/t any scheduling conflicts.   OT Treatment/Interventions Self-care/ADL training;Splinting;DME and/or AE instruction;Fluidtherapy;Aquatic Therapy;Therapeutic activities;Therapeutic exercise;Manual Therapy;Patient/family education;Visual/perceptual remediation/compensation;Functional Mobility Training;Passive range of motion;Neuromuscular education    Plan handwriting, standing dynamic balance, coordination RUE    Consulted and Agree with Plan of Care Patient             Patient will benefit from skilled therapeutic intervention in order to improve the following deficits and impairments:   Body Structure / Function / Physical Skills: ADL, Decreased knowledge of use of DME, Strength, Coordination, FMC, Flexibility, Mobility, ROM, IADL, Vision, Proprioception, UE functional use, GMC, Dexterity        Visit Diagnosis: Muscle weakness (generalized)  Unsteadiness on feet  Other lack  of coordination  Visuospatial deficit  Ataxia    Problem List Patient Active Problem List   Diagnosis Date Noted   Anemia in chronic kidney disease 03/05/2021   Chronic kidney disease 03/05/2021   Diabetes ([place]) 03/05/2021   Non compliance w medication regimen 03/05/2021   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

## 2021-04-19 ENCOUNTER — Ambulatory Visit: Payer: Medicare HMO

## 2021-04-19 ENCOUNTER — Ambulatory Visit: Payer: Medicare HMO | Attending: Physician Assistant | Admitting: Occupational Therapy

## 2021-04-19 ENCOUNTER — Other Ambulatory Visit: Payer: Self-pay

## 2021-04-19 ENCOUNTER — Encounter: Payer: Self-pay | Admitting: Occupational Therapy

## 2021-04-19 DIAGNOSIS — R278 Other lack of coordination: Secondary | ICD-10-CM | POA: Insufficient documentation

## 2021-04-19 DIAGNOSIS — R270 Ataxia, unspecified: Secondary | ICD-10-CM | POA: Insufficient documentation

## 2021-04-19 DIAGNOSIS — M6281 Muscle weakness (generalized): Secondary | ICD-10-CM | POA: Diagnosis not present

## 2021-04-19 DIAGNOSIS — R2689 Other abnormalities of gait and mobility: Secondary | ICD-10-CM | POA: Diagnosis present

## 2021-04-19 DIAGNOSIS — R41842 Visuospatial deficit: Secondary | ICD-10-CM | POA: Diagnosis present

## 2021-04-19 DIAGNOSIS — R2681 Unsteadiness on feet: Secondary | ICD-10-CM | POA: Diagnosis present

## 2021-04-19 NOTE — Therapy (Signed)
[facility] ?[place], [place], [address] ?Phone: [phone]   Fax:  [phone] ? ?Occupational Therapy Treatment ? ?Patient Details  ?Name: Carlos Rice ?MRN: [id number] ?Date of Birth: 1970-05-30 ?Referring Provider (OT): Lorena f/u ? ? ?Encounter Date: 04/19/2021 ? ? OT End of Session - 04/19/21 1621   ? ? Visit Number 9   ? Number of Visits 13   ? Date for OT Re-Evaluation 05/02/21   ? Authorization Type [employer]   ? Authorization Time Period $20 copay - Auth Req'd - VL:MN, 12 visits by 05/02/21   ? Authorization - Visit Number 9   ? Authorization - Number of Visits 12   ? OT Start Time 1619   ? OT Stop Time 1700   ? OT Time Calculation (min) 41 min   ? Activity Tolerance Patient tolerated treatment well   ? Behavior During Therapy [facility] for tasks assessed/performed   ? ?  ?  ? ?  ? ? ?Past Medical History:  ?Diagnosis Date  ? CKD (chronic kidney disease) stage 4, GFR 15-29 ml/min (HCC)   ? CVA (cerebral vascular accident) [facility])   ? High blood pressure   ? History of noncompliance with medical treatment   ? T2DM (type 2 diabetes mellitus) ([place])   ? ? ?Past Surgical History:  ?Procedure Laterality Date  ? AMPUTATION TOE Right   ? great toe  ? AMPUTATION TOE Left   ? 5th toe  ? DG GREAT TOE RIGHT FOOT    ? great toe    ? ROTATOR CUFF REPAIR Right 2018  ? ? ?There were no vitals filed for this visit. ? ? Subjective Assessment - 04/19/21 1620   ? ? Subjective  "terrible, time to go"   ? Pertinent History T2DM, CKD, CVA, HTN   ? Limitations Fall Risk. Ataxia RUE.   ? Patient Stated Goals "get off this walker and I just wanna walk" and then reported "I want to write" and "I want to walk around [facility]."   ? Currently in Pain? No/denies   ? ?  ?  ? ?  ? ? 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 Pt req'd mod support at core for maintaining balance for exercises.  Reaching with LUE and weight bearing into RUE for shoulder  stability and proprioception into RUE while placing 1 inch blocks with LUE. Pt with report of significant fatigue in RUE shoulder during task. Pt tolerated approximately 2 minutes at a time in quadruped. Child's Pose x 4. ? ?Stretching with physioball while seated edge of mat in forward reaching x 10 with prolonged hold at end range for approximately 15 seconds.  ? ?Handwriting with tracing shapes with RUE and highlighter with wide barrel and felt tip for increased coordination and accuracy. Pt then traced letters with highlighter. Minimal deviation but loss of control and shakiness at some points d/t ataxia.  ? ? ? ? ? ? ? ? ? ? ? ? ? ? ? ? ? ? ? ? OT Short Term Goals - 04/05/21 1622   ? ?  ? OT SHORT TERM GOAL #1  ? Title Pt will be independent with HEP for coordination and grip strength   ? Time 4   ? Period Weeks   ? Status Achieved   ? Target Date 04/04/21   ?  ? OT SHORT TERM GOAL #2  ? Title Pt will demonstrate dynamic standing balance with no  LOB in order to increase independence and safety with ADLs.   ? Time 4   ? Period Weeks   ? Status On-going   pt continues to be unsteady while standing d/t ataxia  ?  ? OT SHORT TERM GOAL #3  ? Title Pt will increase Box and Blocks score, Bilterally, by 5 blocks   ? Baseline R 32, L 38   ? Time 4   ? Period Weeks   ? Status On-going   L 35, R 32 04/05/21  ?  ? OT SHORT TERM GOAL #4  ? Title Pt will demonstrate writing first and last name with 100% legibility with use of adapted strategies for ataxia   ? Time 4   ? Period Weeks   ? Status Achieved   ? ?  ?  ? ?  ? ? ? ? OT Long Term Goals - 03/07/21 1545   ? ?  ? OT LONG TERM GOAL #1  ? Title Pt will be independent with any updated HEPs   ? Time 8   ? Period Weeks   ? Status New   ? Target Date 05/02/21   ?  ? OT LONG TERM GOAL #2  ? Title Pt will increase coordination in RUE by completing 9 hole peg test in 75 seconds or less.   ? Baseline R 84s, L 45.28s   ? Time 8   ? Period Weeks   ? Status New   ?  ? OT LONG TERM  GOAL #3  ? Title Pt will increase legibiity of handwriting with use of strategies by writing 1-2 sentences with 100% legibility.   ? Time 8   ? Period Weeks   ? Status New   ?  ? OT LONG TERM GOAL #4  ? Title Pt will verbalize understanding of adapted strategies for increasing independence and safety with ADLs and IADLs.   ? Time 8   ? Period Weeks   ? Status New   ?  ? OT LONG TERM GOAL #5  ? Title Pt will increase grip strength in RUE by 5 lbs or greater.   ? Baseline R 66.7, L 78   ? Time 8   ? Period Weeks   ? Status New   ? ?  ?  ? ?  ? ? ? ? ? ? ? ? Plan - 04/19/21 1622   ? ? Clinical Impression Statement Pt continues to progress towards goals. Pt with ataxia impeding overall coordination but has made many gains with [facility]   ? OT Occupational Profile and History Detailed Assessment- Review of Records and additional review of physical, cognitive, psychosocial history related to current functional performance   ? Occupational performance deficits (Please refer to evaluation for details): IADL's;ADL's;Leisure;Work   ? Body Structure / Function / Physical Skills ADL;Decreased knowledge of use of DME;Strength;Coordination;FMC;Flexibility;Mobility;ROM;IADL;Vision;Proprioception;UE functional use;GMC;Dexterity   ? Rehab Potential Good   ? Clinical Decision Making Several treatment options, min-mod task modification necessary   ? Comorbidities Affecting Occupational Performance: May have comorbidities impacting occupational performance   ? Modification or Assistance to Complete Evaluation  Min-Moderate modification of tasks or assist with assess necessary to complete eval   ? OT Frequency 2x / week   ? OT Duration 8 weeks   12 visits over 8 weeks d/t any scheduling conflicts.  ? OT Treatment/Interventions Self-care/ADL training;Splinting;DME and/or AE instruction;Fluidtherapy;Aquatic Therapy;Therapeutic activities;Therapeutic exercise;Manual Therapy;Patient/family education;Visual/perceptual  remediation/compensation;Functional Mobility Training;Passive range of motion;Neuromuscular education   ? Plan  handwriting, standing dynamic balance, coordination RUE   ? Consulted and Agree with Plan of Care Patient   ? ?  ?  ? ?  ? ? ?Patient will benefit from skilled therapeutic intervention in order to improve the following deficits and impairments:   ?Body Structure / Function / Physical Skills: ADL, Decreased knowledge of use of DME, Strength, Coordination, FMC, Flexibility, Mobility, ROM, IADL, Vision, Proprioception, UE functional use, GMC, Dexterity ?  ?  ? ? ?Visit Diagnosis: ?Muscle weakness (generalized) ? ?Unsteadiness on feet ? ?Other lack of coordination ? ?Visuospatial deficit ? ? ? ?Problem List ?Patient Active Problem List  ? Diagnosis Date Noted  ? Anemia in chronic kidney disease 03/05/2021  ? Chronic kidney disease 03/05/2021  ? Diabetes ([place]) 03/05/2021  ? Non compliance w medication regimen 03/05/2021  ? 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 Visual field cut 03/05/2021  ? ICH (intracerebral hemorrhage) ([place]) 02/17/2021  ? ? ?Zachery Conch, OT ?04/19/2021, 4:33 PM ? ?[facility] ?[place] ?[place], [place], [address] ?Phone: [phone]   Fax:  [phone] ? ?Name: Averey Trompeter ?MRN: [id number] ?Date of Birth: 1970/08/01 ? ?

## 2021-04-19 NOTE — Therapy (Signed)
?OUTPATIENT PHYSICAL THERAPY TREATMENT NOTE ? ? ?Patient Name: Carlos Rice ?MRN: [id number] ?DOB:[birth year], 51 y.o., male ?Today's Date: 04/19/2021 ? ?PCP: Pcp, No ?REFERRING PROVIDER: Cathlyn Parsons, PA-C ? ? PT End of Session - 04/19/21 1532   ? ? Visit Number 8   ? Number of Visits 17   ? Date for PT Re-Evaluation 05/04/21   ? Authorization Type [employer] Medicare; $20 copay per discipline; 10th visit PN, 17 visits 1/18-3/17/23   ? Authorization Time Period 17 visits approved from 1/18 - 3/17   ? Authorization - Visit Number 8   ? Authorization - Number of Visits 17   ? Progress Note Due on Visit 10   ? PT Start Time 1530   ? PT Stop Time 1612   ? PT Time Calculation (min) 42 min   ? Activity Tolerance Patient tolerated treatment well   ? Behavior During Therapy [facility] for tasks assessed/performed   ? ?  ?  ? ?  ? ? ?Past Medical History:  ?Diagnosis Date  ? CKD (chronic kidney disease) stage 4, GFR 15-29 ml/min (HCC)   ? CVA (cerebral vascular accident) [facility])   ? High blood pressure   ? History of noncompliance with medical treatment   ? T2DM (type 2 diabetes mellitus) ([place])   ? ?Past Surgical History:  ?Procedure Laterality Date  ? AMPUTATION TOE Right   ? great toe  ? AMPUTATION TOE Left   ? 5th toe  ? DG GREAT TOE RIGHT FOOT    ? great toe    ? ROTATOR CUFF REPAIR Right 2018  ? ?Patient Active Problem List  ? Diagnosis Date Noted  ? Anemia in chronic kidney disease 03/05/2021  ? Chronic kidney disease 03/05/2021  ? Diabetes ([place]) 03/05/2021  ? Non compliance w medication regimen 03/05/2021  ? 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 Visual field cut 03/05/2021  ? ICH (intracerebral hemorrhage) ([place]) 02/17/2021  ? ? ?REFERRING DIAG: ICH ? ?THERAPY DIAG:  ?Other abnormalities of gait and mobility ? ?Muscle weakness (generalized) ? ?Unsteadiness on feet ? ?PERTINENT HISTORY: CVAx7. Did have rehab in [place] with prior but was not happy with it.  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 ?PRECAUTIONS: fall ? ?SUBJECTIVE: No falls. Pt has been trying to walk some at home with a cane. ? ?PAIN:  ?Are you having pain? No ? ? ? ? ?TODAY'S TREATMENT:  ?BP=160/90 ?BWS over treadmill with 30# unweighted 5 min x 2 at 0.54mph. 1 min longest bout without hands. 1 PT at trunk with walking with 1 UE support and 2 other PT on each leg without UE support. Mostly just SBA/CGA on RLE but mod assist at times on LLE for placement and clearance. Verbal and tactile cues at trunk for erect posture. Standing rest break between bouts. BP=160/90 and HR=82 after ?Tall kneeling with large blue physioball in front: first working on upright posture x 30 sec, then moving ball forwards and back x 10 then to each side diagonal x 6, then no UE support x 30 sec, then alternating shoulder flexion x 10 with no UE support. Verbal cues to engage core throughout with close SBA/CGA. ? ? ?PATIENT EDUCATION: ?Education details: Pt was advised not to walk with just cane at home. ?Person educated: Patient ?Education method: Explanation ?Education comprehension: verbalized understanding ? ? ?HOME EXERCISE PROGRAM: ?[id number] ? ? PT Short Term Goals - 04/16/21 1636   ? ?  ?  PT SHORT TERM GOAL #1  ? Title Pt will be able to perform initial HEP for strengthening and balance on own.   ? Time 4   ? Period Weeks   ? Status Achieved   ? Target Date 04/05/21   ?  ? PT SHORT TERM GOAL #2  ? Title Pt will decrease 5 x sit to stand from 17.96 sec to<15 sec with improved control for improved balance and functional strength.   ? Baseline 03/07/21 17.96 sec without hands from chair > 21 seconds without UE from mat   ? Time 4   ? Period Weeks   ? Status Not Met   ? Target Date 04/05/21   ?  ? PT SHORT TERM GOAL #3  ? Title Pt will be able to ambulate 400' on level surfaces with walker mod I for improved household mobility and short community distances.   ? Baseline supervision-min A for safety after longer distances    ? Time 4   ? Period Weeks   ? Status Not Met   ? Target Date 04/05/21   ?  ? PT SHORT TERM GOAL #4  ? Title Pt will increase Berg from 28/56 to >34/56 for improved balance.   ? Baseline 03/07/21 28/56 > 31/56   ? Time 4   ? Period Weeks   ? Status Partially Met   ? Target Date 04/05/21   ? ?  ?  ? ?  ? ? ? PT Long Term Goals - 03/08/21 1220   ? ?  ? PT LONG TERM GOAL #1  ? Title Pt will be independent with progressive HEP for strengthening, balance and aerobic activity to continue gains on own.   ? Time 8   ? Period Weeks   ? Status New   ? Target Date 05/01/21   ?  ? PT LONG TERM GOAL #2  ? Title Pt will increase gait speed to >0.37m/s for improved gait safety.   ? Baseline 03/07/21 0.47m/s   ? Time 8   ? Period Weeks   ? Status New   ? Target Date 05/04/21   ?  ? PT LONG TERM GOAL #3  ? Title Pt will increase Berg from 28 to >40/56 for improved balance and decreased fall risk.   ? Baseline 03/07/21 28/56   ? Time 8   ? Period Weeks   ? Status New   ? Target Date 05/04/21   ?  ? PT LONG TERM GOAL #4  ? Title Pt will ambulate >500' on varied surfaces with LRAD mod I for improved community access.   ? Time 8   ? Period Weeks   ? Status New   ? Target Date 05/04/21   ? ?  ?  ? ?  ? ? ? Plan - 04/19/21 2017   ? ? Clinical Impression Statement Pt was able to progress gait in BWS over treadmill with longer bouts of no UE support with +3 therapist assist so that 2 could assist at feet as needed. LLE tended to scissor without UE support and had more trouble clearing without UE support. Continued to work on core strengthening and control.   ? Personal Factors and Comorbidities Comorbidity 3+;Behavior Pattern;Past/Current Experience   ? 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 Examination-Activity Limitations Locomotion Level;Transfers;Stairs;Stand;Dressing;Bend   ? Examination-Participation Restrictions Meal Prep;Cleaning;Community Activity;[place] Work   ?  Stability/Clinical Decision Making Evolving/Moderate complexity   ?  Rehab Potential Good   ? PT Frequency 2x / week   plus eval  ? PT Duration 8 weeks   ? PT Treatment/Interventions ADLs/Self Care Home Management;Aquatic Therapy;DME Instruction;Gait training;Stair training;Functional mobility training;Therapeutic activities;Therapeutic exercise;Balance training;Neuromuscular re-education;Manual techniques;Passive range of motion;Vestibular;Patient/family education   ? PT Next Visit Plan Monitor BP closely. Continue BWS over treadmill as long as BP ok (used large harness with about 30# unweighted).Will need at least 2nd person on left foot to work on letting go on treadmill. Continue gait training with pt's new rollator. Continue to work on engaging core with balance activities with slow, controlled movements-sustained squats, tall kneeling and quadruped as shoulder is able to tolerate. Try sitting balance on physioball?   ? Consulted and Agree with Plan of Care Patient   ? ?  ?  ? ?  ? ? ? ? ?Electa Sniff, PT, DPT, NCS ?04/19/2021, 8:20 PM ? ?  ? ?

## 2021-04-23 ENCOUNTER — Ambulatory Visit: Payer: Medicare HMO | Admitting: Occupational Therapy

## 2021-04-23 ENCOUNTER — Other Ambulatory Visit: Payer: Self-pay

## 2021-04-23 ENCOUNTER — Ambulatory Visit: Payer: Medicare HMO | Admitting: Physical Therapy

## 2021-04-23 ENCOUNTER — Encounter: Payer: Self-pay | Admitting: Occupational Therapy

## 2021-04-23 DIAGNOSIS — R2681 Unsteadiness on feet: Secondary | ICD-10-CM

## 2021-04-23 DIAGNOSIS — M6281 Muscle weakness (generalized): Secondary | ICD-10-CM | POA: Diagnosis not present

## 2021-04-23 DIAGNOSIS — R2689 Other abnormalities of gait and mobility: Secondary | ICD-10-CM

## 2021-04-23 DIAGNOSIS — R278 Other lack of coordination: Secondary | ICD-10-CM

## 2021-04-23 DIAGNOSIS — R41842 Visuospatial deficit: Secondary | ICD-10-CM

## 2021-04-23 NOTE — Therapy (Signed)
[place] ?[place] ?[place], [place], [address] ?Phone: [phone]   Fax:  [phone] ? ?Occupational Therapy Treatment/10th Visit Progress Note ? ?Patient Details  ?Name: Carlos Rice ?MRN: [id number] ?Date of Birth: 08/13/1970 ?Referring Provider (OT): Short Pump f/u ? ? ?Encounter Date: 04/23/2021 ? ? OT End of Session - 04/23/21 1621   ? ? Visit Number 10   ? Number of Visits 13   ? Date for OT Re-Evaluation 05/02/21   ? Authorization Type [employer]   ? Authorization Time Period $20 copay - Auth Req'd - VL:MN, 12 visits by 05/02/21   ? Authorization - Visit Number 10   ? Authorization - Number of Visits 12   ? OT Start Time [id number]   ? OT Stop Time 1700   ? OT Time Calculation (min) 42 min   ? Activity Tolerance Patient tolerated treatment well   ? Behavior During Therapy [facility] for tasks assessed/performed   ? ?  ?  ? ?  ? ? ?Past Medical History:  ?Diagnosis Date  ? CKD (chronic kidney disease) stage 4, GFR 15-29 ml/min (HCC)   ? CVA (cerebral vascular accident) [facility])   ? High blood pressure   ? History of noncompliance with medical treatment   ? T2DM (type 2 diabetes mellitus) ([place])   ? ? ?Past Surgical History:  ?Procedure Laterality Date  ? AMPUTATION TOE Right   ? great toe  ? AMPUTATION TOE Left   ? 5th toe  ? DG GREAT TOE RIGHT FOOT    ? great toe    ? ROTATOR CUFF REPAIR Right 2018  ? ? ?There were no vitals filed for this visit. ? ? Subjective Assessment - 04/23/21 1621   ? ? Subjective  "my dog is in the car"   ? Pertinent History T2DM, CKD, CVA, HTN   ? Limitations Fall Risk. Ataxia RUE.   ? Patient Stated Goals "get off this walker and I just wanna walk" and then reported "I want to write" and "I want to walk around [facility]."   ? Currently in Pain? No/denies   ? Pain Score 0-No pain   ? ?  ?  ? ?  ? ? ? ? ? [place] OT Assessment - 04/23/21 0001   ? ?  ? Coordination  ? Box and Blocks R 33   ? ?  ?  ? ?  ? ? ?checked goals and  worked on handwriting and reinforcing strategies for decreasing tremors and ataxia. ? ? ? ? ? ? ? ? ? ? ? ? ? ? ? ? ? ? OT Short Term Goals - 04/23/21 1627   ? ?  ? OT SHORT TERM GOAL #1  ? Title Pt will be independent with HEP for coordination and grip strength   ? Time 4   ? Period Weeks   ? Status Achieved   ? Target Date 04/04/21   ?  ? OT SHORT TERM GOAL #2  ? Title Pt will demonstrate dynamic standing balance with no LOB in order to increase independence and safety with ADLs.   ? Time 4   ? Period Weeks   ? Status On-going   pt continues to be unsteady while standing d/t ataxia  ?  ? OT SHORT TERM GOAL #3  ? Title Pt will increase Box and Blocks score, Bilterally, by 5 blocks   ? Baseline R 32, L 38   ? Time 4   ?  Period Weeks   ? Status On-going   L 34, R 33 04/23/21  ?  ? OT SHORT TERM GOAL #4  ? Title Pt will demonstrate writing first and last name with 100% legibility with use of adapted strategies for ataxia   ? Time 4   ? Period Weeks   ? Status Achieved   ? ?  ?  ? ?  ? ? ? ? OT Long Term Goals - 04/23/21 1630   ? ?  ? OT LONG TERM GOAL #1  ? Title Pt will be independent with any updated HEPs   ? Time 8   ? Period Weeks   ? Status On-going   ? Target Date 05/02/21   ?  ? OT LONG TERM GOAL #2  ? Title Pt will increase coordination in RUE by completing 9 hole peg test in 75 seconds or less.   ? Baseline R 84s, L 45.28s   ? Time 8   ? Period Weeks   ? Status On-going   04/23/21 RUE 109.38s, RUE 68s on second attempt  ?  ? OT LONG TERM GOAL #3  ? Title Pt will increase legibiity of handwriting with use of strategies by writing 1-2 sentences with 100% legibility.   ? Time 8   ? Period Weeks   ? Status On-going   improved but not 100% legible, yet. 04/23/21  ?  ? OT LONG TERM GOAL #4  ? Title Pt will verbalize understanding of adapted strategies for increasing independence and safety with ADLs and IADLs.   ? Time 8   ? Period Weeks   ? Status On-going   ?  ? OT LONG TERM GOAL #5  ? Title Pt will increase grip  strength in RUE by 5 lbs or greater.   ? Baseline R 66.7, L 78   ? Time 8   ? Period Weeks   ? Status On-going   RUE 65.6lbs 04/23/21  ? ?  ?  ? ?  ? ? ? ? ? ? ? ? Plan - 04/23/21 1652   ? ? Clinical Impression Statement Progress note completed for period from 03/07/21 - 04/23/21. Pt has met 2/4 STGs and progressing towards unmet goals. Pt continues to benefit from skilled occupational therapy to target unmet goals and continue working towards BUE coordination and strengthening.   ? OT Occupational Profile and History Detailed Assessment- Review of Records and additional review of physical, cognitive, psychosocial history related to current functional performance   ? Occupational performance deficits (Please refer to evaluation for details): IADL's;ADL's;Leisure;Work   ? Body Structure / Function / Physical Skills ADL;Decreased knowledge of use of DME;Strength;Coordination;FMC;Flexibility;Mobility;ROM;IADL;Vision;Proprioception;UE functional use;GMC;Dexterity   ? Rehab Potential Good   ? Clinical Decision Making Several treatment options, min-mod task modification necessary   ? Comorbidities Affecting Occupational Performance: May have comorbidities impacting occupational performance   ? Modification or Assistance to Complete Evaluation  Min-Moderate modification of tasks or assist with assess necessary to complete eval   ? OT Frequency 2x / week   ? OT Duration 8 weeks   12 visits over 8 weeks d/t any scheduling conflicts.  ? OT Treatment/Interventions Self-care/ADL training;Splinting;DME and/or AE instruction;Fluidtherapy;Aquatic Therapy;Therapeutic activities;Therapeutic exercise;Manual Therapy;Patient/family education;Visual/perceptual remediation/compensation;Functional Mobility Training;Passive range of motion;Neuromuscular education   ? Plan handwriting, standing dynamic balance, coordination RUE   ? Consulted and Agree with Plan of Care Patient   ? ?  ?  ? ?  ? ? ?Patient will benefit from skilled therapeutic  intervention in order to improve the following deficits and impairments:   ?Body Structure / Function / Physical Skills: ADL, Decreased knowledge of use of DME, Strength, Coordination, FMC, Flexibility, Mobility, ROM, IADL, Vision, Proprioception, UE functional use, GMC, Dexterity ?  ?  ? ? ?Visit Diagnosis: ?Other abnormalities of gait and mobility ? ?Unsteadiness on feet ? ?Other lack of coordination ? ?Visuospatial deficit ? ?Muscle weakness (generalized) ? ? ? ?Problem List ?Patient Active Problem List  ? Diagnosis Date Noted  ? Anemia in chronic kidney disease 03/05/2021  ? Chronic kidney disease 03/05/2021  ? Diabetes ([place]) 03/05/2021  ? Non compliance w medication regimen 03/05/2021  ? 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 Visual field cut 03/05/2021  ? ICH (intracerebral hemorrhage) ([place]) 02/17/2021  ? ? ?Zachery Conch, OT ?04/23/2021, 5:02 PM ? ?[place] ?[address] [place] ?[place], [place], [address] ?Phone: [phone]   Fax:  [phone] ? ?Name: Virgil Lightner ?MRN: [id number] ?Date of Birth: [birth year] ? ?

## 2021-04-23 NOTE — Therapy (Signed)
OUTPATIENT PHYSICAL THERAPY TREATMENT NOTE   Patient Name: Mubarak Bevens MRN: [id number] DOB:[birth year], 51 y.o., male Today's Date: 04/23/2021  PCP: Merryl Hacker, No REFERRING PROVIDER: Cathlyn Parsons, PA-C   PT End of Session - 04/23/21 1539     Visit Number 9    Number of Visits 17    Date for PT Re-Evaluation 05/04/21    Authorization Type [employer] Medicare; $20 copay per discipline; 10th visit PN, 17 visits 1/18-3/17/23    Authorization Time Period 17 visits approved from 1/18 - 3/17    Authorization - Visit Number 9    Authorization - Number of Visits 17    Progress Note Due on Visit 10    PT Start Time 5852    PT Stop Time 1616    PT Time Calculation (min) 41 min    Activity Tolerance Patient tolerated treatment well    Behavior During Therapy WFL for tasks assessed/performed              Past Medical History:  Diagnosis Date   CKD (chronic kidney disease) stage 4, GFR 15-29 ml/min (HCC)    CVA (cerebral vascular accident) ([place])    High blood pressure    History of noncompliance with medical treatment    T2DM (type 2 diabetes mellitus) ([place])    Past Surgical History:  Procedure Laterality Date   AMPUTATION TOE Right    great toe   AMPUTATION TOE Left    5th toe   DG GREAT TOE RIGHT FOOT     great toe     ROTATOR CUFF REPAIR Right 2018   Patient Active Problem List   Diagnosis Date Noted   Anemia in chronic kidney disease 03/05/2021   Chronic kidney disease 03/05/2021   Diabetes ([place]) 03/05/2021   Non compliance w medication regimen 03/05/2021   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) 02/17/2021    REFERRING DIAG: ICH  THERAPY DIAG:  Other abnormalities of gait and mobility  Muscle weakness (generalized)  Unsteadiness on feet  Other lack of coordination  PERTINENT HISTORY: CVAx7. Did have rehab in [place] with prior but was not happy with it.  2016  ischemic thalamus  HTN DM R Toe amputation, nonadherence to medications   PRECAUTIONS: fall  SUBJECTIVE: Went looking at new houses; took the cane because he had to go up/down stairs; brother was with him.  Is looking at a house with level entry but driveway is inclined.  Is interested in doing aquatic therapy.    PAIN:  Are you having pain? No   TODAY'S TREATMENT:  04/23/2021: BP: 160/90   Discussed aquatic therapy. Pt is interested in doing aquatic therapy on Tuesday mornings.  Discussed need to keep BP under control and to check BP prior to coming to aquatic; also discussed location of pool.  Pt to check with work to see if he would be able to have time off on Tuesday morning; looked at patient's schedule and PT does switch to Tuesday pm; may be able to convert PT from pm to AM pool.  Pt to let PT know work's response to this request.  NMR: Transitioned to sitting in chair in front of mat with black theraband around thighs; performed isometric hip ABD while performing 8 reps sit > squat with UE support on mat in front to shift weight forwards.  Pt progressing from requiring min-mod A from PT to fully shift weight  forwards over BOS to supervision.  Performed sustained squat with UE support with PT providing cues to shift COG slightly posterior over BOS and then performing lateral weight shifting in static squat.  Progressed and added to weight shifting by performing lateral stepping to L and R along mat with UE support on mat and therapist providing min A to maintain COG over BOS in squat position (pt tends to shift it too far forwards over toes) - performed x 3 laps down and back with black theraband around thighs.  Progressed side stepping to L and R in squat position by lifting UE off of mat with therapist providing increased support and facilitation of weight shifting at trunk/pelvis; also provided cues for sequencing of weight shifting, step initiation and foot clearance.  Pt fatigued more  quickly without UE support.    Pt asking if he can take black theraband home and perform LE exercises with it while at work; discussed with pt that at next PT visit will update HEP and provide pt with appropriate exercises and resistance band to use at home or work.  At end of session pt noted to ambulate with increased foot clearance when ambulating with rollator.    04/19/2021:   BP=160/90 BWS over treadmill with 30# unweighted 5 min x 2 at 0.41mph. 1 min longest bout without hands. 1 PT at trunk with walking with 1 UE support and 2 other PT on each leg without UE support. Mostly just SBA/CGA on RLE but mod assist at times on LLE for placement and clearance. Verbal and tactile cues at trunk for erect posture. Standing rest break between bouts. BP=160/90 and HR=82 after Tall kneeling with large blue physioball in front: first working on upright posture x 30 sec, then moving ball forwards and back x 10 then to each side diagonal x 6, then no UE support x 30 sec, then alternating shoulder flexion x 10 with no UE support. Verbal cues to engage core throughout with close SBA/CGA.   PATIENT EDUCATION: Education details: Information about aquatic therapy - days, location, possible openings next week, adjustment of schedule if work allows Person educated: Patient Education method: Explanation Education comprehension: verbalized understanding   HOME EXERCISE PROGRAM: [id number]   PT Short Term Goals - 04/16/21 1636       PT SHORT TERM GOAL #1   Title Pt will be able to perform initial HEP for strengthening and balance on own.    Time 4    Period Weeks    Status Achieved    Target Date 04/05/21      PT SHORT TERM GOAL #2   Title Pt will decrease 5 x sit to stand from 17.96 sec to<15 sec with improved control for improved balance and functional strength.    Baseline 03/07/21 17.96 sec without hands from chair > 21 seconds without UE from mat    Time 4    Period Weeks    Status Not Met     Target Date 04/05/21      PT SHORT TERM GOAL #3   Title Pt will be able to ambulate 400' on level surfaces with walker mod I for improved household mobility and short community distances.    Baseline supervision-min A for safety after longer distances    Time 4    Period Weeks    Status Not Met    Target Date 04/05/21      PT SHORT TERM GOAL #4   Title Pt will increase Berg from  28/56 to >34/56 for improved balance.    Baseline 03/07/21 28/56 > 31/56    Time 4    Period Weeks    Status Partially Met    Target Date 04/05/21              PT Long Term Goals - 03/08/21 1220       PT LONG TERM GOAL #1   Title Pt will be independent with progressive HEP for strengthening, balance and aerobic activity to continue gains on own.    Time 8    Period Weeks    Status New    Target Date 05/01/21      PT LONG TERM GOAL #2   Title Pt will increase gait speed to >0.14m/s for improved gait safety.    Baseline 03/07/21 0.51m/s    Time 8    Period Weeks    Status New    Target Date 05/04/21      PT LONG TERM GOAL #3   Title Pt will increase Berg from 28 to >40/56 for improved balance and decreased fall risk.    Baseline 03/07/21 28/56    Time 8    Period Weeks    Status New    Target Date 05/04/21      PT LONG TERM GOAL #4   Title Pt will ambulate >500' on varied surfaces with LRAD mod I for improved community access.    Time 8    Period Weeks    Status New    Target Date 05/04/21             Plan - 04/19/21 2017       Clinical Impression Statement Began discussion regarding incorporation of aquatic therapy into treatment plan.  Utilized sustained squat position during transfers and NMR to continue to promote increased proprioception, grading of movement and motor control during weight shifting and LE advancement.  Will continue to address and progress towards LTG.    Personal Factors and Comorbidities Comorbidity 3+;Behavior Pattern;Past/Current Experience      Comorbidities CVAx7 2016 ischemic thalamus  HTN DM R Toe amputation, nonadherence to medications     Examination-Activity Limitations Locomotion Level;Transfers;Stairs;Stand;Dressing;Bend     Examination-Participation Restrictions Meal Prep;Cleaning;Community Activity;Yard Work     [employer] Evolving/Moderate complexity     Rehab Potential Good     PT Frequency 2x / week   plus eval    PT Duration 8 weeks     PT Treatment/Interventions ADLs/Self Care Home Management;Aquatic Therapy;DME Instruction;Gait training;Stair training;Functional mobility training;Therapeutic activities;Therapeutic exercise;Balance training;Neuromuscular re-education;Manual techniques;Passive range of motion;Vestibular;Patient/family education     PT Next Visit Plan 10th visit PN.  Monitor BP closely. Did he ask work about doing aquatic on Tuesday morning?    Update PT HEP and provide pt with exercises he can do at work (seated) with theraband if this is appropriate.  Continue BWS over treadmill as long as BP ok (used large harness with about 30# unweighted).Will need at least 2nd person on left foot to work on letting go on treadmill. Continue gait training with pt's new rollator. Continue to work on engaging core with balance activities with slow, controlled movements-sustained squats, tall kneeling and quadruped as shoulder is able to tolerate. Try sitting balance on physioball?     Consulted and Agree with Plan of Care Patient    Rico Junker, PT, DPT 04/23/21    4:45 PM

## 2021-04-26 ENCOUNTER — Encounter: Payer: Medicare HMO | Attending: Physical Medicine and Rehabilitation | Admitting: Physical Medicine and Rehabilitation

## 2021-04-26 ENCOUNTER — Ambulatory Visit: Payer: Medicare HMO

## 2021-04-26 ENCOUNTER — Ambulatory Visit: Payer: Medicare HMO | Admitting: Occupational Therapy

## 2021-04-26 ENCOUNTER — Other Ambulatory Visit: Payer: Self-pay

## 2021-04-26 ENCOUNTER — Encounter: Payer: Self-pay | Admitting: Occupational Therapy

## 2021-04-26 DIAGNOSIS — Z7409 Other reduced mobility: Secondary | ICD-10-CM | POA: Insufficient documentation

## 2021-04-26 DIAGNOSIS — M6281 Muscle weakness (generalized): Secondary | ICD-10-CM

## 2021-04-26 DIAGNOSIS — I10 Essential (primary) hypertension: Secondary | ICD-10-CM | POA: Diagnosis not present

## 2021-04-26 DIAGNOSIS — Z789 Other specified health status: Secondary | ICD-10-CM | POA: Diagnosis not present

## 2021-04-26 DIAGNOSIS — R2689 Other abnormalities of gait and mobility: Secondary | ICD-10-CM

## 2021-04-26 DIAGNOSIS — R41842 Visuospatial deficit: Secondary | ICD-10-CM

## 2021-04-26 DIAGNOSIS — R2681 Unsteadiness on feet: Secondary | ICD-10-CM

## 2021-04-26 DIAGNOSIS — R278 Other lack of coordination: Secondary | ICD-10-CM

## 2021-04-26 DIAGNOSIS — R270 Ataxia, unspecified: Secondary | ICD-10-CM

## 2021-04-26 DIAGNOSIS — I610 Nontraumatic intracerebral hemorrhage in hemisphere, subcortical: Secondary | ICD-10-CM | POA: Insufficient documentation

## 2021-04-26 MED ORDER — CLONIDINE 0.2 MG/24HR TD PTWK: 0.2000 mg | MEDICATED_PATCH | TRANSDERMAL | 12 refills | Status: AC

## 2021-04-26 NOTE — Therapy (Signed)
OUTPATIENT PHYSICAL THERAPY TREATMENT NOTE/ arrived no charge   Patient Name: Carlos Rice MRN: [id number] DOB:07/12/1970, 51 y.o., male Today's Date: 04/26/2021  PCP: Merryl Hacker, No REFERRING PROVIDER: Cathlyn Parsons, PA-C   PT End of Session - 04/26/21 1618     Visit Number 9    Number of Visits 17    Date for PT Re-Evaluation 05/04/21    Authorization Type [employer]; $20 copay per discipline; 10th visit PN, 17 visits 1/18-3/17/23    Authorization Time Period 17 visits approved from 1/18 - 3/17    Authorization - Visit Number 9    Authorization - Number of Visits 17    Progress Note Due on Visit 10    PT Start Time 1616    PT Stop Time 5974   no charge   PT Time Calculation (min) 15 min    Activity Tolerance Patient tolerated treatment well    Behavior During Therapy WFL for tasks assessed/performed              Past Medical History:  Diagnosis Date   CKD (chronic kidney disease) stage 4, GFR 15-29 ml/min (HCC)    CVA (cerebral vascular accident) ([place])    High blood pressure    History of noncompliance with medical treatment    T2DM (type 2 diabetes mellitus) ([place])    Past Surgical History:  Procedure Laterality Date   AMPUTATION TOE Right    great toe   AMPUTATION TOE Left    5th toe   DG GREAT TOE RIGHT FOOT     great toe     ROTATOR CUFF REPAIR Right 2018   Patient Active Problem List   Diagnosis Date Noted   Anemia in chronic kidney disease 03/05/2021   Chronic kidney disease 03/05/2021   Diabetes ([place]) 03/05/2021   Non compliance w medication regimen 03/05/2021   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 Did have rehab in [place] with prior but was not happy with it.  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 Ranell Patrick today. They increased his clonidine patch and took off amlodipine per pt. Pt reports that he has not felt great today and did not work today. Has not picked up the new medicine and only took 1 amlodipine since Dr. Ranell Patrick told him to stop. Also reports clonidine patch due to be removed tomorrow. 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  PAIN:  Are you having pain? No   TODAY'S TREATMENT:    04/26/21:  BP=170/100  Checked again after 5 min and was 162/100. Due to pt not having new BP and elevated BP treatment witheld. Pt going to pick up meds after session.   04/23/2021: BP: 160/90   Discussed aquatic therapy. Pt is interested in doing aquatic therapy on Tuesday mornings.  Discussed need to keep BP under control and to check BP prior to coming to aquatic; also discussed location of pool.  Pt to check with work to see if he would be able to have time off on Tuesday morning; looked at patient's schedule and PT does switch to Tuesday pm; may be able to convert PT from pm to AM pool.  Pt to let PT know work's response to this request.  NMR: Transitioned to sitting in chair in front of mat with black theraband around thighs; performed isometric hip ABD while performing 8 reps sit > squat with UE support on mat in front to shift weight forwards.  Pt progressing from requiring min-mod A from PT to fully shift weight forwards over BOS to supervision.  Performed sustained squat with UE support with PT providing cues to shift COG slightly posterior over BOS and then performing lateral weight shifting in static squat.  Progressed and added to weight shifting by performing lateral stepping to L and R along mat with UE support on mat and therapist providing min A to maintain COG over BOS in squat position (pt tends to shift it too far forwards over toes) - performed x 3 laps down and back with black theraband around  thighs.  Progressed side stepping to L and R in squat position by lifting UE off of mat with therapist providing increased support and facilitation of weight shifting at trunk/pelvis; also provided cues for sequencing of weight shifting, step initiation and foot clearance.  Pt fatigued more quickly without UE support.    Pt asking if he can take black theraband home and perform LE exercises with it while at work; discussed with pt that at next PT visit will update HEP and provide pt with appropriate exercises and resistance band to use at home or work.  At end of session pt noted to ambulate with increased foot clearance when ambulating with rollator.    04/19/2021:   BP=160/90 BWS over treadmill with 30# unweighted 5 min x 2 at 0.52mh. 1 min longest bout without hands. 1 PT at trunk with walking with 1 UE support and 2 other PT on each leg without UE support. Mostly just SBA/CGA on RLE but mod assist at times on LLE for placement and clearance. Verbal and tactile cues at trunk for erect posture. Standing rest break between bouts. BP=160/90 and HR=82 after Tall kneeling with large blue physioball in front: first working on upright posture x 30 sec, then moving ball forwards and back x 10 then to each side diagonal x 6, then no UE support x 30 sec, then alternating shoulder flexion x 10 with no UE support. Verbal cues to engage core throughout with close SBA/CGA.   PATIENT EDUCATION: Education details:  Person educated: Patient Education method: Explanation Education comprehension: verbalized understanding   HOME EXERCISE PROGRAM: [id number]  PT Short Term Goals - 04/16/21 1636       PT SHORT TERM GOAL #1   Title Pt will be able to perform initial HEP for strengthening and balance on own.    Time 4    Period Weeks    Status Achieved    Target Date 04/05/21      PT SHORT TERM GOAL #2   Title Pt will decrease 5 x sit to stand from 17.96 sec to<15 sec with improved control for improved  balance and functional strength.    Baseline 03/07/21 17.96 sec without hands from chair > 21 seconds without UE from mat    Time 4    Period Weeks    Status Not Met    Target Date 04/05/21      PT SHORT TERM GOAL #3   Title Pt will be able to ambulate 400' on level surfaces with walker mod I for improved household mobility and short community distances.    Baseline supervision-min A  for safety after longer distances    Time 4    Period Weeks    Status Not Met    Target Date 04/05/21      PT SHORT TERM GOAL #4   Title Pt will increase Berg from 28/56 to >34/56 for improved balance.    Baseline 03/07/21 28/56 > 31/56    Time 4    Period Weeks    Status Partially Met    Target Date 04/05/21              PT Long Term Goals - 03/08/21 1220       PT LONG TERM GOAL #1   Title Pt will be independent with progressive HEP for strengthening, balance and aerobic activity to continue gains on own.    Time 8    Period Weeks    Status New    Target Date 05/01/21      PT LONG TERM GOAL #2   Title Pt will increase gait speed to >0.63ms for improved gait safety.    Baseline 03/07/21 0.352m    Time 8    Period Weeks    Status New    Target Date 05/04/21      PT LONG TERM GOAL #3   Title Pt will increase Berg from 28 to >40/56 for improved balance and decreased fall risk.    Baseline 03/07/21 28/56    Time 8    Period Weeks    Status New    Target Date 05/04/21      PT LONG TERM GOAL #4   Title Pt will ambulate >500' on varied surfaces with LRAD mod I for improved community access.    Time 8    Period Weeks    Status New    Target Date 05/04/21             Plan - 04/19/21 2017       Clinical Impression Statement Began discussion regarding incorporation of aquatic therapy into treatment plan.  Utilized sustained squat position during transfers and NMR to continue to promote increased proprioception, grading of movement and motor control during weight shifting and LE  advancement.  Will continue to address and progress towards LTG.    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  HTN DM R Toe amputation, nonadherence to medications     Examination-Activity Limitations Locomotion Level;Transfers;Stairs;Stand;Dressing;Bend     Examination-Participation Restrictions Meal Prep;Cleaning;Community Activity;Yard Work     [employer] officervolving/Moderate complexity     Rehab Potential Good     PT Frequency 2x / week   plus eval    PT Duration 8 weeks     PT Treatment/Interventions ADLs/Self Care Home Management;Aquatic Therapy;DME Instruction;Gait training;Stair training;Functional mobility training;Therapeutic activities;Therapeutic exercise;Balance training;Neuromuscular re-education;Manual techniques;Passive range of motion;Vestibular;Patient/family education     PT Next Visit Plan 10th visit PN.  Monitor BP closely. Did he ask work about doing aquatic on Tuesday morning?    Update PT HEP and provide pt with exercises he can do at work (seated) with theraband if this is appropriate.  Continue BWS over treadmill as long as BP ok (used large harness with about 30# unweighted).Will need at least 2nd person on left foot to work on letting go on treadmill. Continue gait training with pt's new rollator. Continue to work on engaging core with balance activities with slow, controlled movements-sustained squats, tall kneeling and quadruped as shoulder is able to tolerate.  Try sitting balance on physioball?     Consulted and Agree with Plan of Care Patient   Cherly Anderson, PT, DPT, NCS 04/26/21    4:52 PM

## 2021-04-26 NOTE — Therapy (Signed)
[facility] ?[place], [place], [address] ?Phone: [phone]   Fax:  [phone] ? ?Occupational Therapy Treatment ? ?Patient Details  ?Name: Carlos Rice ?MRN: [id number] ?Date of Birth: [birth year] ?Referring Provider (OT): [place] f/u ? ? ?Encounter Date: 04/26/2021 ? ? OT End of Session - 04/26/21 1538   ? ? Visit Number 11   ? Number of Visits 13   ? Date for OT Re-Evaluation 05/02/21   ? Authorization Type [employer]   ? Authorization Time Period $20 copay - Auth Req'd - VL:[place], 12 visits by 05/02/21   ? Authorization - Visit Number 10   ? Authorization - Number of Visits 12   ? OT Start Time 1533   ? OT Stop Time 1615   ? OT Time Calculation (min) 42 min   ? Activity Tolerance Patient tolerated treatment well   ? Behavior During Therapy [facility] for tasks assessed/performed   ? ?  ?  ? ?  ? ? ?Past Medical History:  ?Diagnosis Date  ? CKD (chronic kidney disease) stage 4, GFR 15-29 ml/min (HCC)   ? CVA (cerebral vascular accident) [facility])   ? High blood pressure   ? History of noncompliance with medical treatment   ? T2DM (type 2 diabetes mellitus) ([place])   ? ? ?Past Surgical History:  ?Procedure Laterality Date  ? AMPUTATION TOE Right   ? great toe  ? AMPUTATION TOE Left   ? 5th toe  ? DG GREAT TOE RIGHT FOOT    ? great toe    ? ROTATOR CUFF REPAIR Right 2018  ? ? ?There were no vitals filed for this visit. ? ? Subjective Assessment - 04/26/21 1537   ? ? Subjective  "I've been better"   ? Pertinent History T2DM, CKD, CVA, HTN   ? Limitations Fall Risk. Ataxia RUE.   ? Patient Stated Goals "get off this walker and I just wanna walk" and then reported "I want to write" and "I want to walk around [facility]."   ? Currently in Pain? No/denies   ? Pain Score 0-No pain   ? ?  ?  ? ?  ? ? ?Small Peg Board with RUE with in hand manipulation with RUE with mod drops and mod difficulty with placing pegs. Pt copied pattern with min difficulty  however did not finish d/t time constraint and req'ing increased time.  ? ?Core Strengthening - dead bug x 10, heel taps x 10, Modified V sit ups x 10 ? ? ? ? ? ? ? ? ? ? ? ? ? ? ? ? ? ? ? ? ? ? OT Short Term Goals - 04/23/21 1627   ? ?  ? OT SHORT TERM GOAL #1  ? Title Pt will be independent with HEP for coordination and grip strength   ? Time 4   ? Period Weeks   ? Status Achieved   ? Target Date 04/04/21   ?  ? OT SHORT TERM GOAL #2  ? Title Pt will demonstrate dynamic standing balance with no LOB in order to increase independence and safety with ADLs.   ? Time 4   ? Period Weeks   ? Status On-going   pt continues to be unsteady while standing d/t ataxia  ?  ? OT SHORT TERM GOAL #3  ? Title Pt will increase Box and Blocks score, Bilterally, by 5 blocks   ? Baseline R 32, L 38   ?  Time 4   ? Period Weeks   ? Status On-going   L 34, R 33 04/23/21  ?  ? OT SHORT TERM GOAL #4  ? Title Pt will demonstrate writing first and last name with 100% legibility with use of adapted strategies for ataxia   ? Time 4   ? Period Weeks   ? Status Achieved   ? ?  ?  ? ?  ? ? ? ? OT Long Term Goals - 04/23/21 1630   ? ?  ? OT LONG TERM GOAL #1  ? Title Pt will be independent with any updated HEPs   ? Time 8   ? Period Weeks   ? Status On-going   ? Target Date 05/02/21   ?  ? OT LONG TERM GOAL #2  ? Title Pt will increase coordination in RUE by completing 9 hole peg test in 75 seconds or less.   ? Baseline R 84s, L 45.28s   ? Time 8   ? Period Weeks   ? Status On-going   04/23/21 RUE 109.38s, RUE 68s on second attempt  ?  ? OT LONG TERM GOAL #3  ? Title Pt will increase legibiity of handwriting with use of strategies by writing 1-2 sentences with 100% legibility.   ? Time 8   ? Period Weeks   ? Status On-going   improved but not 100% legible, yet. 04/23/21  ?  ? OT LONG TERM GOAL #4  ? Title Pt will verbalize understanding of adapted strategies for increasing independence and safety with ADLs and IADLs.   ? Time 8   ? Period Weeks   ?  Status On-going   ?  ? OT LONG TERM GOAL #5  ? Title Pt will increase grip strength in RUE by 5 lbs or greater.   ? Baseline R 66.7, L 78   ? Time 8   ? Period Weeks   ? Status On-going   RUE 65.6lbs 04/23/21  ? ?  ?  ? ?  ? ? ? ? ? ? ? ? ? ?Patient will benefit from skilled therapeutic intervention in order to improve the following deficits and impairments:   ?  ?  ?  ? ? ?Visit Diagnosis: ?Other abnormalities of gait and mobility ? ?Unsteadiness on feet ? ?Visuospatial deficit ? ?Other lack of coordination ? ?Muscle weakness (generalized) ? ?Ataxia ? ? ? ?Problem List ?Patient Active Problem List  ? Diagnosis Date Noted  ? Anemia in chronic kidney disease 03/05/2021  ? Chronic kidney disease 03/05/2021  ? Diabetes ([place]) 03/05/2021  ? Non compliance w medication regimen 03/05/2021  ? 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 Visual field cut 03/05/2021  ? ICH (intracerebral hemorrhage) ([place]) 02/17/2021  ? ? ?Zachery Conch, OT ?04/26/2021, 4:30 PM ? ?[facility] ?[place] ?[place], [place], [address] ?Phone: [phone]   Fax:  [phone] ? ?Name: Carlos Rice ?MRN: [id number] ?Date of Birth: 01-29-1971 ? ?

## 2021-04-27 NOTE — Progress Notes (Signed)
? ?  Subjective:  ? ? Patient ID: Carlos Rice, male    DOB: December 06, 1970, 51 y.o.   MRN: [id number] ? ?HPI ?An audio/video tele-health visit is felt to be the most appropriate encounter for this patient at this time. This is a follow up tele-visit via phone. The patient is at home. MD is at office. Prior to scheduling this appointment, our staff discussed the limitations of evaluation and management by telemedicine and the availability of in-person appointments. The patient expressed understanding and agreed to proceed.  ? ?Carlos Rice is a 51 year old man presenting for f/u of ICH, impaired mobility, and HTN ? ?1) ICH ?-started outpatient therapy and had a good eval day ?-he hopes they work him as hard as he worked in inpatient rehab ?-he was able to bike 2 miles and his son felt this was too much.  ?-he feels he is walked too slowly with his walker and would like a quad cane ?-missed therapy yesterday since his mother did not want to drive in the heavy rain ?-he has been doing core and leg exercises ? ?2) HTN ?-BP is at best level it has been since leaving the hospital. 466Z systolic this morning, at worst it is 160s systolc.  ?-he has been eating kiwis, has not been eating nuts ?-he has been trying juicing.  ?-he has run out of his amlodipine ? ? ? ?Review of Systems ?+impaired balance and coordination ?   ?Objective:  ? Physical Exam ?Not performed as seen via phone ? ? ? ? ?   ?Assessment & Plan:  ?1) ICH ?-prescribed quad cane ?-continue outpatient therapy ?-commended on bike riding and encouraged exercise ? ?2) HTN ?-encouraged him to keep blood pressure log to share with me ?-increase clonidine patch to 0.2mg  per week ?-discontinue amlodipine.  ?-recommended eating 1 kiwi daily and adding nuts to diet ?-continue juicing, BP improving ? ?3) Atherosclerosis ?-encouraged coQ10 as recommended by his holistic practitioner. ? ?7 minutes spent in discussion of his hypertension, increasing clonidine patch to 0.2mg   53 weeks and discontinuing amlodipine, prescribed quad cane for his impaired mobility and ADLs and discussed with April ? ?

## 2021-04-30 ENCOUNTER — Other Ambulatory Visit: Payer: Self-pay

## 2021-04-30 ENCOUNTER — Encounter: Payer: Self-pay | Admitting: Occupational Therapy

## 2021-04-30 ENCOUNTER — Ambulatory Visit: Payer: Medicare HMO | Admitting: Occupational Therapy

## 2021-04-30 ENCOUNTER — Ambulatory Visit: Payer: Medicare HMO | Admitting: Physical Therapy

## 2021-04-30 DIAGNOSIS — R278 Other lack of coordination: Secondary | ICD-10-CM

## 2021-04-30 DIAGNOSIS — R41842 Visuospatial deficit: Secondary | ICD-10-CM

## 2021-04-30 DIAGNOSIS — M6281 Muscle weakness (generalized): Secondary | ICD-10-CM | POA: Diagnosis not present

## 2021-04-30 DIAGNOSIS — R2681 Unsteadiness on feet: Secondary | ICD-10-CM

## 2021-04-30 DIAGNOSIS — R2689 Other abnormalities of gait and mobility: Secondary | ICD-10-CM

## 2021-04-30 NOTE — Therapy (Deleted)
?  Aquatic Therapy: What to Expect! ? ?1st Pool Appointment is: 05/01/2021 at 11:45 ? ?Where:  ?[facility] ?[address] ?[place], [place]  [address] ?[phone] ? ?NOTE:  You will receive an automated phone message reminding you of your appointment and it will say the appointment is at the [facility] on [address]  Just know that you will meet [place] at the pool! ? ?How to Prepare: ?Please make sure you drink 8 ounces of water about one hour prior to your pool session ?A caregiver MUST attend the entire session with the patient.  The caregiver will be responsible for assisting with dressing as well as any toileting needs.  If the patient will be doing a home program this should likely be the person who will assist as well.  ?Patients must wear either their street shoes or pool shoes until they are ready to enter the pool with the therapist.  Patients must also wear either street shoes or pool shoes once exiting the pool to walk to the locker room.  This will helps [place] prevent slips and falls.  ?Please arrive 15 minutes early to prepare for your pool therapy session ?Sign in at the front desk on the clipboard marked for [place] ?You may use the locker rooms on your right and then enter directly into the recreation pool (NOT the competition pool) ?Please make sure to attend to any toileting needs prior to entering the pool ?Please be dressed in your swim suit and on the pool deck at least 5 minutes before your appointment ?Once on the pool deck your therapist will ask you to sign the Patient  Consent and Assignment of Benefits form ?Your therapist may take your blood pressure prior to, during and after your session if indicated ? ?About the pool  and parking: ?Entering the pool ?Your therapist will assist you; there are 2 ways to enter: stairs with railings or with a chair lift.   Your therapist will determine the most appropriate way for you. ?Water temperature is usually between  86-87 degrees ?Parking is free. ?   ?Contact Info:     Appointments: ?[place]  All sessions are 45 minutes   ?[place]     Please call the [facility] if   ?[place], [place]   [address]    you need to cancel or reschedule an appointment.  ?[phone]     ? ? ?

## 2021-04-30 NOTE — Therapy (Unsigned)
OUTPATIENT PHYSICAL THERAPY TREATMENT NOTE   Patient Name: Carlos Rice MRN: [id number] DOB:1970-03-11, 51 y.o., male Today's Date: 04/30/2021  PCP: Pcp, No REFERRING PROVIDER: Cathlyn Parsons, PA-C      Past Medical History:  Diagnosis Date   CKD (chronic kidney disease) stage 4, GFR 15-29 ml/min (HCC)    CVA (cerebral vascular accident) ([place])    High blood pressure    History of noncompliance with medical treatment    T2DM (type 2 diabetes mellitus) ([place])    Past Surgical History:  Procedure Laterality Date   AMPUTATION TOE Right    great toe   AMPUTATION TOE Left    5th toe   DG GREAT TOE RIGHT FOOT     great toe     ROTATOR CUFF REPAIR Right 2018   Patient Active Problem List   Diagnosis Date Noted   Anemia in chronic kidney disease 03/05/2021   Chronic kidney disease 03/05/2021   Diabetes ([place]) 03/05/2021   Non compliance w medication regimen 03/05/2021   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  PERTINENT HISTORY: CVAx7. Did have rehab in [place] with prior but was not happy with it.  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 No   TODAY'S TREATMENT:    04/30/2021:     04/26/21:  BP=170/100 Checked again after 5 min and was 162/100. Due to pt not having new BP and elevated BP treatment witheld. Pt going to pick up meds after session.   04/23/2021: BP: 160/90   Discussed aquatic therapy. Pt is interested in doing aquatic therapy on Tuesday mornings.  Discussed need to keep BP under control and to check BP prior to coming to aquatic; also discussed location of pool.  Pt to check with work to see if he would be able to have time off on Tuesday morning; looked at patient's schedule and PT  does switch to Tuesday pm; may be able to convert PT from pm to AM pool.  Pt to let PT know work's response to this request.  NMR: Transitioned to sitting in chair in front of mat with black theraband around thighs; performed isometric hip ABD while performing 8 reps sit > squat with UE support on mat in front to shift weight forwards.  Pt progressing from requiring min-mod A from PT to fully shift weight forwards over BOS to supervision.  Performed sustained squat with UE support with PT providing cues to shift COG slightly posterior over BOS and then performing lateral weight shifting in static squat.  Progressed and added to weight shifting by performing lateral stepping to L and R along mat with UE support on mat and therapist providing min A to maintain COG over BOS in squat position (pt tends to shift it too far forwards over toes) - performed x 3 laps down and back with black theraband around thighs.  Progressed side stepping to L and R in squat position by lifting UE off of mat with therapist providing increased support and facilitation of weight shifting at trunk/pelvis; also provided cues for sequencing of weight shifting, step initiation and foot clearance.  Pt fatigued more quickly without UE support.    Pt asking if he can take black theraband home and  perform LE exercises with it while at work; discussed with pt that at next PT visit will update HEP and provide pt with appropriate exercises and resistance band to use at home or work.  At end of session pt noted to ambulate with increased foot clearance when ambulating with rollator.    04/19/2021:   BP=160/90 BWS over treadmill with 30# unweighted 5 min x 2 at 0.21mph. 1 min longest bout without hands. 1 PT at trunk with walking with 1 UE support and 2 other PT on each leg without UE support. Mostly just SBA/CGA on RLE but mod assist at times on LLE for placement and clearance. Verbal and tactile cues at trunk for erect posture. Standing rest  break between bouts. BP=160/90 and HR=82 after Tall kneeling with large blue physioball in front: first working on upright posture x 30 sec, then moving ball forwards and back x 10 then to each side diagonal x 6, then no UE support x 30 sec, then alternating shoulder flexion x 10 with no UE support. Verbal cues to engage core throughout with close SBA/CGA.   PATIENT EDUCATION: Education details:  Person educated: Patient Education method: Explanation Education comprehension: verbalized understanding   HOME EXERCISE PROGRAM: [id number]   PT Short Term Goals - 04/16/21 1636       PT SHORT TERM GOAL #1   Title Pt will be able to perform initial HEP for strengthening and balance on own.    Time 4    Period Weeks    Status Achieved    Target Date 04/05/21      PT SHORT TERM GOAL #2   Title Pt will decrease 5 x sit to stand from 17.96 sec to<15 sec with improved control for improved balance and functional strength.    Baseline 03/07/21 17.96 sec without hands from chair > 21 seconds without UE from mat    Time 4    Period Weeks    Status Not Met    Target Date 04/05/21      PT SHORT TERM GOAL #3   Title Pt will be able to ambulate 400' on level surfaces with walker mod I for improved household mobility and short community distances.    Baseline supervision-min A for safety after longer distances    Time 4    Period Weeks    Status Not Met    Target Date 04/05/21      PT SHORT TERM GOAL #4   Title Pt will increase Berg from 28/56 to >34/56 for improved balance.    Baseline 03/07/21 28/56 > 31/56    Time 4    Period Weeks    Status Partially Met    Target Date 04/05/21              PT Long Term Goals - 03/08/21 1220       PT LONG TERM GOAL #1   Title Pt will be independent with progressive HEP for strengthening, balance and aerobic activity to continue gains on own.    Time 8    Period Weeks    Status New    Target Date 05/01/21      PT LONG TERM GOAL #2   Title  Pt will increase gait speed to >0.80m/s for improved gait safety.    Baseline 03/07/21 0.30m/s    Time 8    Period Weeks    Status New    Target Date 05/04/21      PT LONG TERM GOAL #  3   Title Pt will increase Berg from 28 to >40/56 for improved balance and decreased fall risk.    Baseline 03/07/21 28/56    Time 8    Period Weeks    Status New    Target Date 05/04/21      PT LONG TERM GOAL #4   Title Pt will ambulate >500' on varied surfaces with LRAD mod I for improved community access.    Time 8    Period Weeks    Status New    Target Date 05/04/21             Plan - 04/19/21 2017       Clinical Impression Statement .    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  HTN DM R Toe amputation, nonadherence to medications     Examination-Activity Limitations Locomotion Level;Transfers;Stairs;Stand;Dressing;Bend     Examination-Participation Restrictions Meal Prep;Cleaning;Community Activity;Yard Work     [employer] Evolving/Moderate complexity     Rehab Potential Good     PT Frequency 2x / week   plus eval    PT Duration 8 weeks     PT Treatment/Interventions ADLs/Self Care Home Management;Aquatic Therapy;DME Instruction;Gait training;Stair training;Functional mobility training;Therapeutic activities;Therapeutic exercise;Balance training;Neuromuscular re-education;Manual techniques;Passive range of motion;Vestibular;Patient/family education     PT Next Visit Plan 10th visit PN.  Monitor BP closely. Did he ask work about doing aquatic on Tuesday morning?    Update PT HEP and provide pt with exercises he can do at work (seated) with theraband if this is appropriate.  Continue BWS over treadmill as long as BP ok (used large harness with about 30# unweighted).Will need at least 2nd person on left foot to work on letting go on treadmill. Continue gait training with  pt's new rollator. Continue to work on engaging core with balance activities with slow, controlled movements-sustained squats, tall kneeling and quadruped as shoulder is able to tolerate. Try sitting balance on physioball?     Consulted and Agree with Plan of Care Patient   Rico Junker, PT, DPT 04/30/21    11:03 AM   04/30/21    11:03 AM

## 2021-04-30 NOTE — Therapy (Signed)
[facility], [place], [address] ?Phone: [phone]   Fax:  [phone] ? ?Occupational Therapy Treatment ? ?Patient Details  ?Name: Carlos Rice ?MRN: [id number] ?Date of Birth: February 20, 1970 ?Referring Provider (OT): [place] f/u ? ? ?Encounter Date: 04/30/2021 ? ? OT End of Session - 04/30/21 1617   ? ? Visit Number 12   ? Number of Visits 13   ? Date for OT Re-Evaluation 05/02/21   ? Authorization Type [employer]   ? Authorization Time Period $20 copay - Auth Req'd - VL:MN, 12 visits by 05/02/21   ? Authorization - Visit Number 11   ? Authorization - Number of Visits 12   ? OT Start Time 1615   ? OT Stop Time 1700   ? OT Time Calculation (min) 45 min   ? Activity Tolerance Patient tolerated treatment well   ? Behavior During Therapy [facility] for tasks assessed/performed   ? ?  ?  ? ?  ? ? ?Past Medical History:  ?Diagnosis Date  ? CKD (chronic kidney disease) stage 4, GFR 15-29 ml/min (HCC)   ? CVA (cerebral vascular accident) [facility])   ? High blood pressure   ? History of noncompliance with medical treatment   ? T2DM (type 2 diabetes mellitus) ([place])   ? ? ?Past Surgical History:  ?Procedure Laterality Date  ? AMPUTATION TOE Right   ? great toe  ? AMPUTATION TOE Left   ? 5th toe  ? DG GREAT TOE RIGHT FOOT    ? great toe    ? ROTATOR CUFF REPAIR Right 2018  ? ? ?There were no vitals filed for this visit. ? ? Subjective Assessment - 04/30/21 1617   ? ? Subjective  "I had to drive again today"   ? Pertinent History T2DM, CKD, CVA, HTN   ? Limitations Fall Risk. Ataxia RUE.   ? Patient Stated Goals "get off this walker and I just wanna walk" and then reported "I want to write" and "I want to walk around [facility]."   ? Currently in Pain? No/denies   ? Pain Score 0-No pain   ? ?  ?  ? ?  ? ? ? ? ?Hand Gripper: with RUE on level 3 with black spring. Pt picked up 1 inch blocks with gripper with min drops and min difficulty. ? ?Distal  Finger Control w RUE for increasing legibility and control with writing utensil and adapting strategies for controlling ataxia and coordination. Pt trialed built up foam grip and weighted utensil for RUE handwriting with increased legibility with use of strategies.  ? ?Grooved Pegs with RUE with max difficulty.  ? ? ? ? ? ? ? ? ? ? ? ? ? ? ? ? ? OT Short Term Goals - 04/23/21 1627   ? ?  ? OT SHORT TERM GOAL #1  ? Title Pt will be independent with HEP for coordination and grip strength   ? Time 4   ? Period Weeks   ? Status Achieved   ? Target Date 04/04/21   ?  ? OT SHORT TERM GOAL #2  ? Title Pt will demonstrate dynamic standing balance with no LOB in order to increase independence and safety with ADLs.   ? Time 4   ? Period Weeks   ? Status On-going   pt continues to be unsteady while standing d/t ataxia  ?  ? OT SHORT TERM GOAL #3  ? Title Pt  will increase Box and Blocks score, Bilterally, by 5 blocks   ? Baseline R 32, L 38   ? Time 4   ? Period Weeks   ? Status On-going   L 34, R 33 04/23/21  ?  ? OT SHORT TERM GOAL #4  ? Title Pt will demonstrate writing first and last name with 100% legibility with use of adapted strategies for ataxia   ? Time 4   ? Period Weeks   ? Status Achieved   ? ?  ?  ? ?  ? ? ? ? OT Long Term Goals - 04/23/21 1630   ? ?  ? OT LONG TERM GOAL #1  ? Title Pt will be independent with any updated HEPs   ? Time 8   ? Period Weeks   ? Status On-going   ? Target Date 05/02/21   ?  ? OT LONG TERM GOAL #2  ? Title Pt will increase coordination in RUE by completing 9 hole peg test in 75 seconds or less.   ? Baseline R 84s, L 45.28s   ? Time 8   ? Period Weeks   ? Status On-going   04/23/21 RUE 109.38s, RUE 68s on second attempt  ?  ? OT LONG TERM GOAL #3  ? Title Pt will increase legibiity of handwriting with use of strategies by writing 1-2 sentences with 100% legibility.   ? Time 8   ? Period Weeks   ? Status On-going   improved but not 100% legible, yet. 04/23/21  ?  ? OT LONG TERM GOAL #4  ?  Title Pt will verbalize understanding of adapted strategies for increasing independence and safety with ADLs and IADLs.   ? Time 8   ? Period Weeks   ? Status On-going   ?  ? OT LONG TERM GOAL #5  ? Title Pt will increase grip strength in RUE by 5 lbs or greater.   ? Baseline R 66.7, L 78   ? Time 8   ? Period Weeks   ? Status On-going   RUE 65.6lbs 04/23/21  ? ?  ?  ? ?  ? ? ? ? ? ? ? ? Plan - 04/30/21 1625   ? ? Clinical Impression Statement Pt continues to progress towards goals. Anticipate d/c next visit pending checking goals.   ? OT Occupational Profile and History Detailed Assessment- Review of Records and additional review of physical, cognitive, psychosocial history related to current functional performance   ? Occupational performance deficits (Please refer to evaluation for details): IADL's;ADL's;Leisure;Work   ? Body Structure / Function / Physical Skills ADL;Decreased knowledge of use of DME;Strength;Coordination;FMC;Flexibility;Mobility;ROM;IADL;Vision;Proprioception;UE functional use;GMC;Dexterity   ? Rehab Potential Good   ? Clinical Decision Making Several treatment options, min-mod task modification necessary   ? Comorbidities Affecting Occupational Performance: May have comorbidities impacting occupational performance   ? Modification or Assistance to Complete Evaluation  Min-Moderate modification of tasks or assist with assess necessary to complete eval   ? OT Frequency 2x / week   ? OT Duration 8 weeks   12 visits over 8 weeks d/t any scheduling conflicts.  ? OT Treatment/Interventions Self-care/ADL training;Splinting;DME and/or AE instruction;Fluidtherapy;Aquatic Therapy;Therapeutic activities;Therapeutic exercise;Manual Therapy;Patient/family education;Visual/perceptual remediation/compensation;Functional Mobility Training;Passive range of motion;Neuromuscular education   ? Plan anticipate d/c next session pending goals and progress   ? Consulted and Agree with Plan of Care Patient   ? ?  ?  ? ?   ? ? ?Patient will benefit  from skilled therapeutic intervention in order to improve the following deficits and impairments:   ?Body Structure / Function / Physical Skills: ADL, Decreased knowledge of use of DME, Strength, Coordination, FMC, Flexibility, Mobility, ROM, IADL, Vision, Proprioception, UE functional use, GMC, Dexterity ?  ?  ? ? ?Visit Diagnosis: ?Other abnormalities of gait and mobility ? ?Visuospatial deficit ? ?Other lack of coordination ? ?Muscle weakness (generalized) ? ?Unsteadiness on feet ? ? ? ?Problem List ?Patient Active Problem List  ? Diagnosis Date Noted  ? Anemia in chronic kidney disease 03/05/2021  ? Chronic kidney disease 03/05/2021  ? Diabetes ([place]) 03/05/2021  ? Non compliance w medication regimen 03/05/2021  ? 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 Visual field cut 03/05/2021  ? ICH (intracerebral hemorrhage) ([place]) 02/17/2021  ? ? ?Zachery Conch, OT ?04/30/2021, 4:31 PM ? ?[facility] ?[place], [place], [address] ?Phone: [phone]   Fax:  [phone] ? ?Name: Carlos Rice ?MRN: [id number] ?Date of Birth: [birth year] ? ?

## 2021-05-01 ENCOUNTER — Ambulatory Visit: Payer: Self-pay | Admitting: Physical Therapy

## 2021-05-01 DIAGNOSIS — M6281 Muscle weakness (generalized): Secondary | ICD-10-CM | POA: Diagnosis not present

## 2021-05-01 DIAGNOSIS — R2689 Other abnormalities of gait and mobility: Secondary | ICD-10-CM

## 2021-05-01 DIAGNOSIS — R2681 Unsteadiness on feet: Secondary | ICD-10-CM

## 2021-05-01 DIAGNOSIS — R278 Other lack of coordination: Secondary | ICD-10-CM

## 2021-05-01 DIAGNOSIS — R270 Ataxia, unspecified: Secondary | ICD-10-CM

## 2021-05-01 NOTE — Therapy (Signed)
?OUTPATIENT PHYSICAL THERAPY TREATMENT NOTE and 10th Visit Progress Note ? ? ?Patient Name: Carlos Rice ?MRN: [id number] ?DOB:03-28-1970, 51 y.o., male ?Today's Date: 05/01/2021 ? ?PCP: Pcp, No ?REFERRING PROVIDER: Cathlyn Parsons, PA-C ? ?Progress Note ? ?Reporting Period 03/07/2021 to 05/01/2021 ? ?See note below for Objective Data and Assessment of Progress/Goals.  ? ? ? PT End of Session - 05/01/21 1338   ? ? Visit Number 10   ? Number of Visits 17   ? Date for PT Re-Evaluation 05/04/21   ? Authorization Type [employer] Medicare; $20 copay per discipline; 10th visit PN, 17 visits 1/18-3/17/23   ? Authorization Time Period 17 visits approved from 1/18 - 3/17   ? Authorization - Visit Number 10   ? Authorization - Number of Visits 17   ? Progress Note Due on Visit 20   ? PT Start Time 1155   ? PT Stop Time 1235   ? PT Time Calculation (min) 40 min   ? Activity Tolerance Patient tolerated treatment well   ? Behavior During Therapy [facility] for tasks assessed/performed   ? ?  ?  ? ?  ? ? ? ? ?Past Medical History:  ?Diagnosis Date  ? CKD (chronic kidney disease) stage 4, GFR 15-29 ml/min (HCC)   ? CVA (cerebral vascular accident) [facility])   ? High blood pressure   ? History of noncompliance with medical treatment   ? T2DM (type 2 diabetes mellitus) ([place])   ? ?Past Surgical History:  ?Procedure Laterality Date  ? AMPUTATION TOE Right   ? great toe  ? AMPUTATION TOE Left   ? 5th toe  ? DG GREAT TOE RIGHT FOOT    ? great toe    ? ROTATOR CUFF REPAIR Right 2018  ? ?Patient Active Problem List  ? Diagnosis Date Noted  ? Anemia in chronic kidney disease 03/05/2021  ? Chronic kidney disease 03/05/2021  ? Diabetes ([place]) 03/05/2021  ? Non compliance w medication regimen 03/05/2021  ? 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 Visual field cut 03/05/2021  ? ICH (intracerebral hemorrhage) ([place]) 02/17/2021  ? ? ?REFERRING DIAG: ICH ? ?THERAPY DIAG:  ?Other abnormalities of gait and  mobility ? ?Ataxia ? ?Unsteadiness on feet ? ?Muscle weakness (generalized) ? ?Other lack of coordination ? ?PERTINENT HISTORY: CVAx7. Did have rehab in [place] with prior but was not happy with it.  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 ?PRECAUTIONS: fall ? ?SUBJECTIVE: Came yesterday to make sure he could find the building.  It was a long walk back to the pool but pt reports he wants to walk more.  Excited to try aquatic therapy today.  BP is better with new patch.   ? ?PAIN:  ?Are you having pain? No ? ? ?TODAY'S TREATMENT:  ? ? 05/01/2021: ?Patient seen for aquatic therapy today.  Treatment took place in water 3-4 feet deep depending upon activity.  Pt entered and exited the pool via stairs with bilat rails and required HHA to safely ambulate from bench > rails due to L foot catching on rubber mat and near tripping. ? ?Initiated gait training across width of pool in 3.5 ft with use of aqua dumbbells for UE support; focused on reciprocal UE swing and increasing step and stride length bilaterally.  Performed 4 laps down and back with PT providing min-mod A for balance.  Changed to L and R side stepping; attempted to involve bilat  UE by performing UE ABD<>ADD when performing LE ABD<>ADD but pt unable to coordinate safely, changed to just side stepping with PT providing verbal cues for full lateral weight shift prior to stepping.  Changed to forward high knee marching across width of pool with PT providing min-mod A for balance. ? ?Transitioned to balance reaction and weight shift training standing with back to pool wall and holding aqua dumbbells out in front of pt in flexion- performed leaning/reaching forwards with transition to anterior weight shift away from wall by extending hips and trunk (instead of relying on UE pulling) with controlled weight shift back to wall.  Required multiple attempts and PT providing mod A to prevent genu recurvatum and retropulsion.  Holding aqua  dumbbells in ABD to each side, performed lateral weight shifts reaching out to L and R with UE and trunk elongation to facilitate head righting; pt had greater difficulty with shortening on R side.  Transitioned away from the wall and stood with feet close together.  Cued to maintain balance while performing bilat UE ABD <>ADD, UE alternating flex<>ext, and UE/trunk L and R rotation x 10 reps each with mod A from PT for balance.   ? ?Performed R and L gastroc and hamstring stretch with heel prop on first stair with trunk flexion x 30 seconds each LE. ? ?Pt requires buoyancy of water for support for joint offloading for pain reduction in body weight support and proprioception, to reduce fall risk with gait training and balance exercises with minimal UE support; exercises able to be performed safely in water without the risk of fall compared to those same exercises performed on land;  viscosity of water needed for resistance for strengthening.  Current of water provides perturbations for challenging static & dynamic standing balance. ? ? ?04/26/21: ? BP=170/100 ?Checked again after 5 min and was 162/100. Due to pt not having new BP and elevated BP treatment witheld. Pt going to pick up meds after session.  ? ?04/23/2021: ?BP: 160/90   ?Discussed aquatic therapy. Pt is interested in doing aquatic therapy on Tuesday mornings.  Discussed need to keep BP under control and to check BP prior to coming to aquatic; also discussed location of pool.  Pt to check with work to see if he would be able to have time off on Tuesday morning; looked at patient's schedule and PT does switch to Tuesday pm; may be able to convert PT from pm to AM pool.  Pt to let PT know work's response to this request. ? ?NMR: Transitioned to sitting in chair in front of mat with black theraband around thighs; performed isometric hip ABD while performing 8 reps sit > squat with UE support on mat in front to shift weight forwards.  Pt progressing from  requiring min-mod A from PT to fully shift weight forwards over BOS to supervision.  Performed sustained squat with UE support with PT providing cues to shift COG slightly posterior over BOS and then performing lateral weight shifting in static squat.  Progressed and added to weight shifting by performing lateral stepping to L and R along mat with UE support on mat and therapist providing min A to maintain COG over BOS in squat position (pt tends to shift it too far forwards over toes) - performed x 3 laps down and back with black theraband around thighs.  Progressed side stepping to L and R in squat position by lifting UE off of mat with therapist providing increased support and facilitation of  weight shifting at trunk/pelvis; also provided cues for sequencing of weight shifting, step initiation and foot clearance.  Pt fatigued more quickly without UE support.   ? ?Pt asking if he can take black theraband home and perform LE exercises with it while at work; discussed with pt that at next PT visit will update HEP and provide pt with appropriate exercises and resistance band to use at home or work. ? ?At end of session pt noted to ambulate with increased foot clearance when ambulating with rollator.   ? ?04/19/2021:  ? ?BP=160/90 ?BWS over treadmill with 30# unweighted 5 min x 2 at 0.33mph. 1 min longest bout without hands. 1 PT at trunk with walking with 1 UE support and 2 other PT on each leg without UE support. Mostly just SBA/CGA on RLE but mod assist at times on LLE for placement and clearance. Verbal and tactile cues at trunk for erect posture. Standing rest break between bouts. BP=160/90 and HR=82 after ?Tall kneeling with large blue physioball in front: first working on upright posture x 30 sec, then moving ball forwards and back x 10 then to each side diagonal x 6, then no UE support x 30 sec, then alternating shoulder flexion x 10 with no UE support. Verbal cues to engage core throughout with close  SBA/CGA. ? ? ?PATIENT EDUCATION: ?Education details: Will discuss more aquatic visits at next land visit ?Person educated: Patient ?Education method: Explanation ?Education comprehension: verbalized understanding ? ? ?HOME EXERCISE PROGRAM: ?N

## 2021-05-03 ENCOUNTER — Ambulatory Visit: Payer: Medicare HMO

## 2021-05-03 ENCOUNTER — Ambulatory Visit: Payer: Medicare HMO | Admitting: Occupational Therapy

## 2021-05-03 ENCOUNTER — Encounter: Payer: Self-pay | Admitting: Occupational Therapy

## 2021-05-03 ENCOUNTER — Other Ambulatory Visit: Payer: Self-pay

## 2021-05-03 VITALS — BP 180/100

## 2021-05-03 DIAGNOSIS — M6281 Muscle weakness (generalized): Secondary | ICD-10-CM | POA: Diagnosis not present

## 2021-05-03 NOTE — Therapy (Signed)
[facility] ?[address] ?[place], [place], [address] ?Phone: [phone]   Fax:  [phone] ? ?Occupational Therapy Treatment & Discharge ? ?Patient Details  ?Name: Carlos Rice ?MRN: [id number] ?Date of Birth: 04/10/1970 ?Referring Provider (OT): Angiulli - Raulkar f/u ? ? ?Encounter Date: 05/03/2021 ? ? OT End of Session - 05/03/21 1543   ? ? Visit Number 13   ? Number of Visits 13   ? Date for OT Re-Evaluation 05/02/21   ? Authorization Type [employer]   ? Authorization Time Period $20 copay - Auth Req'd - VL:MN, 12 visits by 05/02/21   ? Authorization - Visit Number 12   ? Authorization - Number of Visits 12   ? OT Start Time 1542   arrival time  ? OT Stop Time 1615   ? OT Time Calculation (min) 33 min   ? Activity Tolerance Patient tolerated treatment well   ? Behavior During Therapy WFL for tasks assessed/performed   ? ?  ?  ? ?  ? ?OCCUPATIONAL THERAPY DISCHARGE SUMMARY ? ?Visits from Start of Care: 13 ? ?Current functional level related to goals / functional outcomes: ?improved with understanding of strategies and education re: how to manage ataxia.  ?  ?Remaining deficits: ?Pt continues with significant RUE ataxia and poor coordination with RUE and unsteadiness on feet. ?  ?Education / Equipment: ?Education and HEPs  ? ?Patient agrees to discharge. Patient goals were partially met. Patient is being discharged due to maximized rehab potential. . ? ? ? ? ? ?Past Medical History:  ?Diagnosis Date  ? CKD (chronic kidney disease) stage 4, GFR 15-29 ml/min (HCC)   ? CVA (cerebral vascular accident) (HCC)   ? High blood pressure   ? History of noncompliance with medical treatment   ? T2DM (type 2 diabetes mellitus) (HCC)   ? ? ?Past Surgical History:  ?Procedure Laterality Date  ? AMPUTATION TOE Right   ? great toe  ? AMPUTATION TOE Left   ? 5th toe  ? DG GREAT TOE RIGHT FOOT    ? great toe    ? ROTATOR CUFF REPAIR Right 2018  ? ? ?There were no  vitals filed for this visit. ? ? Subjective Assessment - 05/03/21 1543   ? ? Subjective  "I did aquatics therapy"   ? Pertinent History T2DM, CKD, CVA, HTN   ? Limitations Fall Risk. Ataxia RUE.   ? Patient Stated Goals "get off this walker and I just wanna walk" and then reported "I want to write" and "I want to walk around [address]   ? Currently in Pain? No/denies   ? Pain Score 0-No pain   ? ?  ?  ? ?  ? ? ?Reviewed and checked goals. ? ? ?Arm Bike: for conditioning and reciprocal movements on Level 2 for 6 minutes (forward and backward) ? ? ? ? ? ? ? ? ? ? ? ? ? ? ? ? ? ? ? ? OT Short Term Goals - 05/03/21 1549   ? ?  ? OT SHORT TERM GOAL #1  ? Title Pt will be independent with HEP for coordination and grip strength   ? Time 4   ? Period Weeks   ? Status Achieved   ? Target Date 04/04/21   ?  ? OT SHORT TERM GOAL #2  ? Title Pt will demonstrate dynamic standing balance with no LOB in order to increase independence and safety with ADLs.   ?   Time 4   ? Period Weeks   ? Status Not Met   pt with some LOB d/t ataxia.  ?  ? OT SHORT TERM GOAL #3  ? Title Pt will increase Box and Blocks score, Bilterally, by 5 blocks   ? Baseline R 32, L 38   ? Time 4   ? Period Weeks   ? Status Not Met   L 42, R 32 05/03/21  ?  ? OT SHORT TERM GOAL #4  ? Title Pt will demonstrate writing first and last name with 100% legibility with use of adapted strategies for ataxia   ? Time 4   ? Period Weeks   ? Status Achieved   ? ?  ?  ? ?  ? ? ? ? OT Long Term Goals - 05/03/21 1552   ? ?  ? OT LONG TERM GOAL #1  ? Title Pt will be independent with any updated HEPs   ? Time 8   ? Period Weeks   ? Status Achieved   ? Target Date 05/02/21   ?  ? OT LONG TERM GOAL #2  ? Title Pt will increase coordination in RUE by completing 9 hole peg test in 75 seconds or less.   ? Baseline R 84s, L 45.28s   ? Time 8   ? Period Weeks   ? Status Achieved   05/03/21 RUE 76.69s on second attempt  ?  ? OT LONG TERM GOAL #3  ? Title Pt will increase legibiity of  handwriting with use of strategies by writing 1-2 sentences with 100% legibility.   ? Time 8   ? Period Weeks   ? Status Not Met   improved but not 100% legible, yet. 04/23/21, 05/03/21  ?  ? OT LONG TERM GOAL #4  ? Title Pt will verbalize understanding of adapted strategies for increasing independence and safety with ADLs and IADLs.   ? Time 8   ? Period Weeks   ? Status Achieved   ?  ? OT LONG TERM GOAL #5  ? Title Pt will increase grip strength in RUE by 5 lbs or greater.   ? Baseline R 66.7, L 78   ? Time 8   ? Period Weeks   ? Status Not Met   RUE 63.9 05/03/21  ? ?  ?  ? ?  ? ? ? ? ? ? ? ? Plan - 05/03/21 1601   ? ? Clinical Impression Statement Pt has met and maximized rehab potential with OT at this time. Pt has reviewed strateiges for managing ataxia but continues to have deficits with RUE coordination and strength.   ? OT Occupational Profile and History Detailed Assessment- Review of Records and additional review of physical, cognitive, psychosocial history related to current functional performance   ? Occupational performance deficits (Please refer to evaluation for details): IADL's;ADL's;Leisure;Work   ? Body Structure / Function / Physical Skills ADL;Decreased knowledge of use of DME;Strength;Coordination;FMC;Flexibility;Mobility;ROM;IADL;Vision;Proprioception;UE functional use;GMC;Dexterity   ? Rehab Potential Good   ? Clinical Decision Making Several treatment options, min-mod task modification necessary   ? Comorbidities Affecting Occupational Performance: May have comorbidities impacting occupational performance   ? Modification or Assistance to Complete Evaluation  Min-Moderate modification of tasks or assist with assess necessary to complete eval   ? OT Frequency 2x / week   ? OT Duration 8 weeks   12 visits over 8 weeks d/t any scheduling conflicts.  ? OT Treatment/Interventions Self-care/ADL training;Splinting;DME and/or AE  instruction;Fluidtherapy;Aquatic Therapy;Therapeutic activities;Therapeutic  exercise;Manual Therapy;Patient/family education;Visual/perceptual remediation/compensation;Functional Mobility Training;Passive range of motion;Neuromuscular education   ? Plan OT d/c   ? Consulted and Agree with Plan of Care Patient   ? ?  ?  ? ?  ? ? ?Patient will benefit from skilled therapeutic intervention in order to improve the following deficits and impairments:   ?Body Structure / Function / Physical Skills: ADL, Decreased knowledge of use of DME, Strength, Coordination, FMC, Flexibility, Mobility, ROM, IADL, Vision, Proprioception, UE functional use, GMC, Dexterity ?  ?  ? ? ?Visit Diagnosis: ?Other abnormalities of gait and mobility ? ?Unsteadiness on feet ? ?Muscle weakness (generalized) ? ?Ataxia ? ?Other lack of coordination ? ?Visuospatial deficit ? ? ? ?Problem List ?Patient Active Problem List  ? Diagnosis Date Noted  ? Anemia in chronic kidney disease 03/05/2021  ? Chronic kidney disease 03/05/2021  ? Diabetes ([place]) 03/05/2021  ? Non compliance w medication regimen 03/05/2021  ? 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 Visual field cut 03/05/2021  ? ICH (intracerebral hemorrhage) ([place]) 02/17/2021  ? ? ?Zachery Conch, OT ?05/03/2021, 4:09 PM ? ?[facility] [facility] ?[place], [place], [address] ?Phone: [phone]   Fax:  [phone] ? ?Name: Lew Prout ?MRN: [id number] ?Date of Birth: 1970/08/01 ? ?

## 2021-05-03 NOTE — Therapy (Signed)
?OUTPATIENT PHYSICAL THERAPY TREATMENT NOTE - arrived no charge ? ? ?Patient Name: Carlos Rice ?MRN: [id number] ?DOB:Dec 07, 1970, 51 y.o., male ?Today's Date: 05/03/2021 ? ?PCP: Pcp, No ?REFERRING PROVIDER: Cathlyn Parsons, PA-C ? ? ? ? ? PT End of Session - 05/03/21 1615   ? ? Visit Number 10   ? Number of Visits 17   ? Date for PT Re-Evaluation 05/04/21   ? Authorization Type [employer]; $20 copay per discipline; 10th visit PN, 17 visits 1/18-3/17/23   ? Authorization Time Period 17 visits approved from 1/18 - 3/17   ? Authorization - Visit Number 11   ? Authorization - Number of Visits 17   ? Progress Note Due on Visit 20   ? PT Start Time 1615   ? PT Stop Time 9977   ? PT Time Calculation (min) 16 min   ? Activity Tolerance Patient tolerated treatment well   ? Behavior During Therapy [facility] for tasks assessed/performed   ? ?  ?  ? ?  ? ? ? ? ?Past Medical History:  ?Diagnosis Date  ? CKD (chronic kidney disease) stage 4, GFR 15-29 ml/min (HCC)   ? CVA (cerebral vascular accident) [facility])   ? High blood pressure   ? History of noncompliance with medical treatment   ? T2DM (type 2 diabetes mellitus) ([place])   ? ?Past Surgical History:  ?Procedure Laterality Date  ? AMPUTATION TOE Right   ? great toe  ? AMPUTATION TOE Left   ? 5th toe  ? DG GREAT TOE RIGHT FOOT    ? great toe    ? ROTATOR CUFF REPAIR Right 2018  ? ?Patient Active Problem List  ? Diagnosis Date Noted  ? Anemia in chronic kidney disease 03/05/2021  ? Chronic kidney disease 03/05/2021  ? Diabetes ([place]) 03/05/2021  ? Non compliance w medication regimen 03/05/2021  ? 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 Visual field cut 03/05/2021  ? ICH (intracerebral hemorrhage) ([place]) 02/17/2021  ? ? ?REFERRING DIAG: ICH ? ?THERAPY DIAG:  ?Other abnormalities of gait and mobility ? ?PERTINENT HISTORY: CVAx7. Did have rehab in [place] with prior but was not happy with it.  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 ?PRECAUTIONS: fall ? ?SUBJECTIVE: Pt reports that he is doing ok today. Was a weird day. Pt reports that his dog kept bothering him as he does this when BP is elevated yesterday. Pt realized that his blood pressure patch came off yesterday. He reapplied when realized.  ? ?PAIN:  ?Are you having pain? No ?BP=180/98. BP=180/100 ? ?TODAY'S TREATMENT:  ? ?  ? ? ? ? ? ? ?PATIENT EDUCATION: ?Education details: Will discuss more aquatic visits at next land visit. Pt to call Dr. Ranell Patrick about high BP issues when leaves. ?Person educated: Patient ?Education method: Explanation ?Education comprehension: verbalized understanding ? ? ?HOME EXERCISE PROGRAM: ?[id number] ? ? PT Short Term Goals - 04/16/21 1636   ? ?  ? PT SHORT TERM GOAL #1  ? Title Pt will be able to perform initial HEP for strengthening and balance on own.   ? Time 4   ? Period Weeks   ? Status Achieved   ? Target Date 04/05/21   ?  ? PT SHORT TERM GOAL #2  ? Title Pt will decrease 5 x sit to stand from 17.96 sec to<15 sec with improved control for improved balance and functional strength.   ?  Baseline 03/07/21 17.96 sec without hands from chair > 21 seconds without UE from mat   ? Time 4   ? Period Weeks   ? Status Not Met   ? Target Date 04/05/21   ?  ? PT SHORT TERM GOAL #3  ? Title Pt will be able to ambulate 400' on level surfaces with walker mod I for improved household mobility and short community distances.   ? Baseline supervision-min A for safety after longer distances   ? Time 4   ? Period Weeks   ? Status Not Met   ? Target Date 04/05/21   ?  ? PT SHORT TERM GOAL #4  ? Title Pt will increase Berg from 28/56 to >34/56 for improved balance.   ? Baseline 03/07/21 28/56 > 31/56   ? Time 4   ? Period Weeks   ? Status Partially Met   ? Target Date 04/05/21   ? ?  ?  ? ?  ? ? ? PT Long Term Goals - 03/08/21 1220   ? ?  ? PT LONG TERM GOAL #1  ? Title Pt will be independent with progressive HEP for strengthening, balance and aerobic activity to  continue gains on own.   ? Time 8   ? Period Weeks   ? Status New   ? Target Date 05/01/21   ?  ? PT LONG TERM GOAL #2  ? Title Pt will increase gait speed to >0.78m/s for improved gait safety.   ? Baseline 03/07/21 0.46m/s   ? Time 8   ? Period Weeks   ? Status New   ? Target Date 05/04/21   ?  ? PT LONG TERM GOAL #3  ? Title Pt will increase Berg from 28 to >40/56 for improved balance and decreased fall risk.   ? Baseline 03/07/21 28/56   ? Time 8   ? Period Weeks   ? Status New   ? Target Date 05/04/21   ?  ? PT LONG TERM GOAL #4  ? Title Pt will ambulate >500' on varied surfaces with LRAD mod I for improved community access.   ? Time 8   ? Period Weeks   ? Status New   ? Target Date 05/04/21   ? ?  ?  ? ?  ? ? ?Plan - 04/19/21 2017   ?  ?  Clinical Impression Statement Treatment witheld due to elevated BP.  ?  Personal Factors and Comorbidities Comorbidity 3+;Behavior Pattern;Past/Current Experience   ?  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  Examination-Activity Limitations Locomotion Level;Transfers;Stairs;Stand;Dressing;Bend   ?  Examination-Participation Restrictions Meal Prep;Cleaning;Community Activity;[place] Work   ?  Stability/Clinical Decision Making Evolving/Moderate complexity   ?  Rehab Potential Good   ?  PT Frequency 2x / week   plus eval  ?  PT Duration 8 weeks   ?  PT Treatment/Interventions ADLs/Self Care Home Management;Aquatic Therapy;DME Instruction;Gait training;Stair training;Functional mobility training;Therapeutic activities;Therapeutic exercise;Balance training;Neuromuscular re-education;Manual techniques;Passive range of motion;Vestibular;Patient/family education   ?  PT Next Visit Plan Monitor BP closely.   ? ?Check LTG, recert and request more visits from [place] can probably do one more pool on 28th and then switch to Monday, Wed PM or wait until Juliann Pulse can do Tuesday morning? ? ?Update PT HEP and provide pt with exercises he  can do at work (seated) with theraband if this is appropriate. ? ?Continue BWS over  treadmill as long as BP ok (used large harness with about 30# unweighted).Will need at least 2nd person on left foot to work on letting go on treadmill. Continue gait training with pt's new rollator. Continue to work on engaging core with balance activities with slow, controlled movements-sustained squats, tall kneeling and quadruped as shoulder is able to tolerate. Try sitting balance on physioball?   ?  Consulted and Agree with Plan of Care Patient  ? ?Cherly Anderson, PT, DPT, NCS ? ?05/03/21    6:59 PM ? ? ? ? ? ?  ? ?

## 2021-05-04 ENCOUNTER — Encounter (HOSPITAL_BASED_OUTPATIENT_CLINIC_OR_DEPARTMENT_OTHER): Payer: Medicare HMO | Admitting: Physical Medicine and Rehabilitation

## 2021-05-04 DIAGNOSIS — I10 Essential (primary) hypertension: Secondary | ICD-10-CM

## 2021-05-04 NOTE — Progress Notes (Signed)
? ?  Subjective:  ? ? Patient ID: Carlos Rice, male    DOB: 1970/03/06, 51 y.o.   MRN: [id number] ? ?HPI ?An audio/video tele-health visit is felt to be the most appropriate encounter for this patient at this time. This is a follow up tele-visit via phone. The patient is at home. MD is at office. Prior to scheduling this appointment, our staff discussed the limitations of evaluation and management by telemedicine and the availability of in-person appointments. The patient expressed understanding and agreed to proceed.  ? ?Carlos Rice is a 51 year old man presenting for f/u of ICH, impaired mobility, and HTN ? ?1) ICH ?-started outpatient therapy and had a good eval day ?-he hopes they work him as hard as he worked in inpatient rehab ?-he was able to bike 2 miles and his son felt this was too much.  ?-he feels he is walked too slowly with his walker and would like a quad cane. Has been walking slowly with quad cane to avoid falling ?-missed therapy yesterday since his mother did not want to drive in the heavy rain ?-he has been doing core and leg exercises ? ?2) HTN ?-BP is at best level it has been since leaving the hospital. 093A systolic this morning, at worst it is 355D systolic ?-was very elevated yesterday to 200/100 and he had to miss therapy as a result. He realized that his clonidine patch had fallen off and he had accidentally replaced it with one of the 0.1mg  patches. He will be more vigilant about checking his blood pressure regularly this week.  ?-he has been eating kiwis, has not been eating nuts ?-he has been trying juicing.  ?-he has run out of his amlodipine ? ? ? ?Review of Systems ?+impaired balance and coordination ?   ?Objective:  ? Physical Exam ?Not performed as seen via phone ? ? ? ? ?   ?Assessment & Plan:  ?1) ICH ?-prescribed quad cane, advised to use walker when alone and quad cane with family ?-discussed his progress with therapy this far ?-continue outpatient therapy ?-commended on bike  riding and encouraged exercise ? ?2) HTN ?-encouraged him to keep blood pressure log to share with me ?-increase clonidine patch to 0.2mg  per week, advised that it will take time for new patch to get into his system and to notify me of his blood pressures this week ?-discontinue amlodipine.  ?-recommended eating 1 kiwi daily and adding nuts to diet ?-continue juicing, BP improving ? ?3) Atherosclerosis ?-encouraged coQ10 as recommended by his holistic practitioner. ? ?7 minutes spent in discussion of his blood pressure, reapplying the clonidine patch, that it may take some time for him to reach steady state ? ?

## 2021-05-08 ENCOUNTER — Ambulatory Visit: Payer: Medicare HMO

## 2021-05-08 ENCOUNTER — Telehealth: Payer: Self-pay

## 2021-05-08 NOTE — Telephone Encounter (Signed)
PT called pt to check on him as he cancelled due to not feeling well and requested a call back. Left message on voicemail. Let him know next appointment was scheduled for Thursday. ?Cherly Anderson, PT, DPT, NCS ? ?

## 2021-05-09 ENCOUNTER — Other Ambulatory Visit: Payer: Self-pay | Admitting: Physical Medicine and Rehabilitation

## 2021-05-10 ENCOUNTER — Ambulatory Visit: Payer: Medicare HMO

## 2021-05-10 ENCOUNTER — Encounter: Payer: Medicare HMO | Admitting: Occupational Therapy

## 2021-05-15 ENCOUNTER — Other Ambulatory Visit: Payer: Self-pay

## 2021-05-15 ENCOUNTER — Ambulatory Visit: Payer: Medicare HMO

## 2021-05-15 ENCOUNTER — Other Ambulatory Visit: Payer: Self-pay | Admitting: Physical Medicine and Rehabilitation

## 2021-05-15 ENCOUNTER — Ambulatory Visit: Payer: Medicare HMO | Admitting: Physical Therapy

## 2021-05-15 ENCOUNTER — Encounter (HOSPITAL_BASED_OUTPATIENT_CLINIC_OR_DEPARTMENT_OTHER): Payer: Medicare HMO | Admitting: Physical Medicine and Rehabilitation

## 2021-05-15 VITALS — BP 173/107 | HR 88

## 2021-05-15 DIAGNOSIS — I10 Essential (primary) hypertension: Secondary | ICD-10-CM

## 2021-05-15 DIAGNOSIS — R2689 Other abnormalities of gait and mobility: Secondary | ICD-10-CM

## 2021-05-15 MED ORDER — CLONIDINE HCL 0.2 MG/24HR TD PTWK: 0.2000 mg | MEDICATED_PATCH | TRANSDERMAL | 11 refills | Status: AC

## 2021-05-15 NOTE — Progress Notes (Signed)
? ?  Subjective:  ? ? Patient ID: Carlos Rice, male    DOB: 1970-06-27, 51 y.o.   MRN: [id number] ? ?HPI ?An audio/video tele-health visit is felt to be the most appropriate encounter for this patient at this time. This is a follow up tele-visit via phone. The patient is at home. MD is at office. Prior to scheduling this appointment, our staff discussed the limitations of evaluation and management by telemedicine and the availability of in-person appointments. The patient expressed understanding and agreed to proceed.  ? ?Carlos Rice is a 51 year old man presenting for f/u of ICH, impaired mobility, and HTN ? ?1) ICH ?-he feels ready to graduate from outpatient rehab soon and work on his own exercise program. ?-he was able to bike 2 miles and his son felt this was too much.  ?-Has been walking slowly with quad cane to avoid falling. He would like to progress faster ?-he has been doing core and leg exercises ? ?2) HTN ?-He was having hard time getting clonidine patch covered, but it appears that the issue was the pharmacy it was send to ?-Bp has his clonidine patch had fallen off and he had accidentally replaced it with one of the 0.1mg  patches. He will be more vigilant about checking his blood pressure regularly this week.  ?-he has been eating kiwis, has not been eating nuts ?-he has been trying juicing.  ?-he is currently not taking amlodipine ? ? ? ?Review of Systems ?+impaired balance and coordination ?   ?Objective:  ? Physical Exam ?Not performed as seen via phone ? ? ? ? ?   ?Assessment & Plan:  ?1) ICH ?-prescribed quad cane, advised to use walker when alone and quad cane with family ?-discussed his progress with therapy this far ?-continue outpatient therapy ?-commended on bike riding and encouraged exercise ? ?2) HTN ?-encouraged him to keep blood pressure log to share with me ?-increase clonidine patch to 0.2mg  per week, advised that it will take time for new patch to get into his system and to notify me  of his blood pressures this week. Appears that issue of authorization has resolved ?-keep amlodipine on hand should he run into a similar issue in which clonidine patch falls off and he is out of medication,  ?-recommended eating 1 kiwi daily and adding nuts to diet ?-continue juicing, BP improving ? ?3) Atherosclerosis ?-encouraged coQ10 as recommended by his holistic practitioner. ? ?5 minutes spent in discussion of sending clonidine 0.2mg  patch to his pharmacy, amlodipine 10mg  to use PRN in the case that patch falls off in the future and he is experiencing high blood pressures without medication ? ?

## 2021-05-15 NOTE — Therapy (Signed)
?OUTPATIENT PHYSICAL THERAPY TREATMENT NOTE - arrived no charge ? ? ?Patient Name: Carlos Rice ?MRN: [id number] ?DOB:30-Mar-1970, 51 y.o., male ?Today's Date: 05/15/2021 ? ?PCP: Pcp, No ?REFERRING PROVIDER: Cathlyn Parsons, PA-C ? ? ? ? ? PT End of Session - 05/15/21 1347   ? ? Visit Number 10   arrive no charge  ? Number of Visits 17   ? Date for PT Re-Evaluation 05/04/21   ? Authorization Type [employer]; $20 copay per discipline; 10th visit PN, 17 visits 1/18-3/17/23   ? Authorization Time Period 17 visits approved from 1/18 - 3/17   ? Authorization - Visit Number 11   ? Authorization - Number of Visits 17   ? Progress Note Due on Visit 20   ? Activity Tolerance Treatment limited secondary to medical complications (Comment)   ? ?  ?  ? ?  ? ? ? ? ?Past Medical History:  ?Diagnosis Date  ? CKD (chronic kidney disease) stage 4, GFR 15-29 ml/min (HCC)   ? CVA (cerebral vascular accident) [facility])   ? High blood pressure   ? History of noncompliance with medical treatment   ? T2DM (type 2 diabetes mellitus) ([place])   ? ?Past Surgical History:  ?Procedure Laterality Date  ? AMPUTATION TOE Right   ? great toe  ? AMPUTATION TOE Left   ? 5th toe  ? DG GREAT TOE RIGHT FOOT    ? great toe    ? ROTATOR CUFF REPAIR Right 2018  ? ?Patient Active Problem List  ? Diagnosis Date Noted  ? Anemia in chronic kidney disease 03/05/2021  ? Chronic kidney disease 03/05/2021  ? Diabetes ([place]) 03/05/2021  ? Non compliance w medication regimen 03/05/2021  ? 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 Visual field cut 03/05/2021  ? ICH (intracerebral hemorrhage) ([place]) 02/17/2021  ? ? ?REFERRING DIAG: ICH ? ?THERAPY DIAG:  ?Other abnormalities of gait and mobility ? ?PERTINENT HISTORY: CVAx7. Did have rehab in [place] with prior but was not happy with it.  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 ?PRECAUTIONS: fall ? ?SUBJECTIVE:  Pt presents to pool for aquatic therapy.  Pt  reports at last land visit he had the wrong dosage patch on.  Assessed resting BP with wrist BP cuff and UE digital cuff, both reading Systolic >676 and Diastolic >195.  Pt reports he is wearing the correct dosage patch but today is last day to wear it and tomorrow he will apply a new one.  Unable to complete aquatic therapy due to effect of hydrostatic pressure on HTN.  Will re-assess at next land visit. ? ?PAIN:  ?Are you having pain? No ?BP=180/98. BP=180/100 ? ?TODAY'S TREATMENT:  ? ? ?PATIENT EDUCATION: ?Education details: None ?Person educated: Patient ?Education method: Explanation ?Education comprehension: verbalized understanding ? ? ?HOME EXERCISE PROGRAM: ?[id number] ? ? PT Short Term Goals - 04/16/21 1636   ? ?  ? PT SHORT TERM GOAL #1  ? Title Pt will be able to perform initial HEP for strengthening and balance on own.   ? Time 4   ? Period Weeks   ? Status Achieved   ? Target Date 04/05/21   ?  ? PT SHORT TERM GOAL #2  ? Title Pt will decrease 5 x sit to stand from 17.96 sec to<15 sec with improved control for improved balance and functional strength.   ? Baseline 03/07/21 17.96 sec without hands from chair >  21 seconds without UE from mat   ? Time 4   ? Period Weeks   ? Status Not Met   ? Target Date 04/05/21   ?  ? PT SHORT TERM GOAL #3  ? Title Pt will be able to ambulate 400' on level surfaces with walker mod I for improved household mobility and short community distances.   ? Baseline supervision-min A for safety after longer distances   ? Time 4   ? Period Weeks   ? Status Not Met   ? Target Date 04/05/21   ?  ? PT SHORT TERM GOAL #4  ? Title Pt will increase Berg from 28/56 to >34/56 for improved balance.   ? Baseline 03/07/21 28/56 > 31/56   ? Time 4   ? Period Weeks   ? Status Partially Met   ? Target Date 04/05/21   ? ?  ?  ? ?  ? ? ? PT Long Term Goals - 03/08/21 1220   ? ?  ? PT LONG TERM GOAL #1  ? Title Pt will be independent with progressive HEP for strengthening, balance and aerobic  activity to continue gains on own.   ? Time 8   ? Period Weeks   ? Status New   ? Target Date 05/01/21   ?  ? PT LONG TERM GOAL #2  ? Title Pt will increase gait speed to >0.81ms for improved gait safety.   ? Baseline 03/07/21 0.373m   ? Time 8   ? Period Weeks   ? Status New   ? Target Date 05/04/21   ?  ? PT LONG TERM GOAL #3  ? Title Pt will increase Berg from 28 to >40/56 for improved balance and decreased fall risk.   ? Baseline 03/07/21 28/56   ? Time 8   ? Period Weeks   ? Status New   ? Target Date 05/04/21   ?  ? PT LONG TERM GOAL #4  ? Title Pt will ambulate >500' on varied surfaces with LRAD mod I for improved community access.   ? Time 8   ? Period Weeks   ? Status New   ? Target Date 05/04/21   ? ?  ?  ? ?  ? ? ?Plan - 04/19/21 2017   ?  ?  Clinical Impression Statement Aquatic therapy treatment with held due to elevated BP.  ?  Personal Factors and Comorbidities Comorbidity 3+;Behavior Pattern;Past/Current Experience   ?  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  Examination-Activity Limitations Locomotion Level;Transfers;Stairs;Stand;Dressing;Bend   ?  Examination-Participation Restrictions Meal Prep;Cleaning;Community Activity;[place]   ?  Stability/Clinical Decision Making Evolving/Moderate complexity   ?  Rehab Potential Good   ?  PT Frequency 2x / week   plus eval  ?  PT Duration 8 weeks   ?  PT Treatment/Interventions ADLs/Self Care Home Management;Aquatic Therapy;DME Instruction;Gait training;Stair training;Functional mobility training;Therapeutic activities;Therapeutic exercise;Balance training;Neuromuscular re-education;Manual techniques;Passive range of motion;Vestibular;Patient/family education   ?  PT Next Visit Plan Monitor BP closely.   ? ?Check LTG, recert and request more visits from [facility] Monday, Wed PM or wait until KaJuliann Pulsean do Tuesday morning? ? ?Continue BWS over treadmill as long as BP ok (used large  harness with about 30# unweighted).Will need at least 2nd person on left foot to work on letting go on treadmill. Continue gait training with pt's new rollator. Continue to  work on engaging core with balance activities with slow, controlled movements-sustained squats, tall kneeling and quadruped as shoulder is able to tolerate. Try sitting balance on physioball?   ?  Consulted and Agree with Plan of Care Patient  ? ?Rico Junker, PT, DPT ?05/15/21    1:48 PM ? ? ? ? ? ? ? ?  ? ?

## 2021-05-16 ENCOUNTER — Encounter (HOSPITAL_BASED_OUTPATIENT_CLINIC_OR_DEPARTMENT_OTHER): Payer: Medicare HMO | Admitting: Physical Medicine and Rehabilitation

## 2021-05-16 ENCOUNTER — Telehealth: Payer: Self-pay

## 2021-05-16 DIAGNOSIS — I10 Essential (primary) hypertension: Secondary | ICD-10-CM | POA: Diagnosis not present

## 2021-05-16 DIAGNOSIS — I610 Nontraumatic intracerebral hemorrhage in hemisphere, subcortical: Secondary | ICD-10-CM | POA: Diagnosis not present

## 2021-05-16 NOTE — Telephone Encounter (Signed)
PA for Clonidine faxed to [facility] ?

## 2021-05-17 ENCOUNTER — Ambulatory Visit: Payer: Medicare HMO | Admitting: Physical Therapy

## 2021-05-17 ENCOUNTER — Telehealth: Payer: Self-pay | Admitting: Physical Therapy

## 2021-05-17 NOTE — Telephone Encounter (Signed)
Clonidine 0.2 patch was denied by insurance. I have placed the denial letter in Dr. Aline August box for review. ?

## 2021-05-17 NOTE — Telephone Encounter (Signed)
Pt cancelled today's appointment due to not feeling well but requested call back from PT ? ?PT contacted patient.  Pt states he is feeling well but he wasn't able to pick up his new dosage Clonidine patch until later this afternoon and felt like he would not be able to participate in therapy today due to BP.  Reports he will have new patch next week.   ? ?Rescheduled today's appointment for Tuesday 4/4 for re-assessment and recertification.   ? ?Discussed return to aquatic therapy - discussed safety and would need BP to be controlled before returning to aquatic therapy.  Will monitor BP and will refer back to aquatic when appropriate.  ? ?Rico Junker, PT, DPT ?05/17/21    4:18 PM ? ? ?

## 2021-05-17 NOTE — Progress Notes (Signed)
? ?  Subjective:  ? ? Patient ID: Carlos Rice, male    DOB: [birth year], 51 y.o.   MRN: [id number] ? ?HPI ?An audio/video tele-health visit is felt to be the most appropriate encounter for this patient at this time. This is a follow up tele-visit via phone. The patient is at home. MD is at office. Prior to scheduling this appointment, our staff discussed the limitations of evaluation and management by telemedicine and the availability of in-person appointments. The patient expressed understanding and agreed to proceed.  ? ?Mr. Carlos Rice is a 51 year old man presenting for f/u of ICH, impaired mobility, and HTN ? ?1) ICH ?-started outpatient therapy and has been progressing but feels he will soon be able to progress faster on his own.  ?-he was able to bike 2 miles and his son felt this was too much.  ?-he feels he is walked too slowly with his walker and would like a quad cane. Has been walking slowly with quad cane to avoid falling ?-missed therapy yesterday since his mother did not want to drive in the heavy rain ?-he has been doing core and leg exercises ? ?2) HTN ?-He was having hard time getting clonidine patch covered ?-He placed a 0.1mg  clonidine patch on temporarily until he can pick up the 0.2mg  patch ?-Bp has his clonidine patch had fallen off and he had accidentally replaced it with one of the 0.1mg  patches. He will be more vigilant about checking his blood pressure regularly this week.  ?-he has been eating kiwis, has not been eating nuts ?-he has been trying juicing.  ?-he has run out of his amlodipine ? ? ? ?Review of Systems ?+impaired balance and coordination ?   ?Objective:  ? Physical Exam ?Not performed as seen via phone ? ? ? ? ?   ?Assessment & Plan:  ?1) ICH ?-prescribed quad cane, advised to use walker when alone and quad cane with family ?-discussed his progress with therapy this far ?-continue outpatient therapy ?-commended on bike riding and encouraged exercise ? ?2) HTN ?-encouraged him to  keep blood pressure log to share with me ?-continue clonidine patch to 0.2mg  per week, advised that it will take time for new patch to get into his system and to notify me of his blood pressures this week ?-prescribed amldodipine to use while he is waiting for insurance to cover clonidine 0.2mg  patch again (will take one day) ?-discontinue amlodipine.  ?-recommended eating 1 kiwi daily and adding nuts to diet ?-continue juicing, BP improving ? ?3) Atherosclerosis ?-encouraged coQ10 as recommended by his holistic practitioner. ? ?14 minutes spent in discussion of his blood pressures, difficulty in getting 0.2mg  clonidine patches because of their cost, the fact that his last one fell off early so he was not due for next refill, using amlodipine in mean time ? ?

## 2021-05-22 ENCOUNTER — Ambulatory Visit: Payer: Medicare HMO

## 2021-05-24 ENCOUNTER — Ambulatory Visit: Payer: Medicare HMO

## 2021-05-24 ENCOUNTER — Ambulatory Visit: Payer: Medicare HMO | Attending: Physician Assistant

## 2021-09-27 ENCOUNTER — Telehealth (HOSPITAL_BASED_OUTPATIENT_CLINIC_OR_DEPARTMENT_OTHER): Payer: Medicare HMO | Admitting: Physical Medicine and Rehabilitation

## 2021-09-27 DIAGNOSIS — I610 Nontraumatic intracerebral hemorrhage in hemisphere, subcortical: Secondary | ICD-10-CM | POA: Diagnosis not present

## 2021-09-27 NOTE — Telephone Encounter (Signed)
12 minutes spent in discussion with patient on phone his need for a letter for his son's college since his son had to miss school to help his father, discussion of his current blood pressure with conservative management

## 2022-02-04 ENCOUNTER — Other Ambulatory Visit (HOSPITAL_COMMUNITY): Payer: Self-pay

## 2022-07-23 ENCOUNTER — Other Ambulatory Visit: Payer: Self-pay | Admitting: Physical Medicine and Rehabilitation

## 2023-10-07 ENCOUNTER — Emergency Department (HOSPITAL_COMMUNITY)
Admission: EM | Admit: 2023-10-07 | Discharge: 2023-10-07 | Disposition: A | Discharge status: Home / Self Care | Attending: Student | Admitting: Student

## 2023-10-07 ENCOUNTER — Other Ambulatory Visit: Payer: Self-pay

## 2023-10-07 ENCOUNTER — Emergency Department (HOSPITAL_COMMUNITY)

## 2023-10-07 ENCOUNTER — Encounter (HOSPITAL_COMMUNITY): Payer: Self-pay

## 2023-10-07 DIAGNOSIS — E119 Type 2 diabetes mellitus without complications: Secondary | ICD-10-CM | POA: Diagnosis not present

## 2023-10-07 DIAGNOSIS — M25572 Pain in left ankle and joints of left foot: Secondary | ICD-10-CM | POA: Insufficient documentation

## 2023-10-07 DIAGNOSIS — Z8673 Personal history of transient ischemic attack (TIA), and cerebral infarction without residual deficits: Secondary | ICD-10-CM | POA: Insufficient documentation

## 2023-10-07 DIAGNOSIS — Y9239 Other specified sports and athletic area as the place of occurrence of the external cause: Secondary | ICD-10-CM | POA: Insufficient documentation

## 2023-10-07 DIAGNOSIS — Z79899 Other long term (current) drug therapy: Secondary | ICD-10-CM | POA: Diagnosis not present

## 2023-10-07 DIAGNOSIS — Y9343 Activity, gymnastics: Secondary | ICD-10-CM | POA: Insufficient documentation

## 2023-10-07 DIAGNOSIS — R030 Elevated blood-pressure reading, without diagnosis of hypertension: Secondary | ICD-10-CM

## 2023-10-07 DIAGNOSIS — W208XXA Other cause of strike by thrown, projected or falling object, initial encounter: Secondary | ICD-10-CM | POA: Insufficient documentation

## 2023-10-07 DIAGNOSIS — I10 Essential (primary) hypertension: Secondary | ICD-10-CM | POA: Insufficient documentation

## 2023-10-07 MED ORDER — NAPROXEN 250 MG PO TABS
500.0000 mg | ORAL_TABLET | Freq: Once | ORAL | Status: DC
  Filled 2023-10-07: qty 2

## 2023-10-07 NOTE — ED Triage Notes (Signed)
 Pt was on weight machine and fell on pts left ankle. C/O left ankle pain. No obvious deformities no lacerations.

## 2023-10-07 NOTE — ED Notes (Signed)
 Ortho tech called to place ankle brace.

## 2023-10-07 NOTE — Progress Notes (Signed)
 Orthopedic Tech Progress Note Patient Details:  Carlos Rice 04/21/1970 [id number]  Ortho Devices Type of Ortho Device: Ankle Air splint Ortho Device/Splint Location: LLE Ortho Device/Splint Interventions: Ordered, Application, Adjustment   Post Interventions Patient Tolerated: Well Instructions Provided: Care of device  Carlos Rice Pac 10/07/2023, 4:37 PM

## 2023-10-07 NOTE — ED Notes (Addendum)
 Pt refused any pain medication. Education on elevated blood pressure, denies any medication for BP. Denies any symptoms. Pt states he doesn't take medications and that he is working with a Dentist on getting his BP under control.

## 2023-10-07 NOTE — ED Provider Notes (Signed)
 [place] EMERGENCY DEPARTMENT AT [facility] Provider Note   CSN: [id number] Arrival date & time: 10/07/23  1222     Patient presents with: Ankle Pain   Carlos Rice Reason is a 53 y.o. male.   53 y.o male with a PMH of CVA, diabetes presents to the ED with a chief complaint of left ankle pain for the past 2 days.  Patient reports he was at the gym on Sunday doing a leg presses when suddenly he did not like the machine and the machine fell on most of his left foot.  He reports most of the weight was caught by his left ankle.  He reports pain to the lateral aspect of his ankle exacerbated with any moving.  He is also endorsing some swelling.  Did apply ice and heat without any improvement in his symptoms. He denies any other complaint.   The history is provided by the patient.  Ankle Pain Time since incident:  2 days Injury: yes   Mechanism of injury: fall   Associated symptoms: no fever        Prior to Admission medications   Medication Sig Start Date End Date Taking? Authorizing Provider  acetaminophen  (TYLENOL ) 325 MG tablet Take 1-2 tablets (325-650 mg total) by mouth every 4 (four) hours as needed for mild pain. 02/27/21   Love, Sharlet RAMAN, PA-C  amLODipine  (NORVASC ) 10 MG tablet TAKE 1 TABLET(10 MG) BY MOUTH DAILY 05/09/21   Raulkar, Sven SQUIBB, MD  cloNIDine  (CATAPRES  - DOSED IN MG/24 HR) 0.2 mg/24hr patch Place 1 patch (0.2 mg total) onto the skin once a week. 04/26/21   Raulkar, Sven SQUIBB, MD  cloNIDine  (CATAPRES  - DOSED IN MG/24 HR) 0.2 mg/24hr patch Place 1 patch (0.2 mg total) onto the skin every 7 (seven) days. 05/15/21 05/15/22  Lorilee Sven SQUIBB, MD  magnesium  oxide (MAG-OX) 400 MG tablet Take 1 tablet (400 mg total) by mouth daily. 03/01/21   Angiulli, Toribio PARAS, PA-C    Allergies: Patient has no known allergies.    Review of Systems  Constitutional:  Negative for fever.  HENT:  Negative for sore throat.   Respiratory:  Negative for shortness of breath.    Cardiovascular:  Negative for chest pain.  Gastrointestinal:  Negative for abdominal pain, nausea and vomiting.  Musculoskeletal:  Positive for arthralgias.  All other systems reviewed and are negative.   Updated Vital Signs BP (!) 231/105 (BP Location: Left Arm)   Pulse 69   Temp 98.3 F (36.8 C) (Oral)   Resp 18   Ht 5' 8 (1.727 m)   Wt 73.9 kg   SpO2 100%   BMI 24.78 kg/m   Physical Exam Vitals and nursing note reviewed.  Constitutional:      Appearance: Normal appearance.  HENT:     Head: Normocephalic and atraumatic.     Mouth/Throat:     Mouth: Mucous membranes are moist.  Cardiovascular:     Rate and Rhythm: Normal rate.     Pulses:          Dorsalis pedis pulses are 2+ on the left side.       Posterior tibial pulses are 2+ on the left side.  Pulmonary:     Effort: Pulmonary effort is normal.  Abdominal:     General: Abdomen is flat.  Musculoskeletal:        General: Tenderness present.     Cervical back: Normal range of motion and neck supple.  Left ankle: Swelling present. Tenderness present over the lateral malleolus.     Comments: 2+DP,PT symmetric, brisk capillary refill, good plantarflexion and dorsiflexion. Ttp along the lateral malleolus.   Skin:    General: Skin is warm and dry.  Neurological:     Mental Status: He is alert and oriented to person, place, and time.     (all labs ordered are listed, but only abnormal results are displayed) Labs Reviewed - No data to display  EKG: None  Radiology: DG Ankle Complete Left Result Date: 10/07/2023 CLINICAL DATA:  Ankle pain. EXAM: LEFT ANKLE COMPLETE - 3+ VIEW COMPARISON:  None Available. FINDINGS: There is diffuse osteopenia of the visualized osseous structures. No acute fracture or dislocation. No aggressive osseous lesion. Ankle mortise appears intact. Moderate degenerative changes of imaged joints. Calcaneal spur noted along the Achilles tendon attachment site. No focal soft tissue swelling.  Extensive vascular calcifications noted. No radiopaque foreign bodies. IMPRESSION: No acute osseous abnormality of the left ankle joint. Electronically Signed   By: Ree Molt M.D.   On: 10/07/2023 13:54     Procedures   Medications Ordered in the ED  naproxen  (NAPROSYN ) tablet 500 mg (has no administration in time range)                                    Medical Decision Making Amount and/or Complexity of Data Reviewed Radiology: ordered.  Risk Prescription drug management.    Patient resents to the ED with a chief complaint of left ankle pain for the past 2 days after injury at the gym.  Worsening pain along the lateral malleolus, does have some swelling on exam, neurovascularly intact otherwise.  X-ray is negative for any acute dislocation or fracture at this time.  Suspect of strain versus sprain.  We discussed supportive therapy with anti-inflammatories, brace, follow-up with orthopedics.  Patient is pretty hypertensive here, he does have underlying history of high blood pressure.  I personally retook patients vitals with blood pressure being 231/117, he tells me he has no headache, no chest pain or SOB. He  states  we are not gonna go there, I will not be taking blood pressure medication.  He was offered labs along with a workup but he is refusing this at this time. States I am working on improvement my blood pressure just not with medication.  After review of his records, I do see that he was previously prescribed Norvasc , clonidine  but non compliant with any of it.   Placed on ankle splint, supportive therapy and will follow up outpatient. Stable for disposition.    Portions of this note were generated with Scientist, clinical (histocompatibility and immunogenetics). Dictation errors may occur despite best attempts at proofreading.   Final diagnoses:  Acute left ankle pain  Elevated blood pressure reading    ED Discharge Orders     None          Maren Wiesen, PA-C 10/07/23 1558     Kommor, [place], MD 10/07/23 [phone]

## 2023-10-07 NOTE — Discharge Instructions (Addendum)
 Your x-ray on today's visit did not show any acute findings.  Blood pressure was elevated today but seeing as you did not have any symptoms you refused any further workup or treatment of your elevated blood pressure.  Follow-up with orthopedics if your symptoms do not improve of your left ankle pain.
